# Patient Record
Sex: Female | Born: 1955 | Race: White | Hispanic: No | Smoking: Never smoker
Health system: Southern US, Community
[De-identification: ages and names within clinical notes are randomized; demographics above are authoritative.]

## PROBLEM LIST (undated history)

## (undated) DIAGNOSIS — R011 Cardiac murmur, unspecified: Secondary | ICD-10-CM

## (undated) DIAGNOSIS — L719 Rosacea, unspecified: Secondary | ICD-10-CM

## (undated) DIAGNOSIS — I7121 Aneurysm of the ascending aorta, without rupture: Secondary | ICD-10-CM

## (undated) DIAGNOSIS — I10 Essential (primary) hypertension: Secondary | ICD-10-CM

## (undated) DIAGNOSIS — Z9889 Other specified postprocedural states: Secondary | ICD-10-CM

## (undated) DIAGNOSIS — L4050 Arthropathic psoriasis, unspecified: Secondary | ICD-10-CM

## (undated) DIAGNOSIS — K219 Gastro-esophageal reflux disease without esophagitis: Secondary | ICD-10-CM

## (undated) DIAGNOSIS — G5603 Carpal tunnel syndrome, bilateral upper limbs: Secondary | ICD-10-CM

## (undated) DIAGNOSIS — I712 Thoracic aortic aneurysm, without rupture: Secondary | ICD-10-CM

## (undated) DIAGNOSIS — N6320 Unspecified lump in the left breast, unspecified quadrant: Secondary | ICD-10-CM

## (undated) DIAGNOSIS — I517 Cardiomegaly: Secondary | ICD-10-CM

## (undated) DIAGNOSIS — R112 Nausea with vomiting, unspecified: Secondary | ICD-10-CM

## (undated) DIAGNOSIS — E669 Obesity, unspecified: Secondary | ICD-10-CM

## (undated) HISTORY — DX: Rosacea, unspecified: L71.9

## (undated) HISTORY — DX: Essential (primary) hypertension: I10

## (undated) HISTORY — PX: BREAST EXCISIONAL BIOPSY: SUR124

## (undated) HISTORY — DX: Obesity, unspecified: E66.9

## (undated) HISTORY — DX: Gastro-esophageal reflux disease without esophagitis: K21.9

## (undated) HISTORY — PX: LAPAROSCOPIC ENDOMETRIOSIS FULGURATION: SUR769

## (undated) HISTORY — DX: Cardiomegaly: I51.7

## (undated) HISTORY — PX: PELVIC LAPAROSCOPY: SHX162

---

## 1998-04-05 ENCOUNTER — Ambulatory Visit (HOSPITAL_COMMUNITY): Admission: RE | Admit: 1998-04-05 | Discharge: 1998-04-05 | Payer: Self-pay | Admitting: *Deleted

## 1998-07-16 ENCOUNTER — Ambulatory Visit (HOSPITAL_COMMUNITY): Admission: RE | Admit: 1998-07-16 | Discharge: 1998-07-16 | Payer: Self-pay | Admitting: Internal Medicine

## 1999-12-17 ENCOUNTER — Other Ambulatory Visit: Admission: RE | Admit: 1999-12-17 | Discharge: 1999-12-17 | Payer: Self-pay | Admitting: Obstetrics and Gynecology

## 2000-07-02 ENCOUNTER — Encounter: Payer: Self-pay | Admitting: Internal Medicine

## 2000-07-02 ENCOUNTER — Ambulatory Visit (HOSPITAL_COMMUNITY): Admission: RE | Admit: 2000-07-02 | Discharge: 2000-07-02 | Payer: Self-pay | Admitting: Internal Medicine

## 2001-06-29 ENCOUNTER — Other Ambulatory Visit: Admission: RE | Admit: 2001-06-29 | Discharge: 2001-06-29 | Payer: Self-pay | Admitting: Obstetrics and Gynecology

## 2003-02-19 ENCOUNTER — Encounter: Payer: Self-pay | Admitting: Internal Medicine

## 2003-02-19 ENCOUNTER — Ambulatory Visit (HOSPITAL_COMMUNITY): Admission: RE | Admit: 2003-02-19 | Discharge: 2003-02-19 | Payer: Self-pay | Admitting: Internal Medicine

## 2003-02-21 ENCOUNTER — Other Ambulatory Visit: Admission: RE | Admit: 2003-02-21 | Discharge: 2003-02-21 | Payer: Self-pay | Admitting: Obstetrics and Gynecology

## 2004-02-11 ENCOUNTER — Other Ambulatory Visit: Admission: RE | Admit: 2004-02-11 | Discharge: 2004-02-11 | Payer: Self-pay | Admitting: Obstetrics and Gynecology

## 2005-03-19 ENCOUNTER — Ambulatory Visit (HOSPITAL_COMMUNITY): Admission: RE | Admit: 2005-03-19 | Discharge: 2005-03-19 | Payer: Self-pay | Admitting: Obstetrics and Gynecology

## 2006-08-10 ENCOUNTER — Ambulatory Visit (HOSPITAL_COMMUNITY): Admission: RE | Admit: 2006-08-10 | Discharge: 2006-08-10 | Payer: Self-pay | Admitting: Obstetrics and Gynecology

## 2006-08-23 ENCOUNTER — Other Ambulatory Visit: Admission: RE | Admit: 2006-08-23 | Discharge: 2006-08-23 | Payer: Self-pay | Admitting: Obstetrics and Gynecology

## 2008-07-02 ENCOUNTER — Ambulatory Visit (HOSPITAL_COMMUNITY): Admission: RE | Admit: 2008-07-02 | Discharge: 2008-07-02 | Payer: Self-pay | Admitting: Obstetrics and Gynecology

## 2009-01-22 ENCOUNTER — Encounter (INDEPENDENT_AMBULATORY_CARE_PROVIDER_SITE_OTHER): Payer: Self-pay | Admitting: *Deleted

## 2009-02-11 ENCOUNTER — Ambulatory Visit: Payer: Self-pay | Admitting: Family Medicine

## 2009-02-11 DIAGNOSIS — R519 Headache, unspecified: Secondary | ICD-10-CM | POA: Insufficient documentation

## 2009-02-11 DIAGNOSIS — R51 Headache: Secondary | ICD-10-CM | POA: Insufficient documentation

## 2009-02-11 DIAGNOSIS — R109 Unspecified abdominal pain: Secondary | ICD-10-CM | POA: Insufficient documentation

## 2009-02-11 DIAGNOSIS — J309 Allergic rhinitis, unspecified: Secondary | ICD-10-CM | POA: Insufficient documentation

## 2009-02-14 ENCOUNTER — Telehealth (INDEPENDENT_AMBULATORY_CARE_PROVIDER_SITE_OTHER): Payer: Self-pay | Admitting: *Deleted

## 2009-02-14 LAB — CONVERTED CEMR LAB
ALT: 43 units/L — ABNORMAL HIGH (ref 0–35)
AST: 40 units/L — ABNORMAL HIGH (ref 0–37)
Albumin: 3.9 g/dL (ref 3.5–5.2)
Alkaline Phosphatase: 89 units/L (ref 39–117)
BUN: 17 mg/dL (ref 6–23)
Basophils Absolute: 0 10*3/uL (ref 0.0–0.1)
Basophils Relative: 0.1 % (ref 0.0–3.0)
Bilirubin, Direct: 0.1 mg/dL (ref 0.0–0.3)
CO2: 30 meq/L (ref 19–32)
Calcium: 9.3 mg/dL (ref 8.4–10.5)
Chloride: 108 meq/L (ref 96–112)
Cholesterol: 167 mg/dL (ref 0–200)
Creatinine, Ser: 0.7 mg/dL (ref 0.4–1.2)
Eosinophils Absolute: 0.5 10*3/uL (ref 0.0–0.7)
Eosinophils Relative: 6.5 % — ABNORMAL HIGH (ref 0.0–5.0)
GFR calc non Af Amer: 93.1 mL/min (ref >60)
Glucose, Bld: 88 mg/dL (ref 70–99)
HCT: 37.5 % (ref 36.0–46.0)
HDL: 47.7 mg/dL (ref >39.00)
Hemoglobin: 13 g/dL (ref 12.0–15.0)
LDL Cholesterol: 108 mg/dL — ABNORMAL HIGH (ref 0–99)
Lymphocytes Relative: 29 % (ref 12.0–46.0)
Lymphs Abs: 2.1 10*3/uL (ref 0.7–4.0)
MCHC: 34.7 g/dL (ref 30.0–36.0)
MCV: 90 fL (ref 78.0–100.0)
Monocytes Absolute: 0.5 10*3/uL (ref 0.1–1.0)
Monocytes Relative: 6.3 % (ref 3.0–12.0)
Neutro Abs: 4.1 10*3/uL (ref 1.4–7.7)
Neutrophils Relative %: 58.1 % (ref 43.0–77.0)
Platelets: 262 10*3/uL (ref 150.0–400.0)
Potassium: 4 meq/L (ref 3.5–5.1)
RBC: 4.17 M/uL (ref 3.87–5.11)
RDW: 13.2 % (ref 11.5–14.6)
Sodium: 143 meq/L (ref 135–145)
TSH: 2.98 microintl units/mL (ref 0.35–5.50)
Total Bilirubin: 0.8 mg/dL (ref 0.3–1.2)
Total CHOL/HDL Ratio: 4
Total Protein: 7.7 g/dL (ref 6.0–8.3)
Triglycerides: 59 mg/dL (ref 0.0–149.0)
VLDL: 11.8 mg/dL (ref 0.0–40.0)
WBC: 7.2 10*3/uL (ref 4.5–10.5)

## 2009-03-14 ENCOUNTER — Ambulatory Visit: Payer: Self-pay | Admitting: Family Medicine

## 2009-03-14 DIAGNOSIS — R7402 Elevation of levels of lactic acid dehydrogenase (LDH): Secondary | ICD-10-CM | POA: Insufficient documentation

## 2009-03-14 DIAGNOSIS — R740 Nonspecific elevation of levels of transaminase and lactic acid dehydrogenase [LDH]: Secondary | ICD-10-CM

## 2009-03-14 DIAGNOSIS — R7401 Elevation of levels of liver transaminase levels: Secondary | ICD-10-CM | POA: Insufficient documentation

## 2009-03-25 ENCOUNTER — Ambulatory Visit: Payer: Self-pay | Admitting: Internal Medicine

## 2009-03-25 DIAGNOSIS — M545 Low back pain, unspecified: Secondary | ICD-10-CM | POA: Insufficient documentation

## 2009-04-04 ENCOUNTER — Encounter: Payer: Self-pay | Admitting: Family Medicine

## 2009-05-15 ENCOUNTER — Ambulatory Visit: Payer: Self-pay | Admitting: Family Medicine

## 2009-05-15 LAB — CONVERTED CEMR LAB
ALT: 39 units/L — ABNORMAL HIGH (ref 0–35)
AST: 38 units/L — ABNORMAL HIGH (ref 0–37)
Albumin: 3.7 g/dL (ref 3.5–5.2)
Alkaline Phosphatase: 86 units/L (ref 39–117)
Bilirubin, Direct: 0.1 mg/dL (ref 0.0–0.3)
Total Bilirubin: 0.7 mg/dL (ref 0.3–1.2)
Total Protein: 7.5 g/dL (ref 6.0–8.3)

## 2009-05-16 ENCOUNTER — Encounter (INDEPENDENT_AMBULATORY_CARE_PROVIDER_SITE_OTHER): Payer: Self-pay | Admitting: *Deleted

## 2009-05-22 ENCOUNTER — Ambulatory Visit: Payer: Self-pay | Admitting: Internal Medicine

## 2009-06-06 ENCOUNTER — Encounter: Payer: Self-pay | Admitting: Internal Medicine

## 2009-06-06 ENCOUNTER — Ambulatory Visit: Payer: Self-pay | Admitting: Internal Medicine

## 2009-06-06 LAB — HM COLONOSCOPY

## 2009-06-07 ENCOUNTER — Encounter: Payer: Self-pay | Admitting: Internal Medicine

## 2010-04-01 ENCOUNTER — Telehealth (INDEPENDENT_AMBULATORY_CARE_PROVIDER_SITE_OTHER): Payer: Self-pay | Admitting: *Deleted

## 2010-05-01 ENCOUNTER — Ambulatory Visit: Payer: Self-pay | Admitting: Family Medicine

## 2010-05-01 DIAGNOSIS — I10 Essential (primary) hypertension: Secondary | ICD-10-CM | POA: Insufficient documentation

## 2010-05-06 LAB — CONVERTED CEMR LAB
BUN: 19 mg/dL (ref 6–23)
CO2: 28 meq/L (ref 19–32)
Calcium: 9.3 mg/dL (ref 8.4–10.5)
Chloride: 105 meq/L (ref 96–112)
Creatinine, Ser: 0.9 mg/dL (ref 0.4–1.2)
GFR calc non Af Amer: 72.1 mL/min (ref >60)
Glucose, Bld: 131 mg/dL — ABNORMAL HIGH (ref 70–99)
Potassium: 4.1 meq/L (ref 3.5–5.1)
Sodium: 141 meq/L (ref 135–145)

## 2010-06-11 ENCOUNTER — Ambulatory Visit: Payer: Self-pay | Admitting: Family Medicine

## 2010-06-11 LAB — CONVERTED CEMR LAB
ALT: 39 units/L — ABNORMAL HIGH (ref 0–35)
AST: 33 units/L (ref 0–37)
Albumin: 3.8 g/dL (ref 3.5–5.2)
Alkaline Phosphatase: 75 units/L (ref 39–117)
BUN: 18 mg/dL (ref 6–23)
Basophils Absolute: 0.1 10*3/uL (ref 0.0–0.1)
Basophils Relative: 0.9 % (ref 0.0–3.0)
Bilirubin, Direct: 0.1 mg/dL (ref 0.0–0.3)
CO2: 27 meq/L (ref 19–32)
Calcium: 9.6 mg/dL (ref 8.4–10.5)
Chloride: 110 meq/L (ref 96–112)
Cholesterol: 161 mg/dL (ref 0–200)
Creatinine, Ser: 0.8 mg/dL (ref 0.4–1.2)
Eosinophils Absolute: 0.2 10*3/uL (ref 0.0–0.7)
Eosinophils Relative: 3.7 % (ref 0.0–5.0)
GFR calc non Af Amer: 82.98 mL/min (ref >60)
Glucose, Bld: 100 mg/dL — ABNORMAL HIGH (ref 70–99)
HCT: 37.2 % (ref 36.0–46.0)
HDL: 39.9 mg/dL (ref >39.00)
Hemoglobin: 12.8 g/dL (ref 12.0–15.0)
LDL Cholesterol: 106 mg/dL — ABNORMAL HIGH (ref 0–99)
Lymphocytes Relative: 21.7 % (ref 12.0–46.0)
Lymphs Abs: 1.5 10*3/uL (ref 0.7–4.0)
MCHC: 34.6 g/dL (ref 30.0–36.0)
MCV: 91.7 fL (ref 78.0–100.0)
Monocytes Absolute: 0.4 10*3/uL (ref 0.1–1.0)
Monocytes Relative: 6.7 % (ref 3.0–12.0)
Neutro Abs: 4.5 10*3/uL (ref 1.4–7.7)
Neutrophils Relative %: 67 % (ref 43.0–77.0)
Platelets: 252 10*3/uL (ref 150.0–400.0)
Potassium: 4.6 meq/L (ref 3.5–5.1)
RBC: 4.05 M/uL (ref 3.87–5.11)
RDW: 13.8 % (ref 11.5–14.6)
Sodium: 143 meq/L (ref 135–145)
TSH: 2.12 microintl units/mL (ref 0.35–5.50)
Total Bilirubin: 0.4 mg/dL (ref 0.3–1.2)
Total CHOL/HDL Ratio: 4
Total Protein: 6.9 g/dL (ref 6.0–8.3)
Triglycerides: 76 mg/dL (ref 0.0–149.0)
VLDL: 15.2 mg/dL (ref 0.0–40.0)
WBC: 6.7 10*3/uL (ref 4.5–10.5)

## 2010-06-12 LAB — CONVERTED CEMR LAB: Vit D, 25-Hydroxy: 33 ng/mL (ref 30–89)

## 2010-10-30 NOTE — Progress Notes (Signed)
Summary: fluticasone refill   Phone Note Refill Request Call back at [phone] Message from:  Pharmacy on April 01, 2010 4:21 PM  Refills Requested: Medication #1:  FLUTICASONE PROPIONATE 50 MCG/ACT  SUSP 2 sprays each nostril once daily   Dosage confirmed as above?Dosage Confirmed   Supply Requested: 1 month [facility]  Next Appointment Scheduled: NONE Initial call taken by: Lavell Islam,  April 01, 2010 4:22 PM    Prescriptions: FLUTICASONE PROPIONATE 50 MCG/ACT  SUSP (FLUTICASONE PROPIONATE) 2 sprays each nostril once daily  #1 x 3   Entered by:   Doristine Devoid   Authorized by:   Neena Rhymes MD   Signed by:   Doristine Devoid on 04/01/2010   Method used:   Electronically to        [facility]* (retail)       [address]       [address] Shipping/mailing       [place], [place]  [address]       Ph: [phone]       Fax: [phone]   RxID:   [id number]

## 2010-10-30 NOTE — Assessment & Plan Note (Signed)
Summary: ONE MTH FU ON BP/KDC   Vital Signs:  Patient profile:   55 year old female Weight:      299.4 pounds Pulse rate:   62 / minute BP sitting:   134 / 82  (left arm)  Vitals Entered By: Doristine Devoid (May 15, 2009 9:47 AM) CC: 1 month roa    History of Present Illness: 55 yo woman here today for f/u on elevated BP.  has not been checking BP at home.  has started exercising regularly- 'i feel better but haven't lost any weight'.  no CP, SOB, HAs, visual changes, edema.  elevated LFTs- due for recheck.  Allergies (verified): No Known Drug Allergies  Review of Systems      See HPI  Physical Exam  General:  in no acute distress; alert,appropriate and cooperative throughout examination Neck:  No deformities, masses, or tenderness noted. Lungs:  Normal respiratory effort, chest expands symmetrically. Lungs are clear to auscultation, no crackles or wheezes. Heart:  Normal rate and regular rhythm. S1 and S2 normal without gallop, murmur, click, rub or other extra sounds. Pulses:  +2 carotid, radial, DP Extremities:  No clubbing, cyanosis, edema    Impression & Recommendations:  Problem # 1:  ELEVATED BP W/O HYPERTENSION (ICD-796.2) Assessment Improved BP improved over last 2 visits.  pt again would like to hold off on meds if possible.  pt to check BP at home and report if consistently higher than 140/90.  Pt expresses understanding and is in agreement w/ this plan.  Problem # 2:  NONSPEC ELEVATION OF LEVELS OF TRANSAMINASE/LDH (ICD-790.4) Assessment: Unchanged recheck LFTs. Orders: Venipuncture (14782) TLB-Hepatic/Liver Function Pnl (80076-HEPATIC)  Complete Medication List: 1)  Westcort 0.2 % Oint (Hydrocortisone valerate) .... As needed 2)  Pepcid 20 Mg Tabs (Famotidine) .... Take 1 tab once daily 3)  Claritin 10 Mg Tabs (Loratadine) .... Use as directed 4)  Centrum Silver Tabs (Multiple vitamins-minerals) .... Use as directed 5)  Fluticasone Propionate 50  Mcg/act Susp (Fluticasone propionate) .... 2 sprays each nostril once daily 6)  Dicyclomine Hcl 20 Mg Tabs (Dicyclomine hcl) .Marland Kitchen.. 1 tab by mouth three times a day as needed for cramping  Patient Instructions: 1)  Please schedule a follow-up appointment in 3 months to recheck blood pressure 2)  Keep up the good work on diet and exercise- this will definitely pay off!!! 3)  Please check your BP  ~2x/week and record this- if consistently higher 140/90 call me! 4)  Call with any questions or concerns 5)  Happy Labor Day!

## 2010-10-30 NOTE — Consult Note (Signed)
Summary: [facility]   Imported By: Lanelle Bal 04/09/2009 13:18:21  _____________________________________________________________________  External Attachment:    Type:   Image     Comment:   External Document

## 2010-10-30 NOTE — Assessment & Plan Note (Signed)
Summary: rto bp was up at work/cbs   Vital Signs:  Patient profile:   55 year old female Height:      66 inches Weight:      313 pounds BMI:     50.70 Pulse rate:   94 / minute BP sitting:   160 / 100  (left arm)  Vitals Entered By: Doristine Devoid CMA (May 01, 2010 1:10 PM) CC: elevated bp has been around 150's/100    History of Present Illness: 55 yo woman here today for elevated BP.  BP has been running 130-160/90-100 throughout the year.  no CP.  + SOB w/ exertion- somewhat related to deconditioning.  no visual changes, HAs.  + edema of LEs in the evening.  BP- pain started after 9 hr car ride over the weekend.  taking 800mg  ibuprofen three times a day w/ improvement.  has numbness down L leg, more than baseline but improving.  pain started L side, traveled to R and is now centered.  can feel a 'pull' when she touches chin to chest.  not keeping her awake at night but does have AM stiffness.  took muscle relaxers that husband had w/out relief.  good relief w/ heat.  Current Medications (verified): 1)  Westcort 0.2 % Oint (Hydrocortisone Valerate) .... As Needed 2)  Pepcid 20 Mg Tabs (Famotidine) .... Take 1 Tab Once Daily 3)  Claritin 10 Mg Tabs (Loratadine) .... Use As Directed 4)  Centrum Silver  Tabs (Multiple Vitamins-Minerals) .... Use As Directed 5)  Fluticasone Propionate 50 Mcg/act  Susp (Fluticasone Propionate) .... 2 Sprays Each Nostril Once Daily 6)  Dicyclomine Hcl 20 Mg Tabs (Dicyclomine Hcl) .Marland Kitchen.. 1 Tab By Mouth Three Times A Day As Needed For Cramping 7)  Ibuprofen 800 Mg Tabs (Ibuprofen) .... 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  obese Lungs:  Normal respiratory effort, chest expands symmetrically. Lungs are clear to auscultation, no crackles or wheezes. Heart:  Normal rate and regular rhythm. S1 and S2 normal without gallop, murmur, click, rub or other extra sounds. Msk:  + lumbar pain, no pain w/ flexion to 45 degrees, good extension.  (-) SLR Pulses:  +2 carotid, radial, DP Extremities:  No clubbing, cyanosis, edema    Impression & Recommendations:  Problem # 1:  HYPERTENSION, BENIGN ESSENTIAL (ICD-401.1) Assessment New pt officially hypertensive.  start ACE and check BMP.  stressed importance of limited Na, regular exercise.  will follow closely. Her updated medication list for this problem includes:    Lisinopril 10 Mg Tabs (Lisinopril) .Marland Kitchen... Take 1 tab by mouth daily  Orders: Venipuncture (57322) TLB-BMP (Basic Metabolic Panel-BMET) (80048-METABOL) Specimen Handling (02542)  Problem # 2:  LOW BACK PAIN, ACUTE (ICD-724.2) Assessment: Unchanged pt's pain improving since initially started.  continue scheduled NSAIDs.  no red flags on hx or PE Her updated medication list for this problem includes:    Ibuprofen 800 Mg Tabs (Ibuprofen) .Marland Kitchen... Three times a day as needed for back pain  Complete Medication List: 1)  Westcort 0.2 % Oint (Hydrocortisone valerate) .... As needed 2)  Pepcid 20 Mg Tabs (Famotidine) .... Take  1 tab once daily 3)  Claritin 10 Mg Tabs (Loratadine) .... Use as directed 4)  Centrum Silver Tabs (Multiple vitamins-minerals) .... Use as directed 5)  Fluticasone Propionate 50 Mcg/act Susp (Fluticasone propionate) .... 2 sprays each nostril once daily 6)  Dicyclomine Hcl 20 Mg Tabs (Dicyclomine hcl) .Marland Kitchen.. 1 tab by mouth three times a day as needed for cramping 7)  Ibuprofen 800 Mg Tabs (Ibuprofen) .... Three times a day as needed for back pain 8)  Lisinopril 10 Mg Tabs (Lisinopril) .... Take 1 tab by mouth daily  Patient Instructions: 1)  Follow up in 4  weeks for your complete physical- do not eat before this appt 2)  Start the Lisinopril daily 3)  Call with any questions or concerns 4)  Limit your sodium intake- this will improve your blood pressure 5)  Continue your walking- this will help your back and the blood pressure 6)  Continue the ibuprofen as needed.  Call if your pain worsens or symptoms change 7)  Hang in there!! Prescriptions: LISINOPRIL 10 MG  TABS (LISINOPRIL) Take 1 tab by mouth daily  #30 x 3   Entered and Authorized by:   Neena Rhymes MD   Signed by:   Neena Rhymes MD on 05/01/2010   Method used:   Electronically to        [facility]* (retail)       [address]       [address] Shipping/mailing       [place], [place]  [address]       Ph: [phone]       Fax: [phone]   RxID:   [id number]

## 2010-10-30 NOTE — Procedures (Signed)
Summary: Colonoscopy   Colonoscopy  Procedure date:  06/06/2009  Findings:      Location:  [facility].    Procedures Next Due Date:    Colonoscopy: 05/2014 COLONOSCOPY PROCEDURE REPORT  PATIENT:  Leonia, Heatherly  MR#:  [id number] BIRTHDATE:   1955/12/08, 53 yrs. old   GENDER:   female  ENDOSCOPIST:   Wilhemina Bonito. Eda Keys, MD Referred by: Helane Rima. Beverely Low, M.D.  PROCEDURE DATE:  06/06/2009 PROCEDURE:  Colonoscopy with snare polypectomy ASA CLASS:   Class II INDICATIONS: Routine Risk Screening   MEDICATIONS:    Fentanyl 100 mcg IV, Versed 10 mg IV, Benadryl 25 mg IV  DESCRIPTION OF PROCEDURE:   After the risks benefits and alternatives of the procedure were thoroughly explained, informed consent was obtained.  Digital rectal exam was performed and revealed small external hemorrhoids.   The LB CF-[id number] endoscope was introduced through the anus and advanced to the cecum, which was identified by both the appendix and ileocecal valve, without limitations. TIME TO CECUM = 4:05 MIN. The quality of the prep was excellent, using MoviPrep.  The instrument was then withdrawn (TIME = 19:04 MIN) as the colon was fully examined. <<PROCEDUREIMAGES>>            <<OLD IMAGES>>  FINDINGS:  Four polyps were found in the rectum and sigmoid colon. Polyps were snared without cautery. Retrieval was successful. snare polyp  Moderate diverticulosis was found in the sigmoid colon.   Retroflexed views in the rectum revealed internal hemorrhoids.    The scope was then withdrawn from the patient and the procedure completed.  COMPLICATIONS:   None  ENDOSCOPIC IMPRESSION:  1) Four polyps in the rectum and sigmoid colon  2) Moderate diverticulosis in the sigmoid colon  3) Internal hemorrhoids RECOMMENDATIONS:  1) Repeat colonoscopy in 5 years if polyp adenomatous; otherwise 10 years    _______________________________ Wilhemina Bonito. Eda Keys, MD  CC: Sheliah Hatch, MD;   The Patient     REPORT OF SURGICAL PATHOLOGY   Case #: [id number] Patient Name: GRAZIELLA, CONNERY Office Chart Number:  N/A [id number] MRN: [id number] Pathologist: Beulah Gandy. Luisa Hart, MD DOB/Age  30-Jul-1956 (Age: 22)    Gender: F Date Taken:  06/06/2009 Date Received: 06/06/2009   FINAL DIAGNOSIS   ***MICROSCOPIC EXAMINATION AND DIAGNOSIS***   COLON, TRANSVERSE, SIGMOID AND RECTAL POLYPS:  TUBULAR ADENOMA AND THREE HYPERPLASTIC POLYPS.  NO HIGH GRADE DYSPLASIA OR MALIGNANCY IDENTIFIED.   gdt Date Reported:  06/07/2009     Beulah Gandy. Luisa Hart, MD *** Electronically Signed Out By JDP ***    June 07, 2009 MRN: [id number]    [facility] [address] [place], [place]  [address]    Dear Ms. Doyne Keel,  I am pleased to inform you that the colon polyp(s) removed during your recent colonoscopy was (were) found to be benign (no cancer detected) upon pathologic examination.  I recommend you have a repeat colonoscopy examination in 5 years to look for recurrent polyps, as having colon polyps increases your risk for having recurrent polyps or even colon cancer in the future.  Should you develop new or worsening symptoms of abdominal pain, bowel habit changes or bleeding from the rectum or bowels, please schedule an evaluation with either your primary care physician or with me.  Additional information/recommendations:    __ No further action with gastroenterology is needed at this time. Please      follow-up with your primary care physician for your other healthcare  needs.    Please call us if you are having persistent problems or have questions about your condition that have not been fully answered at this time.  Sincerely,  Hilarie Fredrickson MD  This letter has been electronically signed by your physician.   Signed by Hilarie Fredrickson MD on 06/07/2009 at 3:38 PM  This report was created from the original endoscopy report, which was reviewed and signed by  the above listed endoscopist.

## 2010-10-30 NOTE — Progress Notes (Signed)
Summary: discuss labs  Phone Note Outgoing Call   Call placed by: Doristine Devoid,  Feb 14, 2009 4:41 PM Call placed to: Patient Summary of Call: cholesterol and glucose looks good.  LFTs mildly elevated- will recheck in 2-3 weeks to trend.  no reason for concern.  Follow-up for Phone Call        left msg to have patient return call........Marland KitchenDoristine Devoid  Feb 14, 2009 4:41 PM   left message on machine ..............Marland KitchenDoristine Devoid  Feb 19, 2009 9:45 AM   patient aware of results ..........Marland KitchenDoristine Devoid  Feb 22, 2009 9:33 AM

## 2010-10-30 NOTE — Letter (Signed)
Summary: Patient Notice- Polyp Results  [facility]  [address] [place], [place] [address]   Phone: [phone]  Fax: [phone]        June 07, 2009 MRN: [id number]    [facility] [address] [place], [place]  [address]    Dear Ms. Doyne Keel,  I am pleased to inform you that the colon polyp(s) removed during your recent colonoscopy was (were) found to be benign (no cancer detected) upon pathologic examination.  I recommend you have a repeat colonoscopy examination in 5 years to look for recurrent polyps, as having colon polyps increases your risk for having recurrent polyps or even colon cancer in the future.  Should you develop new or worsening symptoms of abdominal pain, bowel habit changes or bleeding from the rectum or bowels, please schedule an evaluation with either your primary care physician or with me.  Additional information/recommendations:    __ No further action with gastroenterology is needed at this time. Please      follow-up with your primary care physician for your other healthcare      needs.    Please call us if you are having persistent problems or have questions about your condition that have not been fully answered at this time.  Sincerely,  Hilarie Fredrickson MD  This letter has been electronically signed by your physician.

## 2010-10-30 NOTE — Miscellaneous (Signed)
Summary: [facility] Previsit/perp  Clinical Lists Changes  Medications: Added new medication of MOVIPREP 100 GM  SOLR (PEG-KCL-NACL-NASULF-NA ASC-C) As per prep instructions. - Signed Rx of MOVIPREP 100 GM  SOLR (PEG-KCL-NACL-NASULF-NA ASC-C) As per prep instructions.;  #1 x 0;  Signed;  Entered by: Wyona Almas RN;  Authorized by: Hilarie Fredrickson MD;  Method used: Electronically to [facility] [address] Shipping/mail[address], [place], [place]  [address], Ph: [phone], Fax: [phone] Observations: Added new observation of NKA: T (05/22/2009 12:57)    Prescriptions: MOVIPREP 100 GM  SOLR (PEG-KCL-NACL-NASULF-NA ASC-C) As per prep instructions.  #1 x 0   Entered by:   Wyona Almas RN   Authorized by:   Hilarie Fredrickson MD   Signed by:   Wyona Almas RN on 05/22/2009   Method used:   Electronically to        [facility]* (retail)       [address]       [address] Shipping/mailing       [place], [place]  [address]       Ph: [phone]       Fax: [phone]   RxID:   [id number]

## 2010-10-30 NOTE — Letter (Signed)
Summary: Results Follow up Letter  [facility]  [address] [place], [place] [address]   Phone: [phone]  Fax: [phone]    05/16/2009 MRN: [id number]  [facility] [address] [place], [place]  [address]  Dear Virginia Matthews,  The following are the results of your recent test(s):  Test         Result    Pap Smear:        Normal _____  Not Normal _____ Comments: ______________________________________________________ Cholesterol: LDL(Bad cholesterol):         Your goal is less than:         HDL (Good cholesterol):       Your goal is more than: Comments:  ______________________________________________________ Mammogram:        Normal _____  Not Normal _____ Comments:  ___________________________________________________________________ Hemoccult:        Normal _____  Not normal _______ Comments:    _____________________________________________________________________ Other Tests: PLEASE SEE ATTACHED LABS DONE ON 05/15/2009    We routinely do not discuss normal results over the telephone.  If you desire a copy of the results, or you have any questions about this information we can discuss them at your next office visit.   Sincerely,

## 2010-10-30 NOTE — Assessment & Plan Note (Signed)
Summary: NEW TO EST/ALR   Vital Signs:  Patient profile:   55 year old female Height:      66 inches Weight:      302 pounds Pulse rate:   80 / minute Resp:     18 per minute BP sitting:   150 / 100  (left arm)  Vitals Entered By: Jeremy Johann CMA (Feb 11, 2009 10:23 AM) CC: cpx,fasting   History of Present Illness: 55 yo woman here today to establish care.  previously went to [facility]- last visit 2+ yrs ago.  GYN- [facility].  elevated BP- pt has never been dx'd w/ HTN in past.  reports this is highest it has been.  does report recent HAs.  not sure if they are BP related. denies CP, SOB, edema, visual changes.  HAs- HAs occur in back of head or behind eyes.  will occasionally wake pt from sleep.  occuring 1-2 times/month.  now somewhat more frequent since allergy season.  no photo or phonophobia.  no nausea or vomiting.  HAs will improve w/ tylenol or ibuprofen.  obesity- pt morbidly obese.  not exercising regularly, not watching diet.  wants to make a change.  allergies- pt using OTC claritin or zyrtec sparingly.  not currently using nasal spray.  occasional eye sxs.  abd pain- pt reports intermittant abd spasm followed by loose stools.  once relieved by BMs cramps are gone.  pt has struggled w/ regular bowels for 'awhile'.  no nausea or vomiting.  no relation to food.      Preventive Screening-Counseling & Management     Alcohol drinks/day: 0     Smoking Status: never     Caffeine use/day: 2 a day     Does Patient Exercise: no      Drug Use:  no.    Allergies (verified): No Known Drug Allergies  Past History:  Past Medical History:    PCO    2 ruptured disks in back L5/S1  Past Surgical History:    ex-lap for endometriosis    large mole removal on hip  Family History:    mom- died at age 4 from breast CA    dad- lymphoma, prostate cancer, CAD, HTN    2 brothers- 1 w/ prostate CA, 1 w/ asthma.  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  The patient denies anorexia, fever, weight loss, vision loss, decreased hearing, hoarseness, chest pain, syncope, dyspnea on exertion, peripheral edema, prolonged cough, abdominal pain, melena, severe indigestion/heartburn, hematuria, muscle weakness, suspicious skin lesions, depression, abnormal bleeding, enlarged lymph nodes, and breast masses.         excessive eye tearing HAs- last 6-12 months more frequent HAs, waking pt up from sleep, <1x/month. blood in stool from hemorrhoids  Physical Exam  General:  obese, NAD Head:  Normocephalic and atraumatic without obvious abnormalities. No apparent alopecia or balding. Eyes:  mild inflammation, some tearing Ears:  retracted TMs bilaterally Nose:  edematous turbinates Mouth:  + PND, otherwise WNL Neck:  No deformities, masses,  or tenderness noted. Breasts:  deferred to gyn Lungs:  Normal respiratory effort, chest expands symmetrically. Lungs are clear to auscultation, no crackles or wheezes. Heart:  Normal rate and regular rhythm. S1 and S2 normal without gallop, murmur, click, rub or other extra sounds. Abdomen:  obese, soft, NT/ND, +BS Genitalia:  deferred to gyn Msk:  No deformity or scoliosis noted of thoracic or lumbar spine.   Pulses:  +2 carotid, radial, DP Extremities:  no C/C/E Neurologic:  No cranial nerve deficits noted. Station and gait are normal. Plantar reflexes are down-going bilaterally. DTRs are symmetrical throughout. Sensory, motor and coordinative functions appear intact. Skin:  Intact without suspicious lesions or rashes Cervical Nodes:  No lymphadenopathy noted Psych:  Cognition and judgment appear intact. Alert and  cooperative with normal attention span and concentration. No apparent delusions, illusions, hallucinations   Impression & Recommendations:  Problem # 1:  HEALTHY ADULT FEMALE (ICD-V70.0) Assessment New pt's PE WNL w/ exception of morbid obesity.  see below.  labs checked.  anticipatory guidance provided. Orders: Venipuncture (62130) TLB-Lipid Panel (80061-LIPID) TLB-BMP (Basic Metabolic Panel-BMET) (80048-METABOL) TLB-CBC Platelet - w/Differential (85025-CBCD) TLB-Hepatic/Liver Function Pnl (80076-HEPATIC) TLB-TSH (Thyroid Stimulating Hormone) (84443-TSH)  Problem # 2:  OBESITY (ICD-278.00) Assessment: New stressed the importance of making changes.  will refer to nutrition.  discussed regular exercise- starting small and progressing as able.  plan is to now see pt frequently and chart progress.  Problem # 3:  ELEVATED BP W/O HYPERTENSION (ICD-796.2) Assessment: New BP elevated- recheck in 3-4 weeks to establish dx of HTN and possibly start meds.  Problem # 4:  RHINITIS (ICD-477.9) Assessment: New pt w/ moderate seasonal allergies- may be cause of HAs.  start nasal steroid  and regular antihistamine use. Her updated medication list for this problem includes:    Claritin 10 Mg Tabs (Loratadine) ..... Use as directed    Fluticasone Propionate 50 Mcg/act Susp (Fluticasone propionate) .Marland Kitchen... 2 sprays each nostril once daily  Problem # 5:  HEADACHE (ICD-784.0) Assessment: New asked pt to keep HA log as she is unable to describe sxs, timing, or related sxs in any detail.  at this point, likely multifactorial- including seasonal allergies.  will revisit at future visit.  reviewed supportive care and red flags that should prompt return.  Pt expresses understanding and is in agreement w/ this plan.  Problem # 6:  ABDOMINAL PAIN, UNSPECIFIED SITE (ICD-789.00) Assessment: New pt describing sxs possibly consistent w/ IBS- abd spasm followed by loose stool and then relief.  start anti-spasmodic  and see if pt finds relief.  stressed importance of high fiber diet and increased water intake.  Pt expresses understanding and is in agreement w/ this plan.  Complete Medication List: 1)  Westcort 0.2 % Oint (Hydrocortisone valerate) .... As needed 2)  Pepcid 20 Mg Tabs (Famotidine) .... Take 1 tab once daily 3)  Claritin 10 Mg Tabs (Loratadine) .... Use as directed 4)  Centrum Silver Tabs (Multiple vitamins-minerals) .... Use as directed 5)  Fluticasone Propionate 50 Mcg/act Susp (Fluticasone propionate) .... 2 sprays each nostril once daily 6)  Dicyclomine Hcl 20 Mg Tabs (Dicyclomine hcl) .Marland Kitchen.. 1 tab by mouth three times a day as needed for cramping  Patient Instructions: 1)  Please schedule a follow-up appointment in 3-4 weeks to recheck BP 2)  We'll notify you of your lab results 3)  Someone will call you with your nutrition referral 4)  Try and make regular exercise part of your routine 5)  Take the  dicyclomine as needed for the abdominal spasm 6)  Use the nasal spray as directed to decrease nasal congestion 7)  Continue OTC Claritin or Zyrtec for watery eyes 8)  Keep a headache diary to see if you can link your symptoms to anything 9)  Call with any questions or concerns 10)  Hang in there!  We'll get you where you want to be! Prescriptions: DICYCLOMINE HCL 20 MG TABS (DICYCLOMINE HCL) 1 tab by mouth three times a day as needed for cramping  #90 x 0   Entered and Authorized by:   Neena Rhymes MD   Signed by:   Neena Rhymes MD on 02/11/2009   Method used:   Print then Give to Patient   RxID:   [id number] FLUTICASONE PROPIONATE 50 MCG/ACT  SUSP (FLUTICASONE PROPIONATE) 2 sprays each nostril once daily  #1 x 3   Entered and Authorized by:   Neena Rhymes MD   Signed by:   Neena Rhymes MD on 02/11/2009   Method used:   Print then Give to Patient   RxID:   [id number]   Appended Document: NEW TO EST/ALR    Clinical Lists Changes  Orders: Added new  Referral order of Nutrition Referral (Nutrition) - Signed

## 2010-10-30 NOTE — Assessment & Plan Note (Signed)
Summary: RTO 4 WEEKS BP CHECK.CBS-CANC ON PHONE TRE  LM   Vital Signs:  Patient profile:   55 year old female Weight:      298.6 pounds BP sitting:   144 / 92  (left arm)  Vitals Entered By: Doristine Devoid (March 14, 2009 9:29 AM) CC: roa bp check    History of Present Illness: 55 yo woman here today to f/u  1) BP- remains elevated.  denies CP, SOB, dizziness, visual changes, N/V.  2) Obesity- has made big changes as of June 2nd.  has given up ice cream, started walking.  goal is 1-2 lbs/week.  3) GI Distress- much improved.  has not had to use Bentyl.  4) HAs- has not had one since last visit  5) elevated LFTs- mildly elevated at last visit.  pt not interested in having them rechecked today, 'i'm not sure that's necessary'.  Allergies (verified): No Known Drug Allergies  Review of Systems General:  Denies fatigue, fever, and malaise. Eyes:  Denies blurring and double vision. CV:  Denies chest pain or discomfort, fainting, fatigue, palpitations, and shortness of breath with exertion. Resp:  Denies chest discomfort, cough, and shortness of breath. GI:  Denies abdominal pain, nausea, and vomiting. Neuro:  Denies headaches.  Physical Exam  General:  obese, NAD Head:  Normocephalic and atraumatic without obvious abnormalities. No apparent alopecia or balding. Eyes:  mild inflammation, some tearing Lungs:  Normal respiratory effort, chest expands symmetrically. Lungs are clear to auscultation, no crackles or wheezes. Heart:  Normal rate and regular rhythm. S1 and S2 normal without gallop, murmur, click, rub or other extra sounds. Pulses:  +2 carotid, radial, DP Extremities:  no C/C/E   Impression & Recommendations:  Problem # 1:  ELEVATED BP W/O HYPERTENSION (ICD-796.2) Assessment Unchanged pt's BP still mildly elevated but pt very hesitant to start meds.  would prefer at least 1 more month of diet and exercise modification prior to starting meds.  pt not in any immediate  danger but did discuss the risks of untreated HTN- including MI,CVA, renal complications.  pt aware but would like to hold off.  will continue to follow.  Problem # 2:  HEADACHE (ICD-784.0) Assessment: Improved no headaches since making healthier food choices and getting regular exercise.  Problem # 3:  ABDOMINAL PAIN, UNSPECIFIED SITE (ICD-789.00) Assessment: Improved pt's abd pain much improved- has not had to use bentyl.  will follow.  Problem # 4:  OBESITY (ICD-278.00) Assessment: Unchanged pt has lost 3.5 lbs.  goal is 2 lbs/week.  pt on track for this.  has upcoming nutrition appt in July.  will continue to encourage and follow closely.  Problem # 5:  NONSPEC ELEVATION OF LEVELS OF TRANSAMINASE/LDH (ICD-790.4) Assessment: New pt's LFT's mildly elevated at last visit.  pt not interested in having these rechecked today.  Complete Medication List: 1)  Westcort 0.2 % Oint (Hydrocortisone valerate) .... As needed 2)  Pepcid 20 Mg Tabs (Famotidine) .... Take 1 tab once daily 3)  Claritin 10 Mg Tabs (Loratadine) .... Use as directed 4)  Centrum Silver Tabs (Multiple vitamins-minerals) .... Use as directed 5)  Fluticasone Propionate 50 Mcg/act Susp (Fluticasone propionate) .... 2 sprays each nostril once daily 6)  Dicyclomine Hcl 20 Mg Tabs (Dicyclomine hcl) .Marland Kitchen.. 1 tab by mouth three times a day as needed for cramping  Patient Instructions: 1)  Please schedule a follow-up appointment in 1 month to recheck BP 2)  Keep up the good work on diet  and exercise- your weight loss is right on track! 3)  Call with any questions or concerns 4)  DO NOT GIVE UP!!

## 2010-10-30 NOTE — Assessment & Plan Note (Signed)
Summary: cpx & lab/cbs   Vital Signs:  Patient profile:   55 year old female Height:      67 inches Weight:      311 pounds Temp:     97.6 degrees F oral Pulse rate:   82 / minute Resp:     18 per minute BP sitting:   120 / 88  (left arm)  Vitals Entered By: Jeremy Johann CMA (June 11, 2010 8:05 AM) CC: cpx, fasting, no pap   History of Present Illness: 55 yo woman here today for CPE.  GYN- [facility]  HTN- much improved since starting Lisinopril.  denies side effects from meds.  no CP, SOB, HAs, visual changes, edema.  health maintainence- UTD on colonoscopy, overdue for pap and mammo- intends to f/u.  Preventive Screening-Counseling & Management  Alcohol-Tobacco     Alcohol drinks/day: <1     Smoking Status: never  Caffeine-Diet-Exercise     Does Patient Exercise: no      Sexual History:  currently monogamous.        Drug Use:  never.    Current Medications (verified): 1)  Westcort 0.2 % Oint (Hydrocortisone Valerate) .... As Needed 2)  Pepcid 20 Mg Tabs (Famotidine) .... Take 1 Tab Once Daily 3)  Claritin 10 Mg Tabs (Loratadine) .... Use As Directed 4)  Centrum Silver  Tabs (Multiple Vitamins-Minerals) .... Use As Directed 5)  Fluticasone Propionate 50 Mcg/act  Susp (Fluticasone Propionate) .... 2 Sprays Each Nostril Once Daily 6)  Dicyclomine Hcl 20 Mg Tabs (Dicyclomine Hcl) .Marland Kitchen.. 1 Tab By Mouth Three Times A Day As Needed For Cramping 7)  Ibuprofen 800 Mg Tabs (Ibuprofen) .... Three Times A Day As Needed For Back Pain 8)  Lisinopril 10 Mg  Tabs (Lisinopril) .... Take 1 Tab By Mouth Daily  Allergies (verified): No Known Drug Allergies  Past History:  Past Medical History: Last updated: 2009-03-02 PCO 2 ruptured disks in back L5/S1  Past Surgical History: Last updated: 03/02/09 ex-lap for endometriosis large mole removal on hip  Family History: Last updated: 02-Mar-2009 mom- died at age 81 from breast CA dad- lymphoma, prostate cancer, CAD,  HTN 2 brothers- 1 w/ prostate CA, 1 w/ asthma.  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    Physical Exam  General:  in no acute distress; alert,appropriate and cooperative throughout examination.  obese Head:  Normocephalic and atraumatic without obvious abnormalities. No apparent alopecia or balding. Eyes:  No corneal or conjunctival inflammation noted. EOMI. Perrla. Funduscopic exam benign, without hemorrhages, exudates or papilledema. Vision grossly normal. Ears:  External ear exam shows no significant lesions or deformities.  Otoscopic examination reveals clear canals, tympanic membranes are intact bilaterally without bulging, retraction, inflammation or discharge. Hearing is grossly normal bilaterally. Nose:  External nasal examination shows no deformity or inflammation. Nasal mucosa are pink and moist without lesions or exudates. Mouth:  Oral mucosa and oropharynx without lesions or exudates.  Teeth in good repair. Neck:  No deformities, masses, or tenderness  noted. Breasts:  deferred to gyn Lungs:  Normal respiratory effort, chest expands symmetrically. Lungs are clear to auscultation, no crackles or wheezes. Heart:  Normal rate and regular rhythm. S1 and S2 normal without gallop, murmur, click, rub or other extra sounds. Abdomen:  obese, soft, NT/ND, +BS Genitalia:   deferred to gyn Pulses:  +2 carotid, radial, DP Extremities:  No clubbing, cyanosis, edema  Neurologic:  No cranial nerve deficits noted. Station and gait are normal. Plantar reflexes are down-going bilaterally. DTRs are symmetrical throughout. Sensory, motor and coordinative functions appear intact. Skin:  Intact without suspicious lesions or rashes Cervical Nodes:  No lymphadenopathy noted Axillary Nodes:  No palpable lymphadenopathy Psych:  Cognition and judgment appear intact. Alert and cooperative with normal attention span and concentration. No apparent delusions, illusions, hallucinations   Impression & Recommendations:  Problem # 1:  PHYSICAL EXAMINATION (ICD-V70.0) Assessment Unchanged PE WNL w/ exception of obesity.  stressed importance of healthy diet and regular exercise.  encouraged pt to call GYN and get UTD on pap and mammogram.  check labs. Orders: TLB-CBC Platelet - w/Differential (85025-CBCD) TLB-TSH (Thyroid Stimulating Hormone) (84443-TSH) T-Vitamin D (25-Hydroxy) (16109-60454) Specimen Handling (09811)  Problem # 2:  HYPERTENSION, BENIGN ESSENTIAL (ICD-401.1) Assessment: Improved BP now well controlled.  continue meds.  recheck BMP.   Her updated medication list for this problem includes:    Lisinopril 10 Mg Tabs (Lisinopril) .Marland Kitchen... Take 1 tab by mouth daily  Orders: Venipuncture (91478) TLB-BMP (Basic Metabolic Panel-BMET) (80048-METABOL) Specimen Handling (29562)  Complete Medication List: 1)  Westcort 0.2 % Oint (Hydrocortisone valerate) .... As needed 2)  Pepcid 20 Mg Tabs (Famotidine) .... Take 1 tab once daily 3)  Claritin 10 Mg Tabs (Loratadine) .... Use as directed 4)  Centrum Silver Tabs (Multiple vitamins-minerals) .... Use as directed 5)  Fluticasone Propionate 50 Mcg/act Susp (Fluticasone propionate) .... 2 sprays each nostril once daily 6)  Dicyclomine Hcl 20 Mg Tabs (Dicyclomine hcl) .Marland Kitchen.. 1 tab by mouth three times a day as needed for  cramping 7)  Ibuprofen 800 Mg Tabs (Ibuprofen) .... Three times a day as needed for back pain 8)  Lisinopril 10 Mg Tabs (Lisinopril) .... Take 1 tab by mouth daily  Other Orders: TLB-Lipid Panel (80061-LIPID) TLB-Hepatic/Liver Function Pnl (80076-HEPATIC)  Patient Instructions: 1)  Follow up in 6 months to recheck blood pressure 2)  We'll notify you of your lab results 3)  Try and get regular exercise- weight loss is your biggest health priority 4)  Schedule your pap and mammogram 5)  Call with any questions or concerns 6)  Good luck w/ Epic! Prescriptions: LISINOPRIL 10 MG  TABS (LISINOPRIL) Take 1 tab by mouth daily  #30 x 3   Entered and Authorized by:   Neena Rhymes MD   Signed by:   Neena Rhymes MD on 06/11/2010   Method used:   Electronically to        [facility]* (retail)       [address]       [address]/mailing       [place], [place]  [address]       Ph: [phone]       Fax: [phone]   RxID:   [id number] FLUTICASONE PROPIONATE 50 MCG/ACT  SUSP (FLUTICASONE PROPIONATE) 2 sprays each nostril once daily  #1 x 6   Entered and Authorized by:   Neena Rhymes MD   Signed by:   Neena Rhymes MD on 06/11/2010  Method used:   Electronically to        [facility]* (retail)       [address]       [address] Shipping/mailing       [place], [place]  [address]       Ph: [phone]       Fax: [phone]   RxID:   [id number]

## 2010-11-08 ENCOUNTER — Encounter: Payer: Self-pay | Admitting: Family Medicine

## 2010-11-28 ENCOUNTER — Ambulatory Visit: Payer: Self-pay | Admitting: Family Medicine

## 2010-11-28 DIAGNOSIS — Z0289 Encounter for other administrative examinations: Secondary | ICD-10-CM

## 2010-12-26 ENCOUNTER — Other Ambulatory Visit: Payer: Self-pay | Admitting: Family Medicine

## 2010-12-26 NOTE — Telephone Encounter (Signed)
#  30 sent, pt is due for follow up.

## 2011-01-19 ENCOUNTER — Ambulatory Visit (INDEPENDENT_AMBULATORY_CARE_PROVIDER_SITE_OTHER): Payer: 59 | Admitting: Family Medicine

## 2011-01-19 ENCOUNTER — Encounter: Payer: Self-pay | Admitting: Family Medicine

## 2011-01-19 DIAGNOSIS — M545 Low back pain, unspecified: Secondary | ICD-10-CM

## 2011-01-19 MED ORDER — OXYCODONE-ACETAMINOPHEN 5-325 MG PO TABS: 1.0000 | ORAL_TABLET | Freq: Four times a day (QID) | ORAL | Status: DC | PRN

## 2011-01-19 MED ORDER — CYCLOBENZAPRINE HCL 10 MG PO TABS: 10.0000 mg | ORAL_TABLET | Freq: Every day | ORAL | Status: DC

## 2011-01-19 MED ORDER — LISINOPRIL 10 MG PO TABS: 10.0000 mg | ORAL_TABLET | Freq: Every day | ORAL | Status: DC

## 2011-01-19 MED ORDER — PREDNISONE (PAK) 10 MG PO TABS: ORAL_TABLET | ORAL | Status: DC

## 2011-01-19 NOTE — Progress Notes (Signed)
  Subjective:    Patient ID: Virginia Matthews, female    DOB: 08-Jan-1956, 55 y.o.   MRN: [id number]  HPI Back pain- sxs started 7-10 days ago.  Related this to fact that knee was bothering her and she was altering her gait.  Most difficult time was transition from sitting to standing and vice versa.  4 days ago developed numbness of R foot and burning pain down the R leg.  This was worse after drive to [place] for funeral.  Unable to get comfortable to sleep.  No pain w/ sitting.  Has been tx'ing pain w/ NSAIDs, flexeril, and heat w/out relief.  Has hx of back problems for 15 yrs.  Took 2 leftover percocet w/ little relief.  Has never seen ortho for this- did see a neurosurg 'years ago' almost had surgery for something similar.   Review of Systems For ROS see HPI     Objective:   Physical Exam  Constitutional: She appears well-developed and well-nourished. No distress.       Morbidly obese  Musculoskeletal:       Lumbar back: She exhibits decreased range of motion, tenderness and spasm.       Back:       Pain w/ extension>flexion, + SLR on R at 90 degrees, (-) on L  Neurological: She has normal reflexes. No cranial nerve deficit. Coordination normal.          Assessment & Plan:

## 2011-01-19 NOTE — Patient Instructions (Signed)
Call me next week and let me know how the pain is Take the Prednisone as directed- take w/ food to avoid upset stomach Take the Percocet as needed for pain/sleep Continue the Flexeril Call with any questions or concerns Hang in there!!!

## 2011-01-19 NOTE — Assessment & Plan Note (Signed)
Pt's back pain now radicular w/ numbness of R foot.  Recommended ortho or neurosurg but pt declines at this time.  Start Predpak to improve inflammation and improve nerve fxn.  Continue muscle relaxer.  Start Percocet for pain and sleep.  Reviewed supportive care and red flags that should prompt return.  Pt expressed understanding and is in agreement w/ plan.

## 2011-01-26 ENCOUNTER — Telehealth: Payer: Self-pay | Admitting: *Deleted

## 2011-01-26 DIAGNOSIS — M545 Low back pain, unspecified: Secondary | ICD-10-CM

## 2011-01-26 NOTE — Telephone Encounter (Signed)
Pt left VM that she was calling to report that pain has improve some but she continues to have numbness in legs and toes with some weakness in legs.

## 2011-01-26 NOTE — Telephone Encounter (Signed)
Given that she's still having numbness and weakness i would again recommend seeing ortho or neurosurg.  If she wants to give it more time for the steroids to work, we can.  But only she knows how she is feeling.

## 2011-01-26 NOTE — Telephone Encounter (Signed)
Pt aware referral put in.

## 2011-02-03 ENCOUNTER — Other Ambulatory Visit (HOSPITAL_COMMUNITY): Payer: Self-pay | Admitting: Neurosurgery

## 2011-02-03 DIAGNOSIS — M5136 Other intervertebral disc degeneration, lumbar region: Secondary | ICD-10-CM

## 2011-02-04 ENCOUNTER — Other Ambulatory Visit: Payer: Self-pay | Admitting: Neurosurgery

## 2011-02-04 ENCOUNTER — Ambulatory Visit (HOSPITAL_COMMUNITY)
Admission: RE | Admit: 2011-02-04 | Discharge: 2011-02-04 | Disposition: A | Discharge status: Home / Self Care | Payer: 59 | Source: Ambulatory Visit | Attending: Neurosurgery | Admitting: Neurosurgery

## 2011-02-04 DIAGNOSIS — M47816 Spondylosis without myelopathy or radiculopathy, lumbar region: Secondary | ICD-10-CM

## 2011-02-04 DIAGNOSIS — M5136 Other intervertebral disc degeneration, lumbar region: Secondary | ICD-10-CM

## 2011-02-04 DIAGNOSIS — IMO0002 Reserved for concepts with insufficient information to code with codable children: Secondary | ICD-10-CM

## 2011-02-05 ENCOUNTER — Inpatient Hospital Stay (HOSPITAL_COMMUNITY): Admission: RE | Admit: 2011-02-05 | Payer: 59 | Source: Ambulatory Visit

## 2011-02-08 ENCOUNTER — Ambulatory Visit
Admission: RE | Admit: 2011-02-08 | Discharge: 2011-02-08 | Disposition: A | Discharge status: Home / Self Care | Payer: 59 | Source: Ambulatory Visit | Attending: Neurosurgery | Admitting: Neurosurgery

## 2011-02-08 DIAGNOSIS — IMO0002 Reserved for concepts with insufficient information to code with codable children: Secondary | ICD-10-CM

## 2011-02-08 DIAGNOSIS — M47816 Spondylosis without myelopathy or radiculopathy, lumbar region: Secondary | ICD-10-CM

## 2011-03-12 ENCOUNTER — Telehealth: Payer: Self-pay | Admitting: Cardiology

## 2011-03-12 ENCOUNTER — Inpatient Hospital Stay (HOSPITAL_COMMUNITY): Payer: 59

## 2011-03-12 ENCOUNTER — Emergency Department (HOSPITAL_BASED_OUTPATIENT_CLINIC_OR_DEPARTMENT_OTHER)
Admission: AD | Admit: 2011-03-12 | Discharge: 2011-03-12 | Disposition: A | Discharge status: Other Acute Inpatient Hospital | Payer: 59 | Source: Other Acute Inpatient Hospital | Attending: Emergency Medicine | Admitting: Emergency Medicine

## 2011-03-12 ENCOUNTER — Emergency Department (INDEPENDENT_AMBULATORY_CARE_PROVIDER_SITE_OTHER): Payer: 59

## 2011-03-12 ENCOUNTER — Observation Stay (HOSPITAL_COMMUNITY)
Admission: AD | Admit: 2011-03-12 | Discharge: 2011-03-12 | Disposition: A | Discharge status: Home / Self Care | Payer: 59 | Source: Other Acute Inpatient Hospital | Attending: Internal Medicine | Admitting: Internal Medicine

## 2011-03-12 DIAGNOSIS — R079 Chest pain, unspecified: Principal | ICD-10-CM | POA: Insufficient documentation

## 2011-03-12 DIAGNOSIS — R0789 Other chest pain: Secondary | ICD-10-CM

## 2011-03-12 DIAGNOSIS — K219 Gastro-esophageal reflux disease without esophagitis: Secondary | ICD-10-CM | POA: Insufficient documentation

## 2011-03-12 DIAGNOSIS — R0602 Shortness of breath: Secondary | ICD-10-CM | POA: Insufficient documentation

## 2011-03-12 DIAGNOSIS — M545 Low back pain, unspecified: Secondary | ICD-10-CM | POA: Insufficient documentation

## 2011-03-12 DIAGNOSIS — L408 Other psoriasis: Secondary | ICD-10-CM | POA: Insufficient documentation

## 2011-03-12 DIAGNOSIS — M25519 Pain in unspecified shoulder: Secondary | ICD-10-CM

## 2011-03-12 DIAGNOSIS — Z79899 Other long term (current) drug therapy: Secondary | ICD-10-CM | POA: Insufficient documentation

## 2011-03-12 DIAGNOSIS — I10 Essential (primary) hypertension: Secondary | ICD-10-CM | POA: Insufficient documentation

## 2011-03-12 LAB — CBC
HCT: 37.6 % (ref 36.0–46.0)
Hemoglobin: 12.6 g/dL (ref 12.0–15.0)
MCH: 30.2 pg (ref 26.0–34.0)
MCHC: 33.5 g/dL (ref 30.0–36.0)
MCV: 90.2 fL (ref 78.0–100.0)
Platelets: 256 10*3/uL (ref 150–400)
RBC: 4.17 MIL/uL (ref 3.87–5.11)
RDW: 13.6 % (ref 11.5–15.5)
WBC: 10.2 10*3/uL (ref 4.0–10.5)

## 2011-03-12 LAB — TROPONIN I
Troponin I: <0.3 ng/mL (ref <0.30)
Troponin I: <0.3 ng/mL (ref <0.30)

## 2011-03-12 LAB — DIFFERENTIAL
Basophils Absolute: 0.1 10*3/uL (ref 0.0–0.1)
Basophils Relative: 1 % (ref 0–1)
Eosinophils Absolute: 0.6 10*3/uL (ref 0.0–0.7)
Eosinophils Relative: 5 % (ref 0–5)
Lymphocytes Relative: 24 % (ref 12–46)
Lymphs Abs: 2.5 10*3/uL (ref 0.7–4.0)
Monocytes Absolute: 0.7 10*3/uL (ref 0.1–1.0)
Monocytes Relative: 7 % (ref 3–12)
Neutro Abs: 6.4 10*3/uL (ref 1.7–7.7)
Neutrophils Relative %: 62 % (ref 43–77)

## 2011-03-12 LAB — COMPREHENSIVE METABOLIC PANEL
ALT: 38 U/L — ABNORMAL HIGH (ref 0–35)
AST: 29 U/L (ref 0–37)
Albumin: 3.7 g/dL (ref 3.5–5.2)
Alkaline Phosphatase: 109 U/L (ref 39–117)
BUN: 15 mg/dL (ref 6–23)
CO2: 27 mEq/L (ref 19–32)
Calcium: 10.2 mg/dL (ref 8.4–10.5)
Chloride: 101 mEq/L (ref 96–112)
Creatinine, Ser: 0.6 mg/dL (ref 0.4–1.2)
GFR calc Af Amer: >60 mL/min (ref >60)
GFR calc non Af Amer: >60 mL/min (ref >60)
Glucose, Bld: 138 mg/dL — ABNORMAL HIGH (ref 70–99)
Potassium: 3.7 mEq/L (ref 3.5–5.1)
Sodium: 140 mEq/L (ref 135–145)
Total Bilirubin: 0.2 mg/dL — ABNORMAL LOW (ref 0.3–1.2)
Total Protein: 7.2 g/dL (ref 6.0–8.3)

## 2011-03-12 LAB — HEMOGLOBIN A1C
Hgb A1c MFr Bld: 6.2 % — ABNORMAL HIGH (ref <5.7)
Mean Plasma Glucose: 131 mg/dL — ABNORMAL HIGH (ref <117)

## 2011-03-12 LAB — D-DIMER, QUANTITATIVE (NOT AT ARMC): D-Dimer, Quant: 0.95 ug/mL-FEU — ABNORMAL HIGH (ref 0.00–0.48)

## 2011-03-12 LAB — TSH: TSH: 1.247 u[IU]/mL (ref 0.350–4.500)

## 2011-03-12 LAB — CK TOTAL AND CKMB (NOT AT ARMC)
CK, MB: 4.7 ng/mL — ABNORMAL HIGH (ref 0.3–4.0)
CK, MB: 4.5 ng/mL — ABNORMAL HIGH (ref 0.3–4.0)
Relative Index: 2.2 (ref 0.0–2.5)
Relative Index: 2.4 (ref 0.0–2.5)
Total CK: 213 U/L — ABNORMAL HIGH (ref 7–177)
Total CK: 190 U/L — ABNORMAL HIGH (ref 7–177)

## 2011-03-12 LAB — CARDIAC PANEL(CRET KIN+CKTOT+MB+TROPI)
CK, MB: 3.8 ng/mL (ref 0.3–4.0)
Relative Index: 2.4 (ref 0.0–2.5)
Total CK: 161 U/L (ref 7–177)
Troponin I: <0.3 ng/mL (ref <0.30)

## 2011-03-12 MED ORDER — IOHEXOL 300 MG/ML  SOLN
100.0000 mL | Freq: Once | INTRAMUSCULAR | Status: AC | PRN
  Administered 2011-03-12: 100 mL via INTRAVENOUS

## 2011-03-12 NOTE — Telephone Encounter (Signed)
[facility]- Patient needs a f/u appointment with Dr. Clifton James either Wednesday, Thursday, or Friday of next week.

## 2011-03-13 ENCOUNTER — Ambulatory Visit (HOSPITAL_COMMUNITY): Payer: 59 | Attending: Cardiovascular Disease | Admitting: Radiology

## 2011-03-13 DIAGNOSIS — I4949 Other premature depolarization: Secondary | ICD-10-CM

## 2011-03-13 DIAGNOSIS — R079 Chest pain, unspecified: Secondary | ICD-10-CM

## 2011-03-13 DIAGNOSIS — R0609 Other forms of dyspnea: Secondary | ICD-10-CM

## 2011-03-13 DIAGNOSIS — R0989 Other specified symptoms and signs involving the circulatory and respiratory systems: Secondary | ICD-10-CM

## 2011-03-13 DIAGNOSIS — R0602 Shortness of breath: Secondary | ICD-10-CM

## 2011-03-13 MED ORDER — TECHNETIUM TC 99M TETROFOSMIN IV KIT
33.0000 | PACK | Freq: Once | INTRAVENOUS | Status: AC | PRN
  Administered 2011-03-13: 33 via INTRAVENOUS

## 2011-03-13 NOTE — Progress Notes (Signed)
[facility] SITE 3 NUCLEAR MED [address] [place] [address] [phone]  Cardiology Nuclear Med Study  Virginia Matthews is a 55 y.o. female [id number] [birth year]   Nuclear Med Background Indication for Stress Test:  Evaluation for Ischemia and 03/12/11 Post Hospital: CP > (-) enzymes History:  6/12 CT Cardiac Risk Factors: Family History - CAD and Hypertension  Symptoms:  Chest Pain (last date of chest discomfort x2 days ago w/ radiation to Left shoulder & down L arm.), DOE, Fatigue and SOB   Nuclear Pre-Procedure Caffeine/Decaff Intake:  None NPO After: 10:00pm   Lungs:  clear IV 0.9% NS with Angio Cath:  20g  IV Site: R Antecubital  IV Started by:  Stanton Kidney, EMT-P  Chest Size (in):  42  Cup Size: DD  Height: 5\' 6"  (1.676 m)  Weight:  324 lb (146.965 kg)  BMI:  Body mass index is 52.29 kg/(m^2). Tech Comments:  NA    Nuclear Med Study 1 or 2 day study: 2 day  Stress Test Type:  Stress  Reading MD: Marca Ancona, MD  Order Authorizing Provider:  C. Clifton James, MD  Resting Radionuclide: Technetium 106m Tetrofosmin  Resting Radionuclide Dose: 33 mCi   Stress Radionuclide:  Technetium 93m Tetrofosmin  Stress Radionuclide Dose: 33 mCi           Stress Protocol Rest HR: 97 Stress HR: 151  Rest BP: 148/88 Stress BP: 182/83  Exercise Time (min): 4:01 METS: 5.9   Predicted Max HR: 166 bpm % Max HR: 90.96 bpm Rate Pressure Product: 21308   Dose of Adenosine (mg):  n/a Dose of Lexiscan: n/a mg  Dose of Atropine (mg): n/a Dose of Dobutamine: n/a mcg/kg/min (at max HR)  Stress Test Technologist: Stanton Kidney, EMT-P  Nuclear Technologist:  Domenic Polite, CNMT     Rest Procedure:  Myocardial perfusion imaging was performed at rest 45 minutes following the intravenous administration of Technetium 39m Tetrofosmin. Rest ECG: NSR - Normal EKG  Stress Procedure:  The patient exercised for 4:01.  The patient stopped due to fatigue and DOE and denied  any chest pain.  There were no significant ST-T wave changes, rare PVC during recovery.  Technetium 28m Tetrofosmin was injected at peak exercise and myocardial perfusion imaging was performed after a brief delay. Stress ECG: No significant change from baseline ECG  QPS Raw Data Images:  Normal; no motion artifact; normal heart/lung ratio. Stress Images:  Small mild apical perfusion defect.  Rest Images:  Small mild apical perfusion defect.  Subtraction (SDS):  Small fixed apical perfusion defect.  Transient Ischemic Dilatation (Normal <1.22):  .87 Lung/Heart Ratio (Normal <0.45):  .31  Quantitative Gated Spect Images QGS EDV:  119 ml QGS ESV:  47 ml QGS cine images:  Normal Wall Motion QGS EF: 61%  Impression Exercise Capacity:  Poor exercise capacity. BP Response:  Hypertensive blood pressure response. Clinical Symptoms:  Shortness of breath, no chest pain.  ECG Impression:  No significant ST segment change suggestive of ischemia. Comparison with Prior Nuclear Study: No previous nuclear study performed  Overall Impression:  Small fixed apical perfusion defect likely represents apical thinning or attenuation.  Normal EF and wall motion, poor exercise tolerance.  No evidence for ischemia or infarction.   Baylen Buckner [employer]

## 2011-03-16 ENCOUNTER — Ambulatory Visit (HOSPITAL_COMMUNITY): Payer: 59 | Attending: Cardiovascular Disease | Admitting: Radiology

## 2011-03-16 DIAGNOSIS — R079 Chest pain, unspecified: Secondary | ICD-10-CM | POA: Insufficient documentation

## 2011-03-16 MED ORDER — TECHNETIUM TC 99M TETROFOSMIN IV KIT
33.0000 | PACK | Freq: Once | INTRAVENOUS | Status: AC | PRN
  Administered 2011-03-16: 33 via INTRAVENOUS

## 2011-03-16 MED ORDER — TECHNETIUM TC 99M TETROFOSMIN IV KIT: 33.0000 | PACK | Freq: Once | INTRAVENOUS | Status: DC | PRN

## 2011-03-17 NOTE — Progress Notes (Signed)
Copy routed to Dr. MCALHANY.Falecha L Clark ° ° °

## 2011-03-19 ENCOUNTER — Encounter: Payer: Self-pay | Admitting: Cardiovascular Disease

## 2011-03-19 ENCOUNTER — Ambulatory Visit (INDEPENDENT_AMBULATORY_CARE_PROVIDER_SITE_OTHER): Payer: 59 | Admitting: Cardiovascular Disease

## 2011-03-19 DIAGNOSIS — R079 Chest pain, unspecified: Secondary | ICD-10-CM | POA: Insufficient documentation

## 2011-03-19 NOTE — Assessment & Plan Note (Signed)
Aytpical pain. Her risk factors are obesity, HTN and family history of CAD. Stress myoview low risk, no ischemia. No further workup. I have recommended weight loss, exercise and aggressive risk factor modification. She will call if she has recurrence of pain.

## 2011-03-19 NOTE — Consult Note (Signed)
NAMEMarland Kitchen  LACARA, DUNSWORTH NO.:  [id number]  MEDICAL RECORD NO.:  [id number]  LOCATION:  2010                         FACILITY:  [facility]  PHYSICIAN:  Verne Carrow, MDDATE OF BIRTH:  11-Apr-1956  DATE OF CONSULTATION:  03/12/2011 DATE OF DISCHARGE:                                CONSULTATION   REQUESTING PHYSICIAN:  Marcellus Scott, MD, of the [facility].  REASON FOR CONSULTATION:  Chest pain, rule out ACS.  HISTORY OF PRESENT ILLNESS:  [name] is a 55 year old woman who presented to the [facility] Emergency Department after awakening from sleep at approximately 1:00 p.m. with left-sided chest pain and shortness of breath.  She stated she woke up suddenly and felt the pain which scared her.  When it got worse, she became more anxious, she decided to go to the emergency department.  On the way to the ED, the pain started to get better and the shortness of breath resolved.  In the emergency department, one sublingual nitroglycerin was given which caused complete resolution of the pain.  Pain was an 8 in the upper left chest with radiation down the left shoulder and down into her arm.  It was associated with some shortness of breath but not diaphoresis.  She has never had anything like this before.  She does have risk factors for CAD including father who had CAD with 90% stenosis of the LAD at age 25. She does have hypertension and is also morbidly obese.  She does deny smoking, hyperlipidemia, and diabetes.  She is followed by Dr. Beverely Low, is at [facility] in [place].  PAST MEDICAL HISTORY: 1. Hypertension. 2. GERD. 3. Psoriasis. 4. Endometriosis.  OUTPATIENT MEDICATION LIST: 1. Lisinopril 10 mg daily. 2. Multivitamin daily. 3. Zantac 150 mg tablet once daily. 4. Claritin 10 mg tablets once daily. 5. Flonase 2 sprays per nostril daily as needed. 6. Naproxen 500 mg b.i.d. as needed.  ALLERGIES:  No known drug  allergies.  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  She is not a smoker, admits to occasional alcohol.  No illicit drug use and no regular exercise.  PAST SURGICAL HISTORY:  She had an exploratory laparotomy for endometriosis and also a large mole removed on her hip.  ADMISSION LABORATORIES:  White count was 10.2.  Hemoglobin was 12.6, hematocrit was 37.6, platelets were 256.  Sodium was 140, potassium is 3.7, chloride was 101, bicarb was 27, BUN was 15, creatinine was 0.6, blood sugar was 138.  Total bilirubin was 0.2, alkaline phosphatase 109, AST 29, ALT 38, calcium is 10.2, total protein was 7.2, albumin was 3.7.  CK-MB was 213, repeat was 190.  CK-MB was 4.7, repeat was 4.5.  Troponin was less than 0.30 x2.  PHYSICAL EXAMINATION:  VITAL SIGNS:  Temperature was 98.3, pulse was 70, respirations 18, blood pressure was 134/84, saturating 90% on room air. GENERAL:  This is an obese pleasant female, in no acute distress. CARDIOVASCULAR:  Regular rate and rhythm.  There is a 3/6 systolic ejection murmur. CHEST:  Respiration was clear to auscultation bilaterally.  No wheezes, crackles, or rhonchi.  ABDOMEN:  Soft, nontender, nondistended.  Bowel sounds are positive. EXTREMITIES:  Trace edema in the bilateral lower extremities.  ASSESSMENT AND PLAN:  Chest pain, currently ruling out for acute coronary syndrome.  An EKG shows no signs of a ST-segment elevation myocardial infarction and negative troponins x2 show acute coronary syndrome is less likely.  Pneumonia, pulmonary embolism, and aortic dissection are also possibilities but with resolution of the pain, no fever or leukocytosis and lack of hypotension make these less likely as well.  RECOMMENDATIONS:  On speaking with Dr. Clifton James, plan is for her to continue her ACS rule out and discharge later this afternoon or early a.m. if  she does continue to rule out and has no return of her chest pain.  We will arrange for an outpatient nuclear stress test at Dr. Gibson Ramp office and follow up in the office next week.  Thank you very much for this consultation.    ______________________________ Leodis Sias, MD   ______________________________ Verne Carrow, MD    CP/MEDQ  D:  03/12/2011  T:  03/12/2011  Job:  [id number]  Electronically Signed by Leodis Sias MD on 03/17/2011 07:55:35 PM Electronically Signed by Verne Carrow MD on 03/19/2011 09:01:52 AM

## 2011-03-19 NOTE — Progress Notes (Signed)
History of Present Illness:54 yo WF with history of HTN, GERD, Psoriasis here today for hospital follow up. I saw her as a new consult on 03/12/11 at [facility] for evaluation of chest pain. She was admitted on 03/11/11 after being awakened with left sided chest pain. Cardiac markers were negative. EKG was normal. D-dimer was elevated but CT angiogram negative for PE. She was discharged home and had an outpatient myoview. She exercised for 4 minutes with no evidence of ischemia, no chest pain. Images with apical thinning possibly from attenuation artifact. EF of 61%. Low risk study. She has had no recurrence of the same type of chest pain. She was digging holes this weekend and had no chest pain.   Past Medical History  Diagnosis Date  . PCO (polycystic ovaries)   . Ruptured lumbar disc     L5/S1  . Hypertension   . GERD (gastroesophageal reflux disease)     Past Surgical History  Procedure Date  . Laparoscopic endometriosis fulguration   . Mole removal     on hip    Current Outpatient Prescriptions  Medication Sig Dispense Refill  . aspirin 325 MG tablet Take 325 mg by mouth daily.        . famotidine (PEPCID) 20 MG tablet Take 20 mg by mouth. Take one tablet daily       . fluticasone (FLONASE) 50 MCG/ACT nasal spray 2 sprays by Nasal route daily.        . hydrocortisone valerate (WEST-CORT) 0.2 % ointment Apply topically. As needed       . lisinopril (PRINIVIL,ZESTRIL) 10 MG tablet Take 1 tablet (10 mg total) by mouth daily.  30 tablet  6  . loratadine (CLARITIN) 10 MG tablet Take 10 mg by mouth daily.        . Multiple Vitamins-Minerals (CENTRUM SILVER PO) Take by mouth. Use as directed       . naproxen (NAPROSYN) 500 MG tablet Take 500 mg by mouth 2 (two) times daily.        . NON FORMULARY Cream for scleriosis       . oxyCODONE-acetaminophen (ROXICET) 5-325 MG per tablet Take 1 tablet by mouth every 6 (six) hours as needed for pain.  45 tablet  0  . DISCONTD: cyclobenzaprine  (FLEXERIL) 10 MG tablet Take 1 tablet (10 mg total) by mouth at bedtime.  30 tablet  0  . DISCONTD: dicyclomine (BENTYL) 20 MG tablet Take 20 mg by mouth. Take one tablet three times a day as needed for cramping       . DISCONTD: ibuprofen (ADVIL,MOTRIN) 800 MG tablet Take 800 mg by mouth. Three times a day as needed for back pain       . DISCONTD: predniSONE (DELTASONE) 10 MG tablet pack Take 12 day pack as directed.  Take w/ food.  Disp 1 pack.  10 tablet  0    No Known Allergies  History   Social History  . Marital Status: Married    Spouse Name: N/A    Number of Children: N/A  . Years of Education: N/A   Occupational History  . Not on file.   Social History Main Topics  . Smoking status: Never Smoker   . Smokeless tobacco: Not on file  . Alcohol Use: No  . Drug Use: No  . Sexually Active:    Other Topics Concern  . Not on file   Social History Narrative  . No narrative on file  Family History  Problem Relation Age of Onset  . Cancer Mother     breast cancer  . Cancer Father     prostate cancer, lymphoma  . Heart disease Father   . Hypertension Father   . Cancer Brother     prostate cancer  . Asthma Brother     Review of Systems:  As stated in the HPI and otherwise negative.   BP 132/92  Pulse 84  Resp 14  Ht 5\' 6"  (1.676 m)  Wt 323 lb (146.512 kg)  BMI 52.13 kg/m2  Physical Examination: General: Well developed, well nourished, NAD HEENT: OP clear, mucus membranes moist SKIN: warm, dry. No rashes. Neuro: No focal deficits Musculoskeletal: Muscle strength 5/5 all ext Psychiatric: Mood and affect normal Neck: No JVD, no carotid bruits, no thyromegaly, no lymphadenopathy. Lungs:Clear bilaterally, no wheezes, rhonci, crackles Cardiovascular: Regular rate and rhythm. No murmurs, gallops or rubs. Abdomen:Soft. Bowel sounds present. Non-tender.  Extremities: No lower extremity edema. Pulses are 2 + in the bilateral DP/PT.  Stress  Myoview  03/16/11:  Stress Procedure: The patient exercised for 4:01. The patient stopped due to fatigue and DOE and denied any chest pain. There were no significant ST-T wave changes, rare PVC during recovery. Technetium 21m Tetrofosmin was injected at peak exercise and myocardial perfusion imaging was performed after a brief delay.  Stress ECG: No significant change from baseline ECG  QPS  Raw Data Images: Normal; no motion artifact; normal heart/lung ratio.  Stress Images: Small mild apical perfusion defect.  Rest Images: Small mild apical perfusion defect.  Subtraction (SDS): Small fixed apical perfusion defect.  Transient Ischemic Dilatation (Normal <1.22): .87  Lung/Heart Ratio (Normal <0.45): .31  Quantitative Gated Spect Images  QGS EDV: 119 ml  QGS ESV: 47 ml  QGS cine images: Normal Wall Motion  QGS EF: 61%  Impression  Exercise Capacity: Poor exercise capacity.  BP Response: Hypertensive blood pressure response.  Clinical Symptoms: Shortness of breath, no chest pain.  ECG Impression: No significant ST segment change suggestive of ischemia.  Comparison with Prior Nuclear Study: No previous nuclear study performed  Overall Impression: Small fixed apical perfusion defect likely represents apical thinning or attenuation. Normal EF and wall motion, poor exercise tolerance. No evidence for ischemia or infarction.

## 2011-03-19 NOTE — Progress Notes (Signed)
Will d/w pt at f/u visit today. cdm

## 2011-03-22 NOTE — H&P (Signed)
NAMEMarland Kitchen  DEBERAH, ADOLF NO.:  [id number]  MEDICAL RECORD NO.:  [id number]  LOCATION:  2010                         FACILITY:  [facility]  PHYSICIAN:  Marcellus Scott, MD     DATE OF BIRTH:  [birth year]  DATE OF ADMISSION:  03/12/2011 DATE OF DISCHARGE:                             HISTORY & PHYSICAL   PRIMARY CARE PHYSICIAN:  Neena Rhymes, MD with [facility] [place]  NEUROSURGERY:  Hewitt Shorts, MD  CHIEF COMPLAINTS:  Chest pain and difficulty breathing.  HISTORY OF PRESENT ILLNESS:  Ms. Doyne Keel is a pleasant 55 year old morbidly obese female patient with history of hypertension, endometriosis, polycystic ovarian syndrome, gastroesophageal reflux disease, and psoriasis.  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  She was in her usual state of health until approximately 12:30 last night when she was awakened by difficulty breathing.  This was not associated with wheezing or cough or fever or chills.  It was, however, associated with precordial aching type of chest discomfort with radiation to the left shoulder and arm.  She had a sense of doom.  There was no diaphoresis or lightheadedness.  This was not associated with nausea, vomiting, or epigastric burning.  She talked to her spouse and decided to go to the emergency room.  Approximately 20 minutes from the start of the event, the symptoms started subsiding but she still had some achiness in the chest.  This subsequently resolved 2 hours later after a sublingual nitroglycerin.  Since then she has had no further chest pain or dyspnea.  She indicates that she traveled to [place] by car at the end of April.  She has some bilateral leg swelling, but no asymmetrical swelling or pain.  Her work is mostly sedentary in nature. [facility] were requested to admit her for further evaluation.  PAST MEDICAL HISTORY: 1. Hypertension. 2. Murmur as  a young adult. 3. Endometriosis. 4. Polycystic ovarian syndrome. 5. Gastroesophageal reflux disease. 6. Psoriasis. 7. L5-S1 bulging disk with low back pain.  PAST SURGICAL HISTORY:  Laparoscopy.  ALLERGIES:  SEASONAL ALLERGIES but no known drug allergies.  HOME MEDICATIONS: 1. Lisinopril 10 mg p.o. daily. 2. Claritin 10 mg p.o. daily. 3. Zantac 150 mg p.o. at bedtime. 4. Flonase 2 sprays per nostril at bedtime. 5. Multivitamins 1 p.o. daily. 6. Cortisone cream and vitamin D cream.  FAMILY HISTORY: 1. The patient's father had a coronary artery bypass graft at age 79     and subsequently 8 stents.  He is alive and is 55 years old.  He     also has prostate cancer and lymphoma. 2. The patient's 1 brother has prostate cancer. 3. The patient's next brother has asthma. 4. The patient's mother died at age 40 from breast cancer.  SOCIAL HISTORY:  The patient is married.  She works as an Designer, multimedia.  She is independent of activities of daily living. There is no history of alcohol, tobacco, or drug abuse.  ADVANCE DIRECTIVES:  Full code.  REVIEW OF SYSTEMS:  All systems reviewed and apart from history of presenting illness are negative.  She has some controlled skin rashes from the  psoriasis on her left knee, lower back.  PHYSICAL EXAMINATION:  GENERAL:  Mr. Doyne Keel is a moderately built and obese female patient who is in no obvious distress. VITAL SIGNS:  Temperature is 98.3 degrees Fahrenheit, pulse 70 per minute regular, respiration 18 per minute, blood pressure 134/84 mmHg, and saturating at 97% on room air. HEENT:  Head nontraumatic, normocephalic.  Pupils equally reacting to light and accommodation and oral mucosa is moist. NECK:  Supple.  No JVD or carotid bruit. LYMPHATICS:  No lymphadenopathy. RESPIRATORY:  Clear.  No increased work of breathing. CARDIOVASCULAR:  First and second heart sounds heard.  Regular rate and rhythm.  No murmurs, rubs,  gallops, or clicks. ABDOMEN:  Obese, nontender, soft.  No organomegaly or masses appreciated.  Bowel sounds normally heard. CENTRAL NERVOUS SYSTEM:  The patient is awake, alert, oriented x3 with no focal neurological deficits. EXTREMITIES:  With 1+ pitting edema, bilateral legs.  No cyanosis, clubbing.  Peripheral pulses are symmetrically felt and power is grade 5/5. SKIN:  The patient has some controlled 1 or 2 spots of psoriasis underneath the left knee and in the mid lower back. MUSCULOSKELETAL:  Negative.  LABORATORY DATA:  Cardiac enzymes x2 show mild elevation in the CK to 213 and CK-MB of 4.7.  Troponins x2 are negative.  Comprehensive metabolic panel significant for glucose 138 and ALT of 38.  CBC within normal limits.  Chest x-ray shows no acute cardiopulmonary disease. Heart size is in the upper limits of normal.  EKG shows normal sinus rhythm at 85 beats per minute with normal axis.  There are no acute ischemic changes.  ASSESSMENT AND PLAN: 1. Chest pain, which is suspicious for angina in this pleasant young     patient with multiple risk factors for coronary artery disease.  We     will rule out coronary acute coronary syndrome.  Admit the patient     to telemetry.  Cycle cardiac enzymes and given her history of     sedentary lifestyle and recent travel, we will also check D-dimer     to rule out venous thromboembolism.  Continue with nitroglycerin     paste.  [facility] has been consulted and they request to     keep her n.p.o. in case they have to do either a stress test or     coronary angiogram. 2. Hypertension.  We will continue her home medications once she is     able to take p.o. 3. Morbid obesity. 4. Gastroesophageal reflux disease, IV PPI. 5. Full code status.  Time taken in coordinating this history and physical note was 1 hour.     Marcellus Scott, MD     AH/MEDQ  D:  03/12/2011  T:  03/12/2011  Job:  [id number]  cc:   Neena Rhymes,  M.D.  Electronically Signed by Marcellus Scott MD on 03/22/2011 01:32:49 AM

## 2011-03-30 NOTE — Discharge Summary (Signed)
NAMEMarland Kitchen  MAAME, DACK NO.:  [id number]  MEDICAL RECORD NO.:  [id number]  LOCATION:  2010                         FACILITY:  [facility]  PHYSICIAN:  Marcellus Scott, MD     DATE OF BIRTH:  19-Feb-1956  DATE OF ADMISSION:  03/12/2011 DATE OF DISCHARGE:  03/12/2011                        DISCHARGE SUMMARY - REFERRING   PRIMARY CARE PHYSICIAN:  Virginia Rhymes, MD with [facility] in [place].  NEUROSURGEON:  Hewitt Shorts, MD  DISCHARGE DIAGNOSES: 1. Chest pain.  Ruled out for acute coronary syndrome.  For outpatient     stress test. 2. Hypertension. 3. Morbid obesity. 4. Gastroesophageal reflux disease. 5. Low back pain. 6. History of psoriasis.  DISCHARGE MEDICATIONS: 1. Enteric-coated aspirin 325 mg p.o. daily. 2. Claritin 10 mg p.o. q.p.m. 3. Clobetasol ointment 0.05% one application topically daily. 4. Finacea 15% gel one application topically daily. 5. Flonase 1 spray nasally daily. 6. Hydrocortisone 0.2% one application topically daily. 7. Lotensin 10 mg p.o. daily. 8. Multivitamins 1 tablet p.o. q.p.m. 9. Naprosyn 500 mg p.o. b.i.d. 10.Vectical 3 mcg ointment one application topically daily. 11.Zantac 150 mg p.o. at bedtime.  IMAGING: 1. CT angiogram of the chest with contrast, impression,     a.     Suboptimal contrast bolus, significantly limiting      sensitivity for segmental and subsegmental pulmonary emboli.  No      definite filling defects are present to suggest pulmonary emboli.     b.     Diffuse fatty infiltration of the liver.     c.     Ill-defined ground-glass attenuation within the medial      aspect of the right lower lobe.  This may represent focal      pneumonia.  This is not typical appearance of atelectasis.      Neoplasm is considered less likely. 2. Chest x-ray on March 12, 2011, impression, no acute cardiopulmonary     disease.  Heart size upper limits of normal.  PERTINENT LABS:  Cardiac  panel cycled and not indicative of acute coronary syndrome.  D-dimer is 0.95.  Comprehensive metabolic panel only significant for glucose 138 and ALT of 38.  CBC within normal limits.  CONSULTATIONS:  Cardiology, Verne Carrow, MD.  DIET:  Heart-healthy diet.  ACTIVITIES:  Ad lib.  COMPLAINTS:  No further chest pain or dyspnea.  PHYSICAL EXAMINATION:  GENERAL:  The patient is in no distress. VITAL SIGNS:  Temperature 97.6 degrees Fahrenheit, pulse 67 per minute, respirations 16 per minute, blood pressure 129/83 mmHg, oxygen saturation of 99% on room air. RESPIRATORY:  Clear. CARDIOVASCULAR:  First and second heart sounds heard regular. ABDOMEN:  Soft and nondistended.  Bowel sounds present. CENTRAL NERVOUS SYSTEM:  The patient is awake, alert, oriented x3 with no focal neurological deficits. EXTREMITIES:  5/5 power.  HOSPITAL COURSE:  Virginia Matthews is a pleasant 55 year old female patient with history of hypertension, gastroesophageal reflux disease, obesity who woke up at approximately midnight last night with difficulty breathing, chest discomfort with radiation to the neck as well as the left arm.  She presented to the emergency department in [facility]. The chest discomfort lasted approximately 2 hours and then resolved with sublingual nitroglycerin.  She was subsequently transferred to [facility] for further evaluation.  She was admitted to telemetry, which did not show any arrhythmia alarms.  Her cardiac enzymes were cycled and negative.  Given her risk factors including strong family history of coronary artery disease, morbid obesity, and the chest pain which was suspicious for angina, Cardiology consultation was requested. Dr. Clifton James kindly provided a consultation and indicated that the chest pain was atypical and recommended that if the third set of cardiac enzymes were negative and D-dimer was negative, then the patient could be discharged home  and his office will arrange for outpatient stress test and followup.  The cardiac enzymes were negative.  EKG did not show any acute changes.  However, the patient had a positive D-dimer.  She had recently traveled to [place] by car at the end of April and has bilateral leg swellings, possibly from edema.  CT angiogram of the chest was done.  It was a suboptimal study, but large pulmonary embolism was ruled out.  Her bilateral lower extremity venous Doppler were performed, which was negative.  DISPOSITION:  The patient is discharged home in stable condition.  FOLLOWUP RECOMMENDATIONS: 1. With the [facility] for a stress test, which has been     arranged by the cardiologist for tomorrow. 2. Outpatient followup with Dr. Verne Carrow.  The     cardiologist office will call for an appointment. 3. Outpatient followup with Dr. Neena Matthews.  The patient is to     call for an appointment.  Time taken in coordinating this discharge is 30 minutes.     Marcellus Scott, MD     AH/MEDQ  D:  03/12/2011  T:  03/12/2011  Job:  [id number]  cc:   Verne Carrow, MD Virginia Matthews, M.D.  Electronically Signed by Marcellus Scott MD on 03/30/2011 11:18:37 PM

## 2011-04-16 ENCOUNTER — Other Ambulatory Visit: Payer: Self-pay | Admitting: Family Medicine

## 2011-04-16 DIAGNOSIS — Z1231 Encounter for screening mammogram for malignant neoplasm of breast: Secondary | ICD-10-CM

## 2011-04-24 ENCOUNTER — Ambulatory Visit (HOSPITAL_COMMUNITY): Payer: 59 | Attending: Family Medicine

## 2011-06-09 DIAGNOSIS — N809 Endometriosis, unspecified: Secondary | ICD-10-CM | POA: Insufficient documentation

## 2011-06-09 DIAGNOSIS — J302 Other seasonal allergic rhinitis: Secondary | ICD-10-CM | POA: Insufficient documentation

## 2011-06-09 DIAGNOSIS — E282 Polycystic ovarian syndrome: Secondary | ICD-10-CM | POA: Insufficient documentation

## 2011-06-09 DIAGNOSIS — I10 Essential (primary) hypertension: Secondary | ICD-10-CM | POA: Insufficient documentation

## 2011-06-09 DIAGNOSIS — E669 Obesity, unspecified: Secondary | ICD-10-CM | POA: Insufficient documentation

## 2011-06-09 DIAGNOSIS — M5126 Other intervertebral disc displacement, lumbar region: Secondary | ICD-10-CM | POA: Insufficient documentation

## 2011-06-17 ENCOUNTER — Encounter: Payer: Self-pay | Admitting: Internal Medicine

## 2011-06-17 ENCOUNTER — Ambulatory Visit (INDEPENDENT_AMBULATORY_CARE_PROVIDER_SITE_OTHER): Payer: 59 | Admitting: Internal Medicine

## 2011-06-17 DIAGNOSIS — L03119 Cellulitis of unspecified part of limb: Secondary | ICD-10-CM

## 2011-06-17 DIAGNOSIS — L0390 Cellulitis, unspecified: Secondary | ICD-10-CM

## 2011-06-17 DIAGNOSIS — B349 Viral infection, unspecified: Secondary | ICD-10-CM

## 2011-06-17 DIAGNOSIS — B9789 Other viral agents as the cause of diseases classified elsewhere: Secondary | ICD-10-CM

## 2011-06-17 DIAGNOSIS — L02419 Cutaneous abscess of limb, unspecified: Secondary | ICD-10-CM

## 2011-06-17 MED ORDER — DOXYCYCLINE HYCLATE 100 MG PO TABS: 100.0000 mg | ORAL_TABLET | Freq: Two times a day (BID) | ORAL | Status: DC

## 2011-06-17 NOTE — Patient Instructions (Signed)
  Call if no better in few days, call if redness develops  Cellulitis Cellulitis is an infection of the skin and the tissue beneath it. The area is typically red and tender. It is caused by germs (bacteria) (usually staph or strep) that enter the body through cuts or sores. Cellulitis most commonly occurs in the arms or lower legs.  HOME CARE INSTRUCTIONS  If you are given a prescription for medications which kill germs (antibiotics), take as directed until finished.   If the infection is on the arm or leg, keep the limb elevated as able.   Use a warm cloth several times per day to relieve pain and encourage healing.   See your caregiver for recheck if problems arise.   Only take over-the-counter or prescription medicines for pain, discomfort, or fever as directed by your caregiver.  SEEK MEDICAL CARE IF:  An oral temperature above 99  develops, not controlled by medication.   The area of redness (inflammation) is spreading, there are red streaks coming from the infected site, or if a part of the infection begins to turn dark in color.   The joint or bone underneath the infected skin becomes painful after the skin has healed.   The infection returns in the same or another area after it seems to have gone away.   A boil or bump swells up. This may be an abscess.   New, unexplained problems such as pain or fever develop.  SEEK IMMEDIATE MEDICAL CARE IF:  You or your child feels drowsy or lethargic.   There is vomiting, diarrhea, or lasting discomfort or feeling ill (malaise) with muscle aches and pains.  MAKE SURE YOU:   Understand these instructions.   Will watch your condition.   Will get help right away if you are not doing well or get worse.  Document Released: 06/24/2005 Document Re-Released: 07/12/2009 [facility] Patient Information 2011 [place], [place].

## 2011-06-17 NOTE — Progress Notes (Signed)
  Subjective:    Patient ID: Virginia Matthews, female    DOB: Jun 09, 1956, 55 y.o.   MRN: [id number]  HPI She came back from [place] 4 days ago, immediately after she arrived she developed fever as high as 102, some nausea and vomiting. She felt achy. Overall symptoms are better. 2 days ago, developed a tender and red spot at the left calf. In the last 48 hours the area is not getting worse.  Past Medical History  Diagnosis Date  . Ruptured lumbar disc     L5/S1  . GERD (gastroesophageal reflux disease)   . Seasonal allergies   . PCO (polycystic ovaries)   . Hypertension   . Obesity   . Endometriosis    Past Surgical History  Procedure Date  . Laparoscopic endometriosis fulguration   . Mole removal     on hip  . Pelvic laparoscopy     DIAG LAP    Review of Systems Denies any sore throat, diarrhea or headache. Does not remember any specific insect or spider bites but she was in the woods hiking while in [place]    Objective:   Physical Exam  Constitutional: She appears well-developed. No distress.  HENT:  Head: Normocephalic and atraumatic.  Mouth/Throat: No oropharyngeal exudate.  Cardiovascular: Normal rate, regular rhythm and normal heart sounds.   No murmur heard. Pulmonary/Chest: Effort normal and breath sounds normal. No respiratory distress. She has no wheezes. She has no rales.  Musculoskeletal:       calves symmetric (R calf  1/2 cm larger?) and nontender (except in the area of cellulitis)  Skin: She is not diaphoretic.             Assessment & Plan:  Cellulitis: Redness at the  left calf likely cellulitis, I doubt is related with the apparent virus she had. Will treat in the standard fashion with doxycycline, leg elevation, will call if no better. See instructions  Viral syndrome? See history of present illness, apparently she developed a viral syndrome that is now getting better.  Recent airplane trip  Calves not tender, essentially symmetric,.  Patient will call if anything changes

## 2011-06-22 ENCOUNTER — Encounter: Payer: 59 | Admitting: Obstetrics and Gynecology

## 2011-07-14 ENCOUNTER — Ambulatory Visit
Admission: RE | Admit: 2011-07-14 | Discharge: 2011-07-14 | Disposition: A | Discharge status: Home / Self Care | Payer: 59 | Source: Ambulatory Visit | Attending: Neurosurgery | Admitting: Neurosurgery

## 2011-07-14 ENCOUNTER — Other Ambulatory Visit: Payer: Self-pay | Admitting: Neurosurgery

## 2011-07-14 DIAGNOSIS — R52 Pain, unspecified: Secondary | ICD-10-CM

## 2011-07-15 ENCOUNTER — Other Ambulatory Visit (HOSPITAL_COMMUNITY): Payer: 59

## 2011-07-15 ENCOUNTER — Encounter (HOSPITAL_COMMUNITY)
Admission: RE | Admit: 2011-07-15 | Discharge: 2011-07-15 | Disposition: A | Discharge status: Home / Self Care | Payer: 59 | Source: Ambulatory Visit | Attending: Neurosurgery | Admitting: Neurosurgery

## 2011-07-15 LAB — CBC
HCT: 40.2 % (ref 36.0–46.0)
Hemoglobin: 13.3 g/dL (ref 12.0–15.0)
MCH: 30.3 pg (ref 26.0–34.0)
MCHC: 33.1 g/dL (ref 30.0–36.0)
MCV: 91.6 fL (ref 78.0–100.0)
Platelets: 269 10*3/uL (ref 150–400)
RBC: 4.39 MIL/uL (ref 3.87–5.11)
RDW: 13.4 % (ref 11.5–15.5)
WBC: 9.6 10*3/uL (ref 4.0–10.5)

## 2011-07-15 LAB — BASIC METABOLIC PANEL
BUN: 18 mg/dL (ref 6–23)
CO2: 29 mEq/L (ref 19–32)
Calcium: 10.1 mg/dL (ref 8.4–10.5)
Chloride: 104 mEq/L (ref 96–112)
Creatinine, Ser: 0.75 mg/dL (ref 0.50–1.10)
GFR calc Af Amer: >90 mL/min (ref >90)
GFR calc non Af Amer: >90 mL/min (ref >90)
Glucose, Bld: 136 mg/dL — ABNORMAL HIGH (ref 70–99)
Potassium: 4.5 mEq/L (ref 3.5–5.1)
Sodium: 141 mEq/L (ref 135–145)

## 2011-07-15 LAB — SURGICAL PCR SCREEN
MRSA, PCR: NEGATIVE
Staphylococcus aureus: POSITIVE — AB

## 2011-07-16 ENCOUNTER — Ambulatory Visit (HOSPITAL_COMMUNITY)
Admission: RE | Admit: 2011-07-16 | Discharge: 2011-07-17 | Disposition: A | Discharge status: Home / Self Care | Payer: 59 | Source: Ambulatory Visit | Attending: Neurosurgery | Admitting: Neurosurgery

## 2011-07-16 ENCOUNTER — Ambulatory Visit (HOSPITAL_COMMUNITY): Payer: 59

## 2011-07-16 DIAGNOSIS — I10 Essential (primary) hypertension: Secondary | ICD-10-CM | POA: Insufficient documentation

## 2011-07-16 DIAGNOSIS — Z01812 Encounter for preprocedural laboratory examination: Secondary | ICD-10-CM | POA: Insufficient documentation

## 2011-07-16 DIAGNOSIS — M47817 Spondylosis without myelopathy or radiculopathy, lumbosacral region: Secondary | ICD-10-CM | POA: Insufficient documentation

## 2011-07-16 DIAGNOSIS — M216X9 Other acquired deformities of unspecified foot: Secondary | ICD-10-CM | POA: Insufficient documentation

## 2011-07-16 DIAGNOSIS — M5126 Other intervertebral disc displacement, lumbar region: Secondary | ICD-10-CM | POA: Insufficient documentation

## 2011-07-16 DIAGNOSIS — M5137 Other intervertebral disc degeneration, lumbosacral region: Secondary | ICD-10-CM | POA: Insufficient documentation

## 2011-07-16 DIAGNOSIS — M51379 Other intervertebral disc degeneration, lumbosacral region without mention of lumbar back pain or lower extremity pain: Secondary | ICD-10-CM | POA: Insufficient documentation

## 2011-07-16 DIAGNOSIS — Z01810 Encounter for preprocedural cardiovascular examination: Secondary | ICD-10-CM | POA: Insufficient documentation

## 2011-07-16 HISTORY — PX: MICRODISCECTOMY LUMBAR: SUR864

## 2011-07-17 NOTE — Op Note (Signed)
Virginia Matthews, Virginia Matthews       ACCOUNT NO.:  [id number]  MEDICAL RECORD NO.:  [id number]  LOCATION:  3523                         FACILITY:  [facility]  PHYSICIAN:  Hewitt Shorts, M.D.DATE OF BIRTH:  [birth year]  DATE OF PROCEDURE:  07/16/2011 DATE OF DISCHARGE:                              OPERATIVE REPORT   PREOPERATIVE DIAGNOSES: 1. Right L4-5 lumbar disk herniation. 2. Lumbar spondylosis. 3. Lumbar degenerative disk disease. 4. Lumbar radiculopathy with foot drop.  POSTOPERATIVE DIAGNOSES: 1. Right L4-5 lumbar disk herniation. 2. Right L4-5 synovial cyst. 3. Lumbar spondylosis. 4. Lumbar degenerative disease. 5. Lumbar radiculopathy with foot drop.  PROCEDURES: 1. Right L4-5 lumbar laminotomy. 2. Resection of synovial cyst. 3. Microdiskectomy with microdissection, microsurgical technique and     the operating microscope.  SURGEON:  Hewitt Shorts, MD.  ASSISTANT:  Stefani Dama, MD.  ANESTHESIA:  General endotracheal.  INDICATION:  The patient is a 55 year old woman who initially presented with right lumbar radicular pain syndrome.  MRI scan this past spring showed a right L4-5 lumbar disk herniation.  It was managed nonsurgically.  However, 2 weeks ago, she developed a right foot drop with pronounced weakness of the right dorsiflexors and extensor pollicis longus.  She is noticing foot slapping while walking a tendency to stumble.  MRI was repeated and the patient brought to surgery for decompression. Intraoperatively, we found that the right L5 nerve was compressed both dorsally and ventrally by combination of synovial cyst dorsally and disk herniation ventrally.  Both the synovial cyst and disk herniation removed to decompress the thecal sac and nerve root.  PROCEDURE IN DETAIL:  The patient was brought to the operating room and placed on general endotracheal anesthesia.  The patient was turned to prone position.  Lumbar region was prepped with  Betadine soap and solution, draped in a sterile fashion.  The midline incision was infiltrated with local anesthetic with epinephrine.  An x-ray was taken and the L4-5 level identified.  The midline incision was made over the L4-5 level and carried down through the subcutaneous tissue.  Bipolar electrocautery was used to maintain hemostasis.  Dissection was carried down to the lumbar fascia, which was incised on the right side of the midline and the paraspinal muscles of the spinous process and lamina in a subperiosteal fashion.  Another x-ray was taken and the L4-5 interlaminar space was identified.  Self-retaining retractors were placed.  The operating microscope was draped and brought into the field to provide additional magnification, illumination, and visualization. The remainder of the decompression was performed using microdissection and microsurgical technique.  Laminotomy was performed using the high- speed drill and Kerrison punches.  We carefully removed the ligamentum flavum.  We exposed the thecal sac and the exiting right L5 nerve root. As we are doing the laminotomy and removal of ligamentum flavum, we encountered protrusion of thickened synovium, dorsal lateral from the thecal sac and nerve root compressing the thecal sac and nerve root. This was carefully removed.  We then were able to retract the thecal sac and nerve root medially and exposed the disk herniation.  The overlying ligament tissues were incised and the fragment removed in a piecemeal fashion with good decompression of the thecal sac  and nerve root ventrally.  We did not see directly entering into the disk space, and therefore felt that there was no advantages to opening the anulus and entering the disk space.  We felt that having removed the hernia defect, we achieved good ventral decompression and by having removed the synovial cyst, we achieved with dorsal decompression.  We therefore established  hemostasis with the use of bipolar electrocautery as well as Gelfoam soaked in thrombin.  Once hemostasis was established, we infiltrated 2 mL of fentanyl and 80 mg of Depo-Medrol into the epidural space and proceeded with closure.  Deep fascia was closed with interrupted undyed 1 Vicryl sutures.  Scarpa fascia closed with undyed 1- 0 Vicryl sutures.  Subcutaneous and subcuticular closed with inverted 2- 0 undyed Vicryl sutures.  Skin was approximated with surgical staples. Wound was dressed with Adaptic and sterile gauze and 4-inch Hypafix. The procedure was tolerated well.  The estimated blood loss was 50 mL. Sponge count correct.  Following surgery, the patient was turned back to supine position to be reversed from the anesthetic, extubated, and transferred to the recovery room for further care.     Hewitt Shorts, M.D.     RWN/MEDQ  D:  07/16/2011  T:  07/17/2011  Job:  [id number]  Electronically Signed by Shirlean Kelly M.D. on 07/17/2011 12:46:22 PM

## 2011-08-10 ENCOUNTER — Other Ambulatory Visit: Payer: Self-pay | Admitting: Family Medicine

## 2011-08-10 MED ORDER — LISINOPRIL 10 MG PO TABS: 10.0000 mg | ORAL_TABLET | Freq: Every day | ORAL | Status: DC

## 2011-08-10 NOTE — Telephone Encounter (Signed)
rx sent to pharmacy by e-script For zestril

## 2011-09-30 ENCOUNTER — Ambulatory Visit (HOSPITAL_COMMUNITY)
Admission: RE | Admit: 2011-09-30 | Discharge: 2011-09-30 | Disposition: A | Discharge status: Home / Self Care | Payer: 59 | Source: Ambulatory Visit | Attending: Family Medicine | Admitting: Family Medicine

## 2011-09-30 DIAGNOSIS — Z1231 Encounter for screening mammogram for malignant neoplasm of breast: Secondary | ICD-10-CM | POA: Insufficient documentation

## 2012-02-17 ENCOUNTER — Other Ambulatory Visit: Payer: Self-pay | Admitting: Dermatology

## 2012-03-14 ENCOUNTER — Other Ambulatory Visit: Payer: Self-pay | Admitting: Family Medicine

## 2012-03-14 NOTE — Telephone Encounter (Signed)
refill lisinopril 10mg  tablet #30, take 1-tablet by mouth daily, last fill 5.13.13, last of 4.23.12

## 2012-03-15 MED ORDER — LISINOPRIL 10 MG PO TABS: 10.0000 mg | ORAL_TABLET | Freq: Every day | ORAL | Status: DC

## 2012-03-15 NOTE — Telephone Encounter (Signed)
rx sent to pharmacy by e-script for #30 with no refills Letter has been mailed to pt address noted in the chart to advise they are overdue for cpe/ov/labs and the pt needs to contact office to set up appt   

## 2012-03-16 NOTE — Telephone Encounter (Signed)
Noted MD Tabori  aware and will send any medications needed til her CPE

## 2012-03-16 NOTE — Telephone Encounter (Signed)
Patient called this morning and scheduled for 8.16.13 cpe/wt-labs

## 2012-04-13 ENCOUNTER — Telehealth: Payer: Self-pay | Admitting: Family Medicine

## 2012-04-13 MED ORDER — LISINOPRIL 10 MG PO TABS: 10.0000 mg | ORAL_TABLET | Freq: Every day | ORAL | Status: DC

## 2012-04-13 NOTE — Telephone Encounter (Signed)
Refill: Lisinopril 10mg  tablet. Take 1 tablet by mouth daily. Qty 30. Last fill 03-15-12

## 2012-04-13 NOTE — Telephone Encounter (Signed)
Refill done. Pt has a cpe scheduled for 8.16.13.

## 2012-04-14 ENCOUNTER — Telehealth: Payer: Self-pay | Admitting: *Deleted

## 2012-04-14 MED ORDER — LISINOPRIL 10 MG PO TABS: 10.0000 mg | ORAL_TABLET | Freq: Every day | ORAL | Status: DC

## 2012-04-14 NOTE — Telephone Encounter (Signed)
Received request for Lisinopril 10mg  from [facility] outpatient pharmacy, MD Beverely Low advised to send #30 no refills per pt needs appt, currently note pt has recently called in to set up apt with MD Beverely Low 05-13-12, sent via escribe for #30 no refills

## 2012-05-11 ENCOUNTER — Telehealth: Payer: Self-pay | Admitting: Family Medicine

## 2012-05-11 NOTE — Telephone Encounter (Signed)
Refill: Lisinopril 10mg  tablet. Take 1 tablet by mouth daily. Qty 30. Last fill 04-13-12

## 2012-05-13 ENCOUNTER — Encounter: Payer: Self-pay | Admitting: Family Medicine

## 2012-05-13 ENCOUNTER — Ambulatory Visit (INDEPENDENT_AMBULATORY_CARE_PROVIDER_SITE_OTHER): Payer: 59 | Admitting: Family Medicine

## 2012-05-13 VITALS — BP 116/82 | HR 69 | Temp 97.8°F | Ht 66.0 in | Wt 329.2 lb

## 2012-05-13 DIAGNOSIS — F419 Anxiety disorder, unspecified: Secondary | ICD-10-CM | POA: Insufficient documentation

## 2012-05-13 DIAGNOSIS — K219 Gastro-esophageal reflux disease without esophagitis: Secondary | ICD-10-CM | POA: Insufficient documentation

## 2012-05-13 DIAGNOSIS — L0291 Cutaneous abscess, unspecified: Secondary | ICD-10-CM

## 2012-05-13 DIAGNOSIS — Z0000 Encounter for general adult medical examination without abnormal findings: Secondary | ICD-10-CM

## 2012-05-13 DIAGNOSIS — F329 Major depressive disorder, single episode, unspecified: Secondary | ICD-10-CM | POA: Insufficient documentation

## 2012-05-13 DIAGNOSIS — L0390 Cellulitis, unspecified: Secondary | ICD-10-CM | POA: Insufficient documentation

## 2012-05-13 DIAGNOSIS — F341 Dysthymic disorder: Secondary | ICD-10-CM

## 2012-05-13 DIAGNOSIS — F32A Depression, unspecified: Secondary | ICD-10-CM | POA: Insufficient documentation

## 2012-05-13 LAB — CBC WITH DIFFERENTIAL/PLATELET
Basophils Absolute: 0.1 10*3/uL (ref 0.0–0.1)
Basophils Relative: 0.6 % (ref 0.0–3.0)
Eosinophils Absolute: 0.3 10*3/uL (ref 0.0–0.7)
Eosinophils Relative: 3.4 % (ref 0.0–5.0)
HCT: 39.7 % (ref 36.0–46.0)
Hemoglobin: 13.1 g/dL (ref 12.0–15.0)
Lymphocytes Relative: 22.3 % (ref 12.0–46.0)
Lymphs Abs: 1.9 10*3/uL (ref 0.7–4.0)
MCHC: 33.1 g/dL (ref 30.0–36.0)
MCV: 91.8 fl (ref 78.0–100.0)
Monocytes Absolute: 0.5 10*3/uL (ref 0.1–1.0)
Monocytes Relative: 5.8 % (ref 3.0–12.0)
Neutro Abs: 5.9 10*3/uL (ref 1.4–7.7)
Neutrophils Relative %: 67.9 % (ref 43.0–77.0)
Platelets: 247 10*3/uL (ref 150.0–400.0)
RBC: 4.32 Mil/uL (ref 3.87–5.11)
RDW: 14.1 % (ref 11.5–14.6)
WBC: 8.6 10*3/uL (ref 4.5–10.5)

## 2012-05-13 LAB — HEPATIC FUNCTION PANEL
ALT: 41 U/L — ABNORMAL HIGH (ref 0–35)
AST: 35 U/L (ref 0–37)
Albumin: 4.1 g/dL (ref 3.5–5.2)
Alkaline Phosphatase: 77 U/L (ref 39–117)
Bilirubin, Direct: 0 mg/dL (ref 0.0–0.3)
Total Bilirubin: 0.3 mg/dL (ref 0.3–1.2)
Total Protein: 7.6 g/dL (ref 6.0–8.3)

## 2012-05-13 LAB — BASIC METABOLIC PANEL
BUN: 18 mg/dL (ref 6–23)
CO2: 27 mEq/L (ref 19–32)
Calcium: 9.6 mg/dL (ref 8.4–10.5)
Chloride: 103 mEq/L (ref 96–112)
Creatinine, Ser: 0.6 mg/dL (ref 0.4–1.2)
GFR: 109.88 mL/min (ref >60.00)
Glucose, Bld: 89 mg/dL (ref 70–99)
Potassium: 4.2 mEq/L (ref 3.5–5.1)
Sodium: 138 mEq/L (ref 135–145)

## 2012-05-13 LAB — LIPID PANEL
Cholesterol: 161 mg/dL (ref 0–200)
HDL: 55.3 mg/dL (ref >39.00)
LDL Cholesterol: 91 mg/dL (ref 0–99)
Total CHOL/HDL Ratio: 3
Triglycerides: 75 mg/dL (ref 0.0–149.0)
VLDL: 15 mg/dL (ref 0.0–40.0)

## 2012-05-13 LAB — TSH: TSH: 2.28 u[IU]/mL (ref 0.35–5.50)

## 2012-05-13 MED ORDER — DOXYCYCLINE HYCLATE 100 MG PO TABS: 100.0000 mg | ORAL_TABLET | Freq: Two times a day (BID) | ORAL | Status: AC

## 2012-05-13 MED ORDER — ESOMEPRAZOLE MAGNESIUM 40 MG PO CPDR: 40.0000 mg | DELAYED_RELEASE_CAPSULE | Freq: Every day | ORAL | Status: DC

## 2012-05-13 MED ORDER — LISINOPRIL 10 MG PO TABS: 10.0000 mg | ORAL_TABLET | Freq: Every day | ORAL | Status: DC

## 2012-05-13 MED ORDER — ESCITALOPRAM OXALATE 10 MG PO TABS: 10.0000 mg | ORAL_TABLET | Freq: Every day | ORAL | Status: DC

## 2012-05-13 NOTE — Progress Notes (Signed)
  Subjective:    Patient ID: Virginia Matthews, female    DOB: 1955/12/04, 56 y.o.   MRN: [id number]  HPI CPE- UTD on colonoscopy, mammo.    L leg redness- first noticed 2 weeks ago, posterior L lower leg.  No known injury or bug bite.  Area is not itchy or painful.  Has not grown in size.  Has recently been on Keflex for oral surgery.   Review of Systems Patient reports no vision/ hearing changes, adenopathy,fever, weight change,  persistant/recurrent hoarseness , swallowing issues, chest pain, palpitations, edema, persistant/recurrent cough, hemoptysis, gastrointestinal bleeding (melena, rectal bleeding), abdominal pain, bowel changes, GU symptoms (dysuria, hematuria, incontinence), Gyn symptoms (abnormal  bleeding, pain),  syncope, focal weakness, memory loss, numbness & tingling, skin/hair/nail changes, abnormal bruising or bleeding.   + GERD despite OTC pepcid, particularly at night + SOB w/ exertion + anxiety    Objective:   Physical Exam General Appearance:    Alert, cooperative, no distress, appears stated age, morbidly obese  Head:    Normocephalic, without obvious abnormality, atraumatic  Eyes:    PERRL, conjunctiva/corneas clear, EOM's intact, fundi    benign, both eyes  Ears:    Normal TM's and external ear canals, both ears  Nose:   Nares normal, septum midline, mucosa normal, no drainage    or sinus tenderness  Throat:   Lips, mucosa, and tongue normal; teeth and gums normal  Neck:   Supple, symmetrical, trachea midline, no adenopathy;    Thyroid: no enlargement/tenderness/nodules  Back:     Symmetric, no curvature, ROM normal, no CVA tenderness  Lungs:     Clear to auscultation bilaterally, respirations unlabored  Chest Wall:    No tenderness or deformity   Heart:    Regular rate and rhythm, S1 and S2 normal, no murmur, rub   or gallop  Breast Exam:    Deferred to GYN  Abdomen:     Soft, non-tender, bowel sounds active all four quadrants,    no masses, no  organomegaly  Genitalia:    Deferred to GYN  Rectal:    Extremities:   Extremities normal, atraumatic, no cyanosis or edema  Pulses:   2+ and symmetric all extremities  Skin:   + psoriasis, 5 cm circular area of erythema on posterior L lower leg, + warmth and mild induration  Lymph nodes:   Cervical, supraclavicular, and axillary nodes normal  Neurologic:   CNII-XII intact, normal strength, sensation and reflexes    throughout          Assessment & Plan:

## 2012-05-13 NOTE — Patient Instructions (Addendum)
Follow up in 4-6 weeks to recheck mood Start the Lexapro daily We'll notify you of your lab results and make any changes if needed Start the Doxy for the cellulitis Try and focus on healthy diet and regular exercise Call with any questions or concerns Hang in there!  (Happy [place] Birthday!)

## 2012-05-13 NOTE — Assessment & Plan Note (Signed)
New.  Pt very stressed about current work situation and husband's poor health.  Start SSRI.  Reviewed supportive care and red flags that should prompt return.  Pt expressed understanding and is in agreement w/ plan.

## 2012-05-13 NOTE — Assessment & Plan Note (Signed)
New.  Given that pt has recently been on Keflex, will start  Doxy for cellulitis on L lower leg.  Reviewed supportive care and red flags that should prompt return.  Pt expressed understanding and is in agreement w/ plan.

## 2012-05-13 NOTE — Telephone Encounter (Signed)
Rx sent      KP 

## 2012-05-13 NOTE — Assessment & Plan Note (Signed)
Pt's PE WNL w/ exception of morbid obesity and skin issues.  UTD on health maintenance.  Check labs.  Anticipatory guidance provided.

## 2012-05-13 NOTE — Assessment & Plan Note (Signed)
New.  Start PPI as pt has failed OTC H2 blocker.

## 2012-05-18 LAB — VITAMIN D 1,25 DIHYDROXY
Vitamin D 1, 25 (OH)2 Total: 38 pg/mL (ref 18–72)
Vitamin D2 1, 25 (OH)2: <8 pg/mL
Vitamin D3 1, 25 (OH)2: 38 pg/mL

## 2012-06-24 ENCOUNTER — Ambulatory Visit (INDEPENDENT_AMBULATORY_CARE_PROVIDER_SITE_OTHER): Payer: 59 | Admitting: Family Medicine

## 2012-06-24 ENCOUNTER — Encounter: Payer: Self-pay | Admitting: Family Medicine

## 2012-06-24 VITALS — BP 124/81 | HR 78 | Temp 98.3°F | Ht 66.25 in | Wt 332.4 lb

## 2012-06-24 DIAGNOSIS — F341 Dysthymic disorder: Secondary | ICD-10-CM

## 2012-06-24 DIAGNOSIS — L0390 Cellulitis, unspecified: Secondary | ICD-10-CM

## 2012-06-24 DIAGNOSIS — F32A Depression, unspecified: Secondary | ICD-10-CM

## 2012-06-24 DIAGNOSIS — F329 Major depressive disorder, single episode, unspecified: Secondary | ICD-10-CM

## 2012-06-24 DIAGNOSIS — F419 Anxiety disorder, unspecified: Secondary | ICD-10-CM

## 2012-06-24 DIAGNOSIS — L0291 Cutaneous abscess, unspecified: Secondary | ICD-10-CM

## 2012-06-24 NOTE — Progress Notes (Signed)
  Subjective:    Patient ID: Virginia Matthews, female    DOB: 17-Jun-1956, 56 y.o.   MRN: [id number]  HPI Depression/anxiety- started Lexapro at last visit.  'sleeping better', no longer having panic attacks, less 'self deprecating' thoughts.  Cellulitis- L lower leg.  Completed course of doxy, not painful.  Has not applied steroid cream.  Not itchy.  Improved since completing doxy but not resolved.   Review of Systems For ROS see HPI     Objective:   Physical Exam  Vitals reviewed. Constitutional: She appears well-developed and well-nourished. No distress.  Skin: Skin is warm and dry. There is erythema (3 cm circular area, w/out warmth, drainage or rash).  Psychiatric: She has a normal mood and affect. Her behavior is normal. Judgment and thought content normal.          Assessment & Plan:

## 2012-06-24 NOTE — Patient Instructions (Addendum)
Continue the Lexapro daily- i'm so glad it's working Apply the steroid cream to the red spot on the leg If no improvement after 2 weeks- call derm Call with any questions or concerns Happy Fall!!!

## 2012-06-24 NOTE — Assessment & Plan Note (Signed)
Improved.  No area of active infection but redness remains.  Start steroid cream topically BID.  If no improvement in 2 weeks, will call derm.  Pt expressed understanding and is in agreement w/ plan.

## 2012-06-24 NOTE — Assessment & Plan Note (Signed)
Improved since starting Lexapro.  Will continue meds and periodically re-evaluate

## 2012-11-12 ENCOUNTER — Other Ambulatory Visit: Payer: Self-pay

## 2012-11-25 ENCOUNTER — Telehealth: Payer: Self-pay | Admitting: Family Medicine

## 2012-11-25 NOTE — Telephone Encounter (Signed)
MEDICATION CHANGE requested by patient per faz from pharmacy from Nexium to pantoprazole

## 2012-11-25 NOTE — Telephone Encounter (Signed)
Please advise on med change request.//AB/CMA

## 2012-11-27 NOTE — Telephone Encounter (Signed)
Ok to switch to pantoprazole #30, 6 refills

## 2012-11-29 MED ORDER — PANTOPRAZOLE SODIUM 40 MG PO TBEC: 40.0000 mg | DELAYED_RELEASE_TABLET | Freq: Every day | ORAL | Status: DC

## 2012-11-29 NOTE — Telephone Encounter (Signed)
Med change and new rx sent to the pharmacy([facility]) by e-script.//AB/CMA

## 2012-12-09 ENCOUNTER — Other Ambulatory Visit: Payer: Self-pay | Admitting: Family Medicine

## 2012-12-12 ENCOUNTER — Other Ambulatory Visit: Payer: Self-pay | Admitting: Family Medicine

## 2012-12-22 ENCOUNTER — Encounter: Payer: Self-pay | Admitting: Family Medicine

## 2012-12-22 ENCOUNTER — Ambulatory Visit (INDEPENDENT_AMBULATORY_CARE_PROVIDER_SITE_OTHER): Payer: 59 | Admitting: Family Medicine

## 2012-12-22 VITALS — BP 110/80 | HR 70 | Temp 98.1°F | Ht 65.75 in | Wt 326.2 lb

## 2012-12-22 DIAGNOSIS — F329 Major depressive disorder, single episode, unspecified: Secondary | ICD-10-CM

## 2012-12-22 DIAGNOSIS — F419 Anxiety disorder, unspecified: Secondary | ICD-10-CM

## 2012-12-22 DIAGNOSIS — I10 Essential (primary) hypertension: Secondary | ICD-10-CM

## 2012-12-22 DIAGNOSIS — F341 Dysthymic disorder: Secondary | ICD-10-CM

## 2012-12-22 DIAGNOSIS — E669 Obesity, unspecified: Secondary | ICD-10-CM

## 2012-12-22 DIAGNOSIS — R7401 Elevation of levels of liver transaminase levels: Secondary | ICD-10-CM

## 2012-12-22 DIAGNOSIS — R7402 Elevation of levels of lactic acid dehydrogenase (LDH): Secondary | ICD-10-CM

## 2012-12-22 DIAGNOSIS — F32A Depression, unspecified: Secondary | ICD-10-CM

## 2012-12-22 LAB — BASIC METABOLIC PANEL
BUN: 12 mg/dL (ref 6–23)
CO2: 29 mEq/L (ref 19–32)
Calcium: 9.1 mg/dL (ref 8.4–10.5)
Chloride: 100 mEq/L (ref 96–112)
Creatinine, Ser: 0.8 mg/dL (ref 0.4–1.2)
GFR: 76.46 mL/min (ref >60.00)
Glucose, Bld: 103 mg/dL — ABNORMAL HIGH (ref 70–99)
Potassium: 4 mEq/L (ref 3.5–5.1)
Sodium: 136 mEq/L (ref 135–145)

## 2012-12-22 LAB — HEPATIC FUNCTION PANEL
ALT: 50 U/L — ABNORMAL HIGH (ref 0–35)
AST: 42 U/L — ABNORMAL HIGH (ref 0–37)
Albumin: 3.8 g/dL (ref 3.5–5.2)
Alkaline Phosphatase: 89 U/L (ref 39–117)
Bilirubin, Direct: 0.1 mg/dL (ref 0.0–0.3)
Total Bilirubin: 0.5 mg/dL (ref 0.3–1.2)
Total Protein: 7.4 g/dL (ref 6.0–8.3)

## 2012-12-22 NOTE — Assessment & Plan Note (Signed)
Repeat labs to trend.

## 2012-12-22 NOTE — Progress Notes (Signed)
  Subjective:    Patient ID: Virginia Matthews, female    DOB: 1956/02/06, 57 y.o.   MRN: [id number]  HPI HTN- chronic problem, on Lisinopril.  Excellent control.  No CP, SOB, HAs, visual changes, edema, N/V/D.  Depression- chronic problem, on Lexapro.  Reports sxs are well controlled.  Denies trouble sleeping.  Obesity- ongoing problem for pt.  Has applied to the [employer] exercise program via [employer].  Has paperwork for me to complete.   Review of Systems For ROS see HPI     Objective:   Physical Exam  Vitals reviewed. Constitutional: She is oriented to person, place, and time. She appears well-developed and well-nourished. No distress.  HENT:  Head: Normocephalic and atraumatic.  Eyes: Conjunctivae and EOM are normal. Pupils are equal, round, and reactive to light.  Neck: Normal range of motion. Neck supple. No thyromegaly present.  Cardiovascular: Normal rate, regular rhythm, normal heart sounds and intact distal pulses.   No murmur heard. Pulmonary/Chest: Effort normal and breath sounds normal. No respiratory distress.  Abdominal: Soft. She exhibits no distension. There is no tenderness.  Musculoskeletal: She exhibits no edema.  Lymphadenopathy:    She has no cervical adenopathy.  Neurological: She is alert and oriented to person, place, and time.  Skin: Skin is warm and dry.  Psychiatric: She has a normal mood and affect. Her behavior is normal.          Assessment & Plan:

## 2012-12-22 NOTE — Assessment & Plan Note (Signed)
Chronic problem.  Excellent control.  Asymptomatic.  No changes.  Check BMP.

## 2012-12-22 NOTE — Assessment & Plan Note (Signed)
Chronic problem.  Pt has signed up for new supervised exercise program.  Forms completed.  Applauded her efforts.  Will follow.

## 2012-12-22 NOTE — Patient Instructions (Addendum)
Schedule your complete physical in 6 months We'll notify you of your lab results I'm proud of the exercise!!  You can do it!! Call with any questions or concerns Happy Spring!

## 2012-12-22 NOTE — Assessment & Plan Note (Signed)
Unchanged.  Well controlled.  Continue current meds.

## 2012-12-30 ENCOUNTER — Telehealth: Payer: Self-pay | Admitting: *Deleted

## 2012-12-30 DIAGNOSIS — R7989 Other specified abnormal findings of blood chemistry: Secondary | ICD-10-CM

## 2012-12-30 NOTE — Telephone Encounter (Signed)
Message copied by Verdie Shire on Fri Dec 30, 2012  3:56 PM ------      Message from: Sheliah Hatch      Created: Fri Dec 23, 2012  8:46 AM       Pt aware of labs via MyChart.  Please enter RUQ [place] (dx elevated LFTs- on problem list) ------

## 2012-12-30 NOTE — Telephone Encounter (Signed)
Ordered RUQ [place] and sent.//AB/CMA

## 2013-01-02 NOTE — Addendum Note (Signed)
Addended by: Sheliah Hatch on: 01/02/2013 02:13 PM   Modules accepted: Orders

## 2013-01-06 ENCOUNTER — Other Ambulatory Visit: Payer: 59

## 2013-01-09 ENCOUNTER — Other Ambulatory Visit: Payer: Self-pay | Admitting: Family Medicine

## 2013-01-10 ENCOUNTER — Ambulatory Visit
Admission: RE | Admit: 2013-01-10 | Discharge: 2013-01-10 | Disposition: A | Discharge status: Home / Self Care | Payer: 59 | Source: Ambulatory Visit | Attending: Family Medicine | Admitting: Family Medicine

## 2013-01-10 DIAGNOSIS — R7989 Other specified abnormal findings of blood chemistry: Secondary | ICD-10-CM

## 2013-01-18 ENCOUNTER — Encounter: Payer: Self-pay | Admitting: General Practice

## 2013-03-01 ENCOUNTER — Encounter: Payer: Self-pay | Admitting: Family Medicine

## 2013-06-08 ENCOUNTER — Other Ambulatory Visit: Payer: Self-pay | Admitting: Family Medicine

## 2013-06-09 ENCOUNTER — Other Ambulatory Visit: Payer: Self-pay | Admitting: General Practice

## 2013-06-09 MED ORDER — LISINOPRIL 10 MG PO TABS: ORAL_TABLET | ORAL | Status: DC

## 2013-06-23 ENCOUNTER — Ambulatory Visit (INDEPENDENT_AMBULATORY_CARE_PROVIDER_SITE_OTHER): Payer: 59 | Admitting: Family Medicine

## 2013-06-23 ENCOUNTER — Encounter: Payer: Self-pay | Admitting: Family Medicine

## 2013-06-23 VITALS — BP 118/80 | HR 65 | Temp 98.5°F | Ht 65.75 in | Wt 305.2 lb

## 2013-06-23 DIAGNOSIS — M25571 Pain in right ankle and joints of right foot: Secondary | ICD-10-CM | POA: Insufficient documentation

## 2013-06-23 DIAGNOSIS — M25579 Pain in unspecified ankle and joints of unspecified foot: Secondary | ICD-10-CM

## 2013-06-23 DIAGNOSIS — E669 Obesity, unspecified: Secondary | ICD-10-CM

## 2013-06-23 DIAGNOSIS — I10 Essential (primary) hypertension: Secondary | ICD-10-CM

## 2013-06-23 LAB — BASIC METABOLIC PANEL
BUN: 14 mg/dL (ref 6–23)
CO2: 26 mEq/L (ref 19–32)
Calcium: 9.4 mg/dL (ref 8.4–10.5)
Chloride: 105 mEq/L (ref 96–112)
Creatinine, Ser: 0.8 mg/dL (ref 0.4–1.2)
GFR: 74.23 mL/min (ref >60.00)
Glucose, Bld: 105 mg/dL — ABNORMAL HIGH (ref 70–99)
Potassium: 4.3 mEq/L (ref 3.5–5.1)
Sodium: 138 mEq/L (ref 135–145)

## 2013-06-23 NOTE — Assessment & Plan Note (Signed)
Chronic problem.  Well controlled.  Asymptomatic.  Check labs- no anticipated med changes. 

## 2013-06-23 NOTE — Progress Notes (Signed)
  Subjective:    Patient ID: Virginia Matthews, female    DOB: 10/07/1955, 57 y.o.   MRN: [id number]  HPI HTN- chronic problem, on Lisinopril.  Denies CP, SOB, HAs, visual changes, edema.  Exercising 3x/week w/ trainer.  Has lost 30 lbs total, 21 since last visit.  Obesity- chronic problem, is working very hard on weight loss.  Has lost 30 lbs total w/ exercising 3x/week.  R ankle pain- sxs started 1 month ago.  Painful w/ certain movements, unable to do balance board at the gym.   Review of Systems For ROS see HPI     Objective:   Physical Exam  Vitals reviewed. Constitutional: She is oriented to person, place, and time. She appears well-developed and well-nourished. No distress.  HENT:  Head: Normocephalic and atraumatic.  Eyes: Conjunctivae and EOM are normal. Pupils are equal, round, and reactive to light.  Neck: Normal range of motion. Neck supple. No thyromegaly present.  Cardiovascular: Normal rate, regular rhythm, normal heart sounds and intact distal pulses.   No murmur heard. Pulmonary/Chest: Effort normal and breath sounds normal. No respiratory distress.  Abdominal: Soft. She exhibits no distension. There is no tenderness.  Musculoskeletal: Normal range of motion. She exhibits no edema and no tenderness (no TTP over R lateral malleolus, lateral ligaments).  Lymphadenopathy:    She has no cervical adenopathy.  Neurological: She is alert and oriented to person, place, and time.  Skin: Skin is warm and dry.  Psychiatric: She has a normal mood and affect. Her behavior is normal.          Assessment & Plan:

## 2013-06-23 NOTE — Assessment & Plan Note (Signed)
New.  Suspect some residual weakness from her prior footdrop w/ lateral instability.  Recommended ankle brace w/ exercise, reviewed PT exercises w/ pt.  Reviewed supportive care and red flags that should prompt return.  Pt expressed understanding and is in agreement w/ plan.

## 2013-06-23 NOTE — Patient Instructions (Addendum)
Schedule your complete physical in 6 months We'll notify you of your lab results Wear an ankle brace while exercising for support Ibuprofen as needed for pain Using a TheraBand, trace the alphabet w/ your foot/ankle Call if no improvement in the next few weeks and we'll send you to ortho Call with any questions or concerns CONGRATS on the weight loss!!

## 2013-06-23 NOTE — Assessment & Plan Note (Signed)
Chronic problem.  Has lost 30 lbs.  Applauded her efforts.  Will follow.

## 2013-07-11 ENCOUNTER — Other Ambulatory Visit: Payer: Self-pay | Admitting: Family Medicine

## 2013-07-11 NOTE — Telephone Encounter (Signed)
Med filled.  

## 2013-07-14 ENCOUNTER — Other Ambulatory Visit: Payer: Self-pay | Admitting: Family Medicine

## 2013-07-14 NOTE — Telephone Encounter (Signed)
Med filled.  

## 2013-08-03 ENCOUNTER — Other Ambulatory Visit: Payer: Self-pay

## 2013-08-04 ENCOUNTER — Encounter: Payer: Self-pay | Admitting: Family Medicine

## 2013-10-12 ENCOUNTER — Ambulatory Visit (INDEPENDENT_AMBULATORY_CARE_PROVIDER_SITE_OTHER): Payer: 59 | Admitting: Sports Medicine

## 2013-10-12 ENCOUNTER — Encounter: Payer: Self-pay | Admitting: Sports Medicine

## 2013-10-12 VITALS — BP 137/83 | Ht 65.5 in | Wt 305.0 lb

## 2013-10-12 DIAGNOSIS — M25561 Pain in right knee: Secondary | ICD-10-CM

## 2013-10-12 DIAGNOSIS — M25569 Pain in unspecified knee: Secondary | ICD-10-CM

## 2013-10-12 MED ORDER — DICLOFENAC SODIUM 75 MG PO TBEC: 75.0000 mg | DELAYED_RELEASE_TABLET | Freq: Two times a day (BID) | ORAL | Status: DC

## 2013-10-12 NOTE — Progress Notes (Signed)
   Subjective:    Patient ID: Virginia Matthews, female    DOB: 11-27-1955, 58 y.o.   MRN: [id number]  HPI chief complaint: Right knee pain  Very pleasant 58 year old female comes in today complaining of right knee pain. She does not recall a specific injury but she had an acute onset of pain on December 28. She had an exacerbation of this pain on New Year's Eve while walking up a hill. She describes a medial sided pain with some mild associated swelling that improved rather quickly over a couple of days while taking 500 mg of Aleve and utilizing both elevation and icing. In fact, today her knee feels pretty good. She is still taking 250 mg of Aleve daily. She has become active over the past several months and is here today mainly for advice regarding which exercises are safe for her to do for her knee. She denies any mechanical symptoms. No significant problems with his knee in the past. No prior knee surgery.  Past medical history is reviewed. History significant for psoriasis Medications are reviewed No known drug allergies    Review of Systems     Objective:   Physical Exam Obese, no acute distress. Awake alert and oriented x3. Vital signs are reviewed.  Right knee: Range of motion 0-120. No obvious effusion. Very slight tenderness to palpation along the medial joint line but a negative McMurray's and a negative Thessaly's. No tenderness to palpation along the lateral joint line. Knee is grossly stable to ligamentous exam. Trace patellofemoral crepitus. Neurovascularly intact distally.  Left knee: Full range of motion. No effusion. No joint line tenderness. Negative Thessaly's. Negative McMurray's. Stable grossly to ligamentous exam. Neurovascularly intact distally.  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 I do want to get some standing x-rays of her knee to evaluate degree of osteoarthritis present. She also has a history of psoriasis so psoriatic arthritis is in the differential. We spent quite a bit of time going over which exercises I want her to avoid. Specifically I want her to avoid any deep squatting or twisting upon the right knee. I've also given her a home exercise program consisting mainly of isometric quad strengthening exercises. I will call her after I review her x-rays. If her symptoms return and are not responsive to Aleve then I would consider an intra-articular cortisone injection. Followup formally when necessary.

## 2013-10-13 ENCOUNTER — Ambulatory Visit
Admission: RE | Admit: 2013-10-13 | Discharge: 2013-10-13 | Disposition: A | Discharge status: Home / Self Care | Payer: 59 | Source: Ambulatory Visit | Attending: Sports Medicine | Admitting: Sports Medicine

## 2013-10-13 DIAGNOSIS — M25561 Pain in right knee: Secondary | ICD-10-CM

## 2013-10-23 ENCOUNTER — Telehealth: Payer: Self-pay | Admitting: Sports Medicine

## 2013-10-23 NOTE — Telephone Encounter (Signed)
I spoke with the patient on the phone today after reviewing x-rays of her right knee. She has advanced medial compartmental DJD. Nothing acute. Symptoms are currently tolerable. If symptoms worsen I would start with a cortisone injection. She can start some half squats and half lunges but is instructed to avoid full squats and full lunges. Followup when necessary.

## 2013-12-11 ENCOUNTER — Other Ambulatory Visit: Payer: Self-pay | Admitting: Family Medicine

## 2013-12-11 NOTE — Telephone Encounter (Signed)
Med filled.  

## 2013-12-12 ENCOUNTER — Other Ambulatory Visit: Payer: Self-pay | Admitting: Family Medicine

## 2013-12-12 DIAGNOSIS — Z1231 Encounter for screening mammogram for malignant neoplasm of breast: Secondary | ICD-10-CM

## 2013-12-19 ENCOUNTER — Ambulatory Visit (HOSPITAL_COMMUNITY)
Admission: RE | Admit: 2013-12-19 | Discharge: 2013-12-19 | Disposition: A | Discharge status: Home / Self Care | Payer: 59 | Source: Ambulatory Visit | Attending: Family Medicine | Admitting: Family Medicine

## 2013-12-19 ENCOUNTER — Telehealth: Payer: Self-pay

## 2013-12-19 DIAGNOSIS — Z1231 Encounter for screening mammogram for malignant neoplasm of breast: Secondary | ICD-10-CM | POA: Insufficient documentation

## 2013-12-19 LAB — HM MAMMOGRAPHY: HM Mammogram: NORMAL

## 2013-12-19 NOTE — Telephone Encounter (Signed)
Medication and allergies:  Reviewed and updated  90 day supply/mail order: n/a Local pharmacy:  [facility], [place] - [address]   Immunizations due:  Td-declined at this time   A/P: No changes to personal, family history or past surgical hx PAP- last Pap smear 10/23/05; patient not interested in receiving a pap at this time.  CCS- 06/06/09-benign polyps, internal hemorrhoid and diverticulosis; repeat in 5 years.  Due: 05/2014 MMG- 09/30/11-negative; next MMG scheduled for this evening (12/19/13) Flu- 07/05/13 Tdap- "unsure"; declined    To Discuss with Provider: Nothing at this time.

## 2013-12-21 ENCOUNTER — Other Ambulatory Visit: Payer: Self-pay | Admitting: General Practice

## 2013-12-21 ENCOUNTER — Ambulatory Visit (HOSPITAL_COMMUNITY): Payer: 59

## 2013-12-21 ENCOUNTER — Ambulatory Visit (INDEPENDENT_AMBULATORY_CARE_PROVIDER_SITE_OTHER): Payer: 59 | Admitting: Family Medicine

## 2013-12-21 ENCOUNTER — Encounter: Payer: Self-pay | Admitting: Family Medicine

## 2013-12-21 VITALS — BP 124/74 | HR 72 | Temp 98.2°F | Resp 16 | Ht 66.0 in | Wt 313.0 lb

## 2013-12-21 DIAGNOSIS — Z0000 Encounter for general adult medical examination without abnormal findings: Secondary | ICD-10-CM

## 2013-12-21 DIAGNOSIS — R200 Anesthesia of skin: Secondary | ICD-10-CM

## 2013-12-21 DIAGNOSIS — R209 Unspecified disturbances of skin sensation: Secondary | ICD-10-CM

## 2013-12-21 LAB — HEPATIC FUNCTION PANEL
ALT: 30 U/L (ref 0–35)
AST: 30 U/L (ref 0–37)
Albumin: 4.1 g/dL (ref 3.5–5.2)
Alkaline Phosphatase: 77 U/L (ref 39–117)
Bilirubin, Direct: 0 mg/dL (ref 0.0–0.3)
Total Bilirubin: 0.5 mg/dL (ref 0.3–1.2)
Total Protein: 7.5 g/dL (ref 6.0–8.3)

## 2013-12-21 LAB — CBC WITH DIFFERENTIAL/PLATELET
Basophils Absolute: 0 10*3/uL (ref 0.0–0.1)
Basophils Relative: 0.6 % (ref 0.0–3.0)
Eosinophils Absolute: 0.3 10*3/uL (ref 0.0–0.7)
Eosinophils Relative: 4 % (ref 0.0–5.0)
HCT: 40.8 % (ref 36.0–46.0)
Hemoglobin: 13.5 g/dL (ref 12.0–15.0)
Lymphocytes Relative: 17.9 % (ref 12.0–46.0)
Lymphs Abs: 1.4 10*3/uL (ref 0.7–4.0)
MCHC: 33.1 g/dL (ref 30.0–36.0)
MCV: 91.3 fl (ref 78.0–100.0)
Monocytes Absolute: 0.4 10*3/uL (ref 0.1–1.0)
Monocytes Relative: 5.7 % (ref 3.0–12.0)
Neutro Abs: 5.6 10*3/uL (ref 1.4–7.7)
Neutrophils Relative %: 71.8 % (ref 43.0–77.0)
Platelets: 273 10*3/uL (ref 150.0–400.0)
RBC: 4.47 Mil/uL (ref 3.87–5.11)
RDW: 14.2 % (ref 11.5–14.6)
WBC: 7.8 10*3/uL (ref 4.5–10.5)

## 2013-12-21 LAB — TSH: TSH: 1.74 u[IU]/mL (ref 0.35–5.50)

## 2013-12-21 LAB — LIPID PANEL
Cholesterol: 168 mg/dL (ref 0–200)
HDL: 49.7 mg/dL (ref >39.00)
LDL Cholesterol: 101 mg/dL — ABNORMAL HIGH (ref 0–99)
Total CHOL/HDL Ratio: 3
Triglycerides: 85 mg/dL (ref 0.0–149.0)
VLDL: 17 mg/dL (ref 0.0–40.0)

## 2013-12-21 LAB — BASIC METABOLIC PANEL
BUN: 13 mg/dL (ref 6–23)
CO2: 27 mEq/L (ref 19–32)
Calcium: 9.5 mg/dL (ref 8.4–10.5)
Chloride: 104 mEq/L (ref 96–112)
Creatinine, Ser: 0.8 mg/dL (ref 0.4–1.2)
GFR: 77.27 mL/min (ref >60.00)
Glucose, Bld: 112 mg/dL — ABNORMAL HIGH (ref 70–99)
Potassium: 4.2 mEq/L (ref 3.5–5.1)
Sodium: 138 mEq/L (ref 135–145)

## 2013-12-21 LAB — HEMOGLOBIN A1C: Hgb A1c MFr Bld: 5.6 % (ref 4.6–6.5)

## 2013-12-21 MED ORDER — FLUTICASONE PROPIONATE 50 MCG/ACT NA SUSP: 2.0000 | Freq: Every day | NASAL | Status: DC

## 2013-12-21 MED ORDER — CYCLOBENZAPRINE HCL 10 MG PO TABS: 10.0000 mg | ORAL_TABLET | Freq: Every day | ORAL | Status: DC

## 2013-12-21 NOTE — Assessment & Plan Note (Signed)
Pt's PE WNL w/ exception of obesity.  Encouraged her to schedule pap.  UTD on mammo and colonoscopy.  Check labs.  Anticipatory guidance provided.

## 2013-12-21 NOTE — Patient Instructions (Signed)
Follow up in 6 months to recheck BP We'll notify you of your lab results and make any changes if needed Call and schedule PAP w/ GYN If you have a 2nd episode of foggy, dizzy, numbness anywhere- call me immediately so we can refer (or go to ER) Keep up the good work on regular exercise- try and make healthy food choices Call with any questions or concerns Happy Spring!!!

## 2013-12-21 NOTE — Progress Notes (Signed)
Pre visit review using our clinic review tool, if applicable. No additional management support is needed unless otherwise documented below in the visit note. 

## 2013-12-21 NOTE — Assessment & Plan Note (Signed)
New.  Pt had 1 self limited episode lasting 10-15 seconds 2 weeks ago.  Offered neuro referral- pt declined.  Discussed CT- pt declined.  Encouraged pt to monitor sxs and call immediately if they recur.  Pt expressed understanding and is in agreement w/ plan.

## 2013-12-21 NOTE — Progress Notes (Signed)
   Subjective:    Patient ID: Virginia Matthews, female    DOB: 12/15/1955, 58 y.o.   MRN: [id number]  HPI CPE- UTD on colonoscopy, mammo.  Due for pap (GYN- [facility])  10-15 second episode of 'foggy feeling', 'dizziness', R sided facial numbness.  No slurred speech, facial drooping.  Has not had similar episode since.  Occurred 2 weeks ago.   Review of Systems Patient reports no vision/ hearing changes, adenopathy,fever, weight change,  persistant/recurrent hoarseness , swallowing issues, chest pain, palpitations, edema, persistant/recurrent cough, hemoptysis, dyspnea (rest/exertional/paroxysmal nocturnal), gastrointestinal bleeding (melena, rectal bleeding), abdominal pain, significant heartburn, bowel changes, GU symptoms (dysuria, hematuria, incontinence), Gyn symptoms (abnormal  bleeding, pain),  syncope, focal weakness, memory loss, skin/hair/nail changes, abnormal bruising or bleeding, anxiety, or depression.     Objective:   Physical Exam General Appearance:    Alert, cooperative, no distress, appears stated age, morbidly obese  Head:    Normocephalic, without obvious abnormality, atraumatic  Eyes:    PERRL, conjunctiva/corneas clear, EOM's intact, fundi    benign, both eyes  Ears:    Normal TM's and external ear canals, both ears  Nose:   Nares normal, septum midline, mucosa normal, no drainage    or sinus tenderness  Throat:   Lips, mucosa, and tongue normal; teeth and gums normal  Neck:   Supple, symmetrical, trachea midline, no adenopathy;    Thyroid: no enlargement/tenderness/nodules  Back:     Symmetric, no curvature, ROM normal, no CVA tenderness  Lungs:     Clear to auscultation bilaterally, respirations unlabored  Chest Wall:    No tenderness or deformity   Heart:    Regular rate and rhythm, S1 and S2 normal, no murmur, rub   or gallop  Breast Exam:    Deferred to GYN  Abdomen:     Soft, non-tender, bowel sounds active all four quadrants,    no masses, no  organomegaly  Genitalia:    Deferred to GYN  Rectal:    Extremities:   Extremities normal, atraumatic, no cyanosis or edema  Pulses:   2+ and symmetric all extremities  Skin:   Skin color, texture, turgor normal, no rashes or lesions  Lymph nodes:   Cervical, supraclavicular, and axillary nodes normal  Neurologic:   CNII-XII intact, normal strength, sensation and reflexes    throughout          Assessment & Plan:

## 2013-12-26 LAB — VITAMIN D 1,25 DIHYDROXY
Vitamin D 1, 25 (OH)2 Total: 67 pg/mL (ref 18–72)
Vitamin D2 1, 25 (OH)2: <8 pg/mL
Vitamin D3 1, 25 (OH)2: 67 pg/mL

## 2014-02-14 ENCOUNTER — Other Ambulatory Visit: Payer: Self-pay | Admitting: Family Medicine

## 2014-02-14 NOTE — Telephone Encounter (Signed)
Med filled.  

## 2014-03-09 ENCOUNTER — Ambulatory Visit (HOSPITAL_BASED_OUTPATIENT_CLINIC_OR_DEPARTMENT_OTHER)
Admission: RE | Admit: 2014-03-09 | Discharge: 2014-03-09 | Disposition: A | Discharge status: Home / Self Care | Payer: 59 | Source: Ambulatory Visit | Attending: Physician Assistant | Admitting: Physician Assistant

## 2014-03-09 ENCOUNTER — Encounter: Payer: Self-pay | Admitting: Physician Assistant

## 2014-03-09 ENCOUNTER — Ambulatory Visit (INDEPENDENT_AMBULATORY_CARE_PROVIDER_SITE_OTHER): Payer: 59 | Admitting: Physician Assistant

## 2014-03-09 ENCOUNTER — Telehealth: Payer: Self-pay | Admitting: *Deleted

## 2014-03-09 ENCOUNTER — Telehealth: Payer: Self-pay | Admitting: Physician Assistant

## 2014-03-09 VITALS — BP 124/76 | HR 59 | Temp 98.3°F | Resp 18 | Ht 66.0 in | Wt 315.2 lb

## 2014-03-09 DIAGNOSIS — S99919A Unspecified injury of unspecified ankle, initial encounter: Secondary | ICD-10-CM

## 2014-03-09 DIAGNOSIS — S8990XA Unspecified injury of unspecified lower leg, initial encounter: Secondary | ICD-10-CM

## 2014-03-09 DIAGNOSIS — S93409A Sprain of unspecified ligament of unspecified ankle, initial encounter: Secondary | ICD-10-CM

## 2014-03-09 DIAGNOSIS — M7730 Calcaneal spur, unspecified foot: Secondary | ICD-10-CM | POA: Insufficient documentation

## 2014-03-09 DIAGNOSIS — S92309A Fracture of unspecified metatarsal bone(s), unspecified foot, initial encounter for closed fracture: Secondary | ICD-10-CM | POA: Insufficient documentation

## 2014-03-09 DIAGNOSIS — M25579 Pain in unspecified ankle and joints of unspecified foot: Secondary | ICD-10-CM | POA: Insufficient documentation

## 2014-03-09 DIAGNOSIS — S99929A Unspecified injury of unspecified foot, initial encounter: Secondary | ICD-10-CM

## 2014-03-09 DIAGNOSIS — S99911A Unspecified injury of right ankle, initial encounter: Secondary | ICD-10-CM

## 2014-03-09 DIAGNOSIS — S92351A Displaced fracture of fifth metatarsal bone, right foot, initial encounter for closed fracture: Secondary | ICD-10-CM

## 2014-03-09 MED ORDER — AIRCAST SPORT ANKLE BRACE/RGHT MISC: Status: DC

## 2014-03-09 NOTE — Assessment & Plan Note (Signed)
Likely sprained.  Patient is able to ambulate > 4 steps in office. Will obtain x-ray to r/o fracture. RICE therapy.  Rx for Air brace given.  Ibuprofen for pain.  If fracture, will refer to Ortho. Otherwise, patient to start ankle exercises as directed in clinic.

## 2014-03-09 NOTE — Telephone Encounter (Signed)
X-ray results are in.  No evidence of fracture of the ankle bones, just swelling consistent with sprain.   Her foot x-ray however shows fracture of the fifth metatarsal in her foot -- near where some of her current pain was.  I am setting her up with orthopedics.  She will be contacted with appointment.   Avoid putting too much weight on effected extremity until orthopedic evaluation.  Continue care as discussed at today's visit.

## 2014-03-09 NOTE — Telephone Encounter (Signed)
Patient informed, understood & agreed/SLS  

## 2014-03-09 NOTE — Progress Notes (Signed)
Patient presents to clinic today c/o pain and swelling of lateral right ankle after tripping and falling down 2 steps yesterday evening.  Patient endorses twisting her ankle during fall.  Denies injury to head/neck.  Denies injury elsewhere.  Patient was able to get up and walk > 4 steps.  Denies numbness or tingling of foot. Denies bruising. Endorses significant swelling.  Has been placing ice on ankle. Patient denies coolness, pallor or cyanosis of affected foot/ankle.  Past Medical History  Diagnosis Date  . Ruptured lumbar disc     L5/S1  . GERD (gastroesophageal reflux disease)   . Seasonal allergies   . PCO (polycystic ovaries)   . Hypertension   . Obesity   . Endometriosis   . Rosacea   . Psoriasis     Current Outpatient Prescriptions on File Prior to Visit  Medication Sig Dispense Refill  . aspirin 81 MG tablet Take 81 mg by mouth daily.      . Calcitriol (VECTICAL) 3 MCG/GM cream Apply 1 application topically at bedtime.      . Clobetasol Propionate (TEMOVATE EX) Apply topically.        Marland Kitchen escitalopram (LEXAPRO) 10 MG tablet TAKE 1 TABLET BY MOUTH DAILY.  30 tablet  PRN  . fluticasone (FLONASE) 50 MCG/ACT nasal spray Place 2 sprays into both nostrils daily.  16 g  3  . hydrocortisone valerate (WEST-CORT) 0.2 % ointment Apply topically. As needed       . lisinopril (PRINIVIL,ZESTRIL) 10 MG tablet TAKE 1 TABLET BY MOUTH DAILY.  30 tablet  6  . loratadine (CLARITIN) 10 MG tablet Take 10 mg by mouth daily.        . Multiple Vitamins-Minerals (CENTRUM SILVER PO) Take by mouth. Use as directed       . naproxen (NAPROSYN) 500 MG tablet Take 250 mg by mouth as needed.       . NON FORMULARY Cream for Psoriasis      . pantoprazole (PROTONIX) 40 MG tablet TAKE 1 TABLET BY MOUTH DAILY.  30 tablet  11  . Pyridoxine HCl (VITAMIN B-6) 250 MG tablet Take 250 mg by mouth daily.      Marland Kitchen VITAMIN D, CHOLECALCIFEROL, PO Take 5,000 Units by mouth daily.      . Omega-3 Fatty Acids (FISH OIL PO) Take  1 capsule by mouth daily.       No current facility-administered medications on file prior to visit.    No Known Allergies  Family History  Problem Relation Age of Onset  . Breast cancer Mother   . Cancer Father     prostate cancer, lymphoma  . Heart disease Father   . Hypertension Father   . Cancer Brother     prostate cancer  . Asthma Brother     History   Social History  . Marital Status: Married    Spouse Name: N/A    Number of Children: N/A  . Years of Education: N/A   Social History Main Topics  . Smoking status: Never Smoker   . Smokeless tobacco: Not on file  . Alcohol Use: No  . Drug Use: No  . Sexual Activity:    Other Topics Concern  . Not on file   Social History Narrative  . No narrative on file   Review of Systems - See HPI.  All other ROS are negative.  BP 124/76  Pulse 59  Temp(Src) 98.3 F (36.8 C) (Oral)  Resp 18  Ht 5\' 6"  (  1.676 m)  Wt 315 lb 4 oz (142.996 kg)  BMI 50.91 kg/m2  SpO2 98%  Physical Exam  Vitals reviewed. Constitutional: She is oriented to person, place, and time and well-developed, well-nourished, and in no distress.  Cardiovascular: Normal rate, regular rhythm and normal heart sounds.   Musculoskeletal:       Right knee: Normal.       Right ankle: She exhibits decreased range of motion and swelling. She exhibits no ecchymosis and no deformity. Tenderness. Lateral malleolus and AITFL tenderness found. No medial malleolus, no head of 5th metatarsal and no proximal fibula tenderness found. Achilles tendon normal.       Right lower leg: Normal.       Right foot: She exhibits tenderness. She exhibits normal range of motion, no bony tenderness and normal capillary refill.  Neurological: She is alert and oriented to person, place, and time.  Skin: Skin is warm and dry. No rash noted.  Psychiatric: Affect normal.   Recent Results (from the past 2160 hour(s))  HM MAMMOGRAPHY     Status: None   Collection Time    12/19/13  12:00 AM      Result Value Ref Range   HM Mammogram normal    LIPID PANEL     Status: Abnormal   Collection Time    12/21/13  8:40 AM      Result Value Ref Range   Cholesterol 168  0 - 200 mg/dL   Comment: ATP III Classification       Desirable:  < 200 mg/dL               Borderline High:  200 - 239 mg/dL          High:  > = 240 mg/dL   Triglycerides 85.0  0.0 - 149.0 mg/dL   Comment: Normal:  <150 mg/dLBorderline High:  150 - 199 mg/dL   HDL 49.70  >39.00 mg/dL   VLDL 17.0  0.0 - 40.0 mg/dL   LDL Cholesterol 101 (*) 0 - 99 mg/dL   Total CHOL/HDL Ratio 3     Comment:                Men          Women1/2 Average Risk     3.4          3.3Average Risk          5.0          4.42X Average Risk          9.6          7.13X Average Risk          15.0          11.0                      BASIC METABOLIC PANEL     Status: Abnormal   Collection Time    12/21/13  8:40 AM      Result Value Ref Range   Sodium 138  135 - 145 mEq/L   Potassium 4.2  3.5 - 5.1 mEq/L   Chloride 104  96 - 112 mEq/L   CO2 27  19 - 32 mEq/L   Glucose, Bld 112 (*) 70 - 99 mg/dL   BUN 13  6 - 23 mg/dL   Creatinine, Ser 0.8  0.4 - 1.2 mg/dL   Calcium 9.5  8.4 - 10.5 mg/dL   GFR 77.27  >  60.00 mL/min  TSH     Status: None   Collection Time    12/21/13  8:40 AM      Result Value Ref Range   TSH 1.74  0.35 - 5.50 uIU/mL  HEPATIC FUNCTION PANEL     Status: None   Collection Time    12/21/13  8:40 AM      Result Value Ref Range   Total Bilirubin 0.5  0.3 - 1.2 mg/dL   Bilirubin, Direct 0.0  0.0 - 0.3 mg/dL   Alkaline Phosphatase 77  39 - 117 U/L   AST 30  0 - 37 U/L   ALT 30  0 - 35 U/L   Total Protein 7.5  6.0 - 8.3 g/dL   Albumin 4.1  3.5 - 5.2 g/dL  CBC WITH DIFFERENTIAL     Status: None   Collection Time    12/21/13  8:40 AM      Result Value Ref Range   WBC 7.8  4.5 - 10.5 K/uL   RBC 4.47  3.87 - 5.11 Mil/uL   Hemoglobin 13.5  12.0 - 15.0 g/dL   HCT 40.8  36.0 - 46.0 %   MCV 91.3  78.0 - 100.0 fl   MCHC  33.1  30.0 - 36.0 g/dL   RDW 14.2  11.5 - 14.6 %   Platelets 273.0  150.0 - 400.0 K/uL   Neutrophils Relative % 71.8  43.0 - 77.0 %   Lymphocytes Relative 17.9  12.0 - 46.0 %   Monocytes Relative 5.7  3.0 - 12.0 %   Eosinophils Relative 4.0  0.0 - 5.0 %   Basophils Relative 0.6  0.0 - 3.0 %   Neutro Abs 5.6  1.4 - 7.7 K/uL   Lymphs Abs 1.4  0.7 - 4.0 K/uL   Monocytes Absolute 0.4  0.1 - 1.0 K/uL   Eosinophils Absolute 0.3  0.0 - 0.7 K/uL   Basophils Absolute 0.0  0.0 - 0.1 K/uL  VITAMIN D 1,25 DIHYDROXY     Status: None   Collection Time    12/21/13  8:40 AM      Result Value Ref Range   Vitamin D 1, 25 (OH)2 Total 67  18 - 72 pg/mL   Vitamin D3 1, 25 (OH)2 67     Vitamin D2 1, 25 (OH)2 <8     Comment: Vitamin D3, 1,25(OH)2 indicates both endogenous     production and supplementation.  Vitamin D2, 1,25(OH)2     is an indicator of exogeous sources, such as diet or     supplementation.  Interpretation and therapy are based     on measurement of Vitamin D,1,25(OH)2, Total.     This test was developed and its performance     characteristics have been determined by [facility], [place], [place].     Performance characteristics refer to the     analytical performance of the test.  HEMOGLOBIN A1C     Status: None   Collection Time    12/21/13  8:40 AM      Result Value Ref Range   Hemoglobin A1C 5.6  4.6 - 6.5 %   Comment: Glycemic Control Guidelines for People with Diabetes:Non Diabetic:  <6%Goal of Therapy: <7%Additional Action Suggested:  >8%     Assessment/Plan: Right ankle injury Likely sprained.  Patient is able to ambulate > 4 steps in office. Will obtain x-ray to r/o fracture. RICE therapy.  Rx for [employer]  brace given.  Ibuprofen for pain.  If fracture, will refer to Ortho. Otherwise, patient to start ankle exercises as directed in clinic.

## 2014-03-09 NOTE — Progress Notes (Signed)
Pre visit review using our clinic review tool, if applicable. No additional management support is needed unless otherwise documented below in the visit note/SLS  

## 2014-03-09 NOTE — Telephone Encounter (Signed)
Called and spoke with patient's husband. Per husband, patient was outside watering flowers and she tripped over the waterhose. She is now having pain and swelling of her right ankle. Patient's husbands states that patient did not hit her head or feel dizzy before she fell. JG//CMA  Patient is scheduled to see Elyn Aquas, PA-C at [facility] today.

## 2014-03-09 NOTE — Patient Instructions (Signed)
Please go downstairs for imaging.  I will call you with your results.  Please stop by a pharmacy or medical supply store for air brace.  Please wear while walking.  Keep extremity elevated while resting.  Apply ice in 15-minute intervals throughout the day. Take Ibuprofen or Aleve for pain. Call or return to clinic if symptoms are not improving.  If there is a fracture present, I will set you up with Orthopedics.

## 2014-04-19 ENCOUNTER — Ambulatory Visit: Payer: 59 | Attending: Sports Medicine | Admitting: Physical Therapy

## 2014-04-19 DIAGNOSIS — IMO0001 Reserved for inherently not codable concepts without codable children: Secondary | ICD-10-CM | POA: Diagnosis present

## 2014-04-19 DIAGNOSIS — M25579 Pain in unspecified ankle and joints of unspecified foot: Secondary | ICD-10-CM | POA: Insufficient documentation

## 2014-04-24 ENCOUNTER — Ambulatory Visit: Payer: 59 | Admitting: Physical Therapy

## 2014-04-24 DIAGNOSIS — IMO0001 Reserved for inherently not codable concepts without codable children: Secondary | ICD-10-CM | POA: Diagnosis not present

## 2014-04-26 ENCOUNTER — Encounter: Payer: Self-pay | Admitting: Internal Medicine

## 2014-05-01 ENCOUNTER — Ambulatory Visit: Payer: 59 | Attending: Family Medicine | Admitting: Rehabilitation

## 2014-05-01 DIAGNOSIS — M25579 Pain in unspecified ankle and joints of unspecified foot: Secondary | ICD-10-CM | POA: Insufficient documentation

## 2014-05-01 DIAGNOSIS — IMO0001 Reserved for inherently not codable concepts without codable children: Secondary | ICD-10-CM | POA: Diagnosis not present

## 2014-05-04 ENCOUNTER — Ambulatory Visit: Payer: 59 | Admitting: Physical Therapy

## 2014-05-04 DIAGNOSIS — IMO0001 Reserved for inherently not codable concepts without codable children: Secondary | ICD-10-CM | POA: Diagnosis not present

## 2014-05-08 ENCOUNTER — Ambulatory Visit: Payer: 59

## 2014-05-08 DIAGNOSIS — IMO0001 Reserved for inherently not codable concepts without codable children: Secondary | ICD-10-CM | POA: Diagnosis not present

## 2014-05-11 ENCOUNTER — Ambulatory Visit: Payer: 59

## 2014-05-11 DIAGNOSIS — IMO0001 Reserved for inherently not codable concepts without codable children: Secondary | ICD-10-CM | POA: Diagnosis not present

## 2014-05-15 ENCOUNTER — Ambulatory Visit: Payer: 59 | Admitting: Rehabilitation

## 2014-05-15 DIAGNOSIS — IMO0001 Reserved for inherently not codable concepts without codable children: Secondary | ICD-10-CM | POA: Diagnosis not present

## 2014-05-17 ENCOUNTER — Ambulatory Visit: Payer: 59

## 2014-06-25 ENCOUNTER — Ambulatory Visit (INDEPENDENT_AMBULATORY_CARE_PROVIDER_SITE_OTHER): Payer: 59 | Admitting: Family Medicine

## 2014-06-25 ENCOUNTER — Other Ambulatory Visit: Payer: Self-pay | Admitting: General Practice

## 2014-06-25 ENCOUNTER — Encounter: Payer: Self-pay | Admitting: Family Medicine

## 2014-06-25 VITALS — BP 122/74 | HR 72 | Temp 97.8°F | Resp 16 | Wt 322.0 lb

## 2014-06-25 DIAGNOSIS — E785 Hyperlipidemia, unspecified: Secondary | ICD-10-CM | POA: Insufficient documentation

## 2014-06-25 DIAGNOSIS — I10 Essential (primary) hypertension: Secondary | ICD-10-CM

## 2014-06-25 LAB — CBC WITH DIFFERENTIAL/PLATELET
Basophils Absolute: 0.1 10*3/uL (ref 0.0–0.1)
Basophils Relative: 0.7 % (ref 0.0–3.0)
Eosinophils Absolute: 0.3 10*3/uL (ref 0.0–0.7)
Eosinophils Relative: 3.9 % (ref 0.0–5.0)
HCT: 37.8 % (ref 36.0–46.0)
Hemoglobin: 12.6 g/dL (ref 12.0–15.0)
Lymphocytes Relative: 17.8 % (ref 12.0–46.0)
Lymphs Abs: 1.3 10*3/uL (ref 0.7–4.0)
MCHC: 33.2 g/dL (ref 30.0–36.0)
MCV: 91.5 fl (ref 78.0–100.0)
Monocytes Absolute: 0.5 10*3/uL (ref 0.1–1.0)
Monocytes Relative: 6.4 % (ref 3.0–12.0)
Neutro Abs: 5.3 10*3/uL (ref 1.4–7.7)
Neutrophils Relative %: 71.2 % (ref 43.0–77.0)
Platelets: 228 10*3/uL (ref 150.0–400.0)
RBC: 4.14 Mil/uL (ref 3.87–5.11)
RDW: 13.9 % (ref 11.5–15.5)
WBC: 7.5 10*3/uL (ref 4.0–10.5)

## 2014-06-25 LAB — BASIC METABOLIC PANEL
BUN: 19 mg/dL (ref 6–23)
CO2: 25 mEq/L (ref 19–32)
Calcium: 9.1 mg/dL (ref 8.4–10.5)
Chloride: 103 mEq/L (ref 96–112)
Creatinine, Ser: 0.9 mg/dL (ref 0.4–1.2)
GFR: 71.03 mL/min (ref >60.00)
Glucose, Bld: 103 mg/dL — ABNORMAL HIGH (ref 70–99)
Potassium: 4.1 mEq/L (ref 3.5–5.1)
Sodium: 137 mEq/L (ref 135–145)

## 2014-06-25 LAB — HEPATIC FUNCTION PANEL
ALT: 23 U/L (ref 0–35)
AST: 28 U/L (ref 0–37)
Albumin: 3.8 g/dL (ref 3.5–5.2)
Alkaline Phosphatase: 75 U/L (ref 39–117)
Bilirubin, Direct: 0 mg/dL (ref 0.0–0.3)
Total Bilirubin: 0.4 mg/dL (ref 0.2–1.2)
Total Protein: 7.3 g/dL (ref 6.0–8.3)

## 2014-06-25 LAB — LIPID PANEL
Cholesterol: 162 mg/dL (ref 0–200)
HDL: 47 mg/dL (ref >39.00)
LDL Cholesterol: 98 mg/dL (ref 0–99)
NonHDL: 115
Total CHOL/HDL Ratio: 3
Triglycerides: 83 mg/dL (ref 0.0–149.0)
VLDL: 16.6 mg/dL (ref 0.0–40.0)

## 2014-06-25 LAB — TSH: TSH: 1.78 u[IU]/mL (ref 0.35–4.50)

## 2014-06-25 MED ORDER — FLUTICASONE PROPIONATE 50 MCG/ACT NA SUSP: 2.0000 | Freq: Every day | NASAL | Status: DC

## 2014-06-25 NOTE — Assessment & Plan Note (Signed)
Pt is now in structured exercise program via [employer].  Discussed need to make healthy food choices.  Will continue to follow.

## 2014-06-25 NOTE — Patient Instructions (Signed)
Schedule your complete physical in March We'll notify you of your lab results and make any changes if needed Keep up the good work on the exercise program- Love this! Call with any questions or concerns Happy Fall!!!

## 2014-06-25 NOTE — Progress Notes (Signed)
Pre visit review using our clinic review tool, if applicable. No additional management support is needed unless otherwise documented below in the visit note. 

## 2014-06-25 NOTE — Assessment & Plan Note (Signed)
Chronic problem.  Well controlled.  Asymptomatic.  Check labs.  No anticipated med changes. 

## 2014-06-25 NOTE — Progress Notes (Signed)
   Subjective:    Patient ID: Virginia Matthews, female    DOB: 14-Feb-1956, 58 y.o.   MRN: [id number]  HPI HTN- chronic problem, on Lisinopril.  Denies CP, SOB, HAs, visual changes, edema.  Exercising 3x/week.  Pt doing structured exercise plan via [employer].    Hyperlipidemia- chronic problem, on Omega 3 fatty acids.  Now exercising regularly.  No abd pain, N/V.   Review of Systems For ROS see HPI     Objective:   Physical Exam  Vitals reviewed. Constitutional: She is oriented to person, place, and time. She appears well-developed and well-nourished. No distress.  HENT:  Head: Normocephalic and atraumatic.  Eyes: Conjunctivae and EOM are normal. Pupils are equal, round, and reactive to light.  Neck: Normal range of motion. Neck supple. No thyromegaly present.  Cardiovascular: Normal rate, regular rhythm, normal heart sounds and intact distal pulses.   No murmur heard. Pulmonary/Chest: Effort normal and breath sounds normal. No respiratory distress.  Abdominal: Soft. She exhibits no distension. There is no tenderness.  Musculoskeletal: She exhibits no edema.  Lymphadenopathy:    She has no cervical adenopathy.  Neurological: She is alert and oriented to person, place, and time.  Skin: Skin is warm and dry.  Psychiatric: She has a normal mood and affect. Her behavior is normal.          Assessment & Plan:

## 2014-07-04 ENCOUNTER — Other Ambulatory Visit: Payer: Self-pay | Admitting: Family Medicine

## 2014-07-04 NOTE — Telephone Encounter (Signed)
Med filled.  

## 2014-07-30 ENCOUNTER — Encounter: Payer: Self-pay | Admitting: Family Medicine

## 2014-08-14 ENCOUNTER — Other Ambulatory Visit: Payer: Self-pay | Admitting: General Practice

## 2014-08-14 MED ORDER — ESCITALOPRAM OXALATE 10 MG PO TABS: 10.0000 mg | ORAL_TABLET | Freq: Every day | ORAL | Status: DC

## 2014-11-20 ENCOUNTER — Encounter: Payer: Self-pay | Admitting: Family Medicine

## 2014-12-13 ENCOUNTER — Telehealth: Payer: Self-pay | Admitting: Family Medicine

## 2014-12-13 NOTE — Telephone Encounter (Signed)
Pre visit letter sent  °

## 2014-12-14 ENCOUNTER — Other Ambulatory Visit: Payer: Self-pay | Admitting: General Practice

## 2014-12-14 MED ORDER — ESCITALOPRAM OXALATE 10 MG PO TABS: 10.0000 mg | ORAL_TABLET | Freq: Every day | ORAL | Status: DC

## 2014-12-24 ENCOUNTER — Encounter: Payer: Self-pay | Admitting: Internal Medicine

## 2015-01-01 ENCOUNTER — Other Ambulatory Visit: Payer: Self-pay | Admitting: Family Medicine

## 2015-01-01 ENCOUNTER — Ambulatory Visit (INDEPENDENT_AMBULATORY_CARE_PROVIDER_SITE_OTHER): Payer: 59 | Admitting: Family Medicine

## 2015-01-01 ENCOUNTER — Encounter: Payer: Self-pay | Admitting: Family Medicine

## 2015-01-01 VITALS — Ht 66.0 in | Wt 296.8 lb

## 2015-01-01 DIAGNOSIS — E669 Obesity, unspecified: Secondary | ICD-10-CM

## 2015-01-01 DIAGNOSIS — Z1231 Encounter for screening mammogram for malignant neoplasm of breast: Secondary | ICD-10-CM

## 2015-01-01 NOTE — Progress Notes (Signed)
Medical Nutrition Therapy:  Appt start time: 1100 end time:  1200.  Assessment:  Primary concerns today: Weight management.  Virginia Matthews has a goal of being under 200 lb by the time she is 23 in July 2017.    Learning Readiness:  Change in progress; Virginia Matthews started exercising a couple yrs ago, and started getting serious about wt loss in January; has lost 37 lb since then.  She tries to keep most junk foods out of the house, but has a 19-YO son living at home currently, so there is usually something readily available.    Barriers to learning/adherence to lifestyle change: stress eating; dealing with some challenges at home just now, mostly related to her husband's health problems.    Usual eating pattern includes 3 meals and 1 snack per day. Frequent foods and beverages include coffee w/ cream, water, bkfst of 1 egg & 1 slc bread or granola bar, yogurt (150 kcal), apple or banana, celery, carrots; lunch of berries & yogurt or 1/2 turk  sandw & fruit or granola bar and fruit; dinner of chx/other pro, 2 vegs, and starch.  Avoided foods include none; tries to keep sweets out of the house.   Usual physical activity includes gym 60 min:~20 min cardio (walk/bike); wts & stretching 3 X wk; 30+ min evening walk 6 X wk.    24-hr recall: (Up at 7:30 AM) B (8:30 AM)-   1 slc bread, 1 scrmbld egg in spray oil, coffee, >1 tbsp creamer Snk ( AM)-    L (1 PM)-  5 oz Grk yogurt, 2 c berries, 1/4 c granola, water Snk (3 PM)-  2 c celery and carrots, water Snk (6:30)-  1 c Cheetos, 6 Dbl Stuf Oreos D (7:15 PM)-  1 small tortilla, 4 oz chx brst, onion, pep, 1/4 avocado, tom, let, 1/2 c rice, water Snk (8 PM)-  4 choc-cov'd almonds Typical day? Yes.  except for the stress eating at 6:30, which she had not been doing lately.    Progress Towards Goal(s):  In progress.   Nutritional Diagnosis:  May-3.3 Overweight/obesity As related to energy balance.  As evidenced by BMI >40.    Intervention:  Nutrition  counseling.  Handouts given during visit include:  AVS  Recipes  Feelings and Needs lists  Demonstrated degree of understanding via:  Teach Back   Monitoring/Evaluation:  Dietary intake, exercise, and body weight in 6 week(s).

## 2015-01-01 NOTE — Patient Instructions (Signed)
-   Experiment with breads and bread products:  Tortillas, wraps (including hi-protein), Eng muffins, [place] bakery bread, homemade corn or wholewheat muffins.   - Breakfast:  Consider 2 eggs and 1-2 slices of toast.   - At lunch, be sure you eat enough to be satisfied.   - Workout days: Have some carb and protein within 30 min of your workout, preferably.    - Examples:  Natural pb on bread OR yogurt with fruit and/or 1-2 tbsp nuts/seeds OR protein bar or shake.    - When you are struggling with a food decision:   - Using your lists of Feelings and Needs, ask yourself these three questions, and write your responses: Decoding Questions: 1. What am I feeling right now? Burdened, disengaged, empty, frustrated, guilty, irritated, negative, pessimistic,  2. What do I want to feel?  Balanced, comfort, content, happy, hopeful, supported, sympathetic 3. What do I truly need right now? Communication, self-respect, better understanding of his situation, sleep, rest, physical activity  After each question, ask yourself, "Is there anything more?" You are looking for feelings, not thoughts.   Bring your responses to your follow-up appt.    - Set "appt" times to remind you to walk for just few minutes:  - Microbreaks of 1 minute per 20 min sitting OR 5 minutes per 60 min sitting.

## 2015-01-02 ENCOUNTER — Encounter: Payer: Self-pay | Admitting: *Deleted

## 2015-01-02 ENCOUNTER — Telehealth: Payer: Self-pay | Admitting: *Deleted

## 2015-01-02 NOTE — Telephone Encounter (Signed)
Pre-Visit Call completed with patient and chart updated.   Pre-Visit Info documented in Specialty Comments under Snap[employer].    

## 2015-01-03 ENCOUNTER — Ambulatory Visit (INDEPENDENT_AMBULATORY_CARE_PROVIDER_SITE_OTHER): Payer: 59 | Admitting: Family Medicine

## 2015-01-03 ENCOUNTER — Encounter: Payer: Self-pay | Admitting: Family Medicine

## 2015-01-03 VITALS — BP 120/76 | HR 66 | Temp 98.0°F | Resp 16 | Ht 66.0 in | Wt 296.4 lb

## 2015-01-03 DIAGNOSIS — Z0000 Encounter for general adult medical examination without abnormal findings: Secondary | ICD-10-CM

## 2015-01-03 LAB — HEPATIC FUNCTION PANEL
ALT: 23 U/L (ref 0–35)
AST: 24 U/L (ref 0–37)
Albumin: 3.9 g/dL (ref 3.5–5.2)
Alkaline Phosphatase: 81 U/L (ref 39–117)
Bilirubin, Direct: 0.1 mg/dL (ref 0.0–0.3)
Total Bilirubin: 0.4 mg/dL (ref 0.2–1.2)
Total Protein: 7.3 g/dL (ref 6.0–8.3)

## 2015-01-03 LAB — BASIC METABOLIC PANEL
BUN: 18 mg/dL (ref 6–23)
CO2: 29 mEq/L (ref 19–32)
Calcium: 9.8 mg/dL (ref 8.4–10.5)
Chloride: 103 mEq/L (ref 96–112)
Creatinine, Ser: 0.84 mg/dL (ref 0.40–1.20)
GFR: 73.83 mL/min (ref >60.00)
Glucose, Bld: 103 mg/dL — ABNORMAL HIGH (ref 70–99)
Potassium: 4.4 mEq/L (ref 3.5–5.1)
Sodium: 138 mEq/L (ref 135–145)

## 2015-01-03 LAB — CBC WITH DIFFERENTIAL/PLATELET
Basophils Absolute: 0 10*3/uL (ref 0.0–0.1)
Basophils Relative: 0.5 % (ref 0.0–3.0)
Eosinophils Absolute: 0.4 10*3/uL (ref 0.0–0.7)
Eosinophils Relative: 4.4 % (ref 0.0–5.0)
HCT: 39.8 % (ref 36.0–46.0)
Hemoglobin: 13.5 g/dL (ref 12.0–15.0)
Lymphocytes Relative: 16.6 % (ref 12.0–46.0)
Lymphs Abs: 1.3 10*3/uL (ref 0.7–4.0)
MCHC: 33.9 g/dL (ref 30.0–36.0)
MCV: 89.3 fl (ref 78.0–100.0)
Monocytes Absolute: 0.6 10*3/uL (ref 0.1–1.0)
Monocytes Relative: 6.9 % (ref 3.0–12.0)
Neutro Abs: 5.8 10*3/uL (ref 1.4–7.7)
Neutrophils Relative %: 71.6 % (ref 43.0–77.0)
Platelets: 247 10*3/uL (ref 150.0–400.0)
RBC: 4.46 Mil/uL (ref 3.87–5.11)
RDW: 14 % (ref 11.5–15.5)
WBC: 8.1 10*3/uL (ref 4.0–10.5)

## 2015-01-03 LAB — LIPID PANEL
Cholesterol: 144 mg/dL (ref 0–200)
HDL: 45.8 mg/dL (ref >39.00)
LDL Cholesterol: 86 mg/dL (ref 0–99)
NonHDL: 98.2
Total CHOL/HDL Ratio: 3
Triglycerides: 63 mg/dL (ref 0.0–149.0)
VLDL: 12.6 mg/dL (ref 0.0–40.0)

## 2015-01-03 LAB — TSH: TSH: 2.28 u[IU]/mL (ref 0.35–4.50)

## 2015-01-03 LAB — VITAMIN D 25 HYDROXY (VIT D DEFICIENCY, FRACTURES): VITD: 63.17 ng/mL (ref 30.00–100.00)

## 2015-01-03 MED ORDER — LISINOPRIL 10 MG PO TABS: 10.0000 mg | ORAL_TABLET | Freq: Every day | ORAL | Status: DC

## 2015-01-03 MED ORDER — ESCITALOPRAM OXALATE 10 MG PO TABS: 10.0000 mg | ORAL_TABLET | Freq: Every day | ORAL | Status: DC

## 2015-01-03 MED ORDER — PANTOPRAZOLE SODIUM 40 MG PO TBEC: 40.0000 mg | DELAYED_RELEASE_TABLET | Freq: Every day | ORAL | Status: DC

## 2015-01-03 NOTE — Progress Notes (Signed)
Pre visit review using our clinic review tool, if applicable. No additional management support is needed unless otherwise documented below in the visit note. 

## 2015-01-03 NOTE — Patient Instructions (Signed)
Follow up in 6 months to recheck BP We'll notify you of your lab results and make any changes if needed Check on your Tdap status w/ employee health Call Dr Henrene Pastor for the repeat colonoscopy Call and schedule your pap smear at your convenience Call with any questions or concerns Keep up the good work on healthy diet and regular exercise- you look great!!! Happy Spring!!!

## 2015-01-03 NOTE — Progress Notes (Signed)
   Subjective:    Patient ID: Virginia Matthews, female    DOB: 11-25-1955, 59 y.o.   MRN: [id number]  HPI CPE- due for 5 yr colonoscopy recall (Dr Ardis Hughs), mammo (scheduled), and pap ([facility]).  Pt is down ~30 lbs from last visit (pt reports 38 lbs).  Exercising 3x/week at the gym and walking 5-6 days/week.   Review of Systems Patient reports no vision/ hearing changes, adenopathy,fever, weight change,  persistant/recurrent hoarseness , swallowing issues, chest pain, palpitations, edema, persistant/recurrent cough, hemoptysis, dyspnea (rest/exertional/paroxysmal nocturnal), gastrointestinal bleeding (melena, rectal bleeding), abdominal pain, significant heartburn, bowel changes, GU symptoms (dysuria, hematuria, incontinence), Gyn symptoms (abnormal  bleeding, pain),  syncope, focal weakness, memory loss, numbness & tingling, skin/hair/nail changes, abnormal bruising or bleeding, anxiety, or depression.     Objective:   Physical Exam General Appearance:    Alert, cooperative, no distress, appears stated age  Head:    Normocephalic, without obvious abnormality, atraumatic  Eyes:    PERRL, conjunctiva/corneas clear, EOM's intact, fundi    benign, both eyes  Ears:    Normal TM's and external ear canals, both ears  Nose:   Nares normal, septum midline, mucosa normal, no drainage    or sinus tenderness  Throat:   Lips, mucosa, and tongue normal; teeth and gums normal  Neck:   Supple, symmetrical, trachea midline, no adenopathy;    Thyroid: no enlargement/tenderness/nodules  Back:     Symmetric, no curvature, ROM normal, no CVA tenderness  Lungs:     Clear to auscultation bilaterally, respirations unlabored  Chest Wall:    No tenderness or deformity   Heart:    Regular rate and rhythm, S1 and S2 normal, no murmur, rub   or gallop  Breast Exam:    Deferred to GYN  Abdomen:     Soft, non-tender, bowel sounds active all four quadrants,    no masses, no organomegaly  Genitalia:    Deferred  to GYN  Rectal:    Extremities:   Extremities normal, atraumatic, no cyanosis or edema  Pulses:   2+ and symmetric all extremities  Skin:   Skin color, texture, turgor normal, no rashes or lesions  Lymph nodes:   Cervical, supraclavicular, and axillary nodes normal  Neurologic:   CNII-XII intact, normal strength, sensation and reflexes    throughout          Assessment & Plan:

## 2015-01-03 NOTE — Assessment & Plan Note (Signed)
Pt's PE WNL w/ exception of obesity.  Reviewed health maintenance w/ pt and encouraged her to schedule needed appts.  Applauded pt's recent weight loss efforts.  Will continue to follow.  Anticipatory guidance provided.

## 2015-01-24 ENCOUNTER — Ambulatory Visit (HOSPITAL_COMMUNITY)
Admission: RE | Admit: 2015-01-24 | Discharge: 2015-01-24 | Disposition: A | Discharge status: Home / Self Care | Payer: 59 | Source: Ambulatory Visit | Attending: Family Medicine | Admitting: Family Medicine

## 2015-01-24 DIAGNOSIS — Z1231 Encounter for screening mammogram for malignant neoplasm of breast: Secondary | ICD-10-CM | POA: Diagnosis not present

## 2015-02-11 ENCOUNTER — Ambulatory Visit (INDEPENDENT_AMBULATORY_CARE_PROVIDER_SITE_OTHER): Payer: 59 | Admitting: Family Medicine

## 2015-02-11 ENCOUNTER — Encounter: Payer: Self-pay | Admitting: Family Medicine

## 2015-02-11 VITALS — Ht 66.0 in | Wt 292.2 lb

## 2015-02-11 DIAGNOSIS — E669 Obesity, unspecified: Secondary | ICD-10-CM | POA: Diagnosis not present

## 2015-02-11 NOTE — Progress Notes (Signed)
Medical Nutrition Therapy:  Appt start time: 1100 end time:  1200.  Assessment:  Primary concerns today: Weight management.  Virginia Matthews has been weighing herself daily, which causes some anxiety.  She has been struggling some with making good choices recently, and has been walking a bit less (only 2 X wk), mostly related to extra work b/c of the [place] at [employer].  Has been consistent on getting to the gym 3 X wk for 60 min, however.  She admits that she has not always been making sure she is fully satisfied with lunch; feels a little anxious about doing so, but also wonders if her recent increased inclination to snacking may be partly related to not feeling satisfied with lunch.    24-hr recall:  (Up at 8 AM) B (10 AM)-  1 bagel, 1 tbsp butter, 2 eggs in oil, coffee, 2 tbsp cream, 1 c fresh strawberries Snk ( AM)-  water L (2 PM)-  1 c strawberries, 5 cashew clusters (170), 1 square dark choc,  Snk ( PM)-  water D (6:30 PM)-  8 oz steak, small potato, 1 c grn beans, 3/4 c mushrooms & onions, 1 glass wine Snk (8:30)-  1 orange Typical day? No. Virginia Matthews was a Sunday, the only bkfst of the week to include bagels; always eats lunch on weekdays; and usually has wine only on weekends.    Progress Towards Goal(s):  In progress.   Nutritional Diagnosis:  [place]-3.3 Overweight/obesity As related to energy balance.  As evidenced by BMI >40.    Intervention:  Nutrition counseling.  Handouts given during visit include:  AVS  Demonstrated degree of understanding via:  Teach Back   Monitoring/Evaluation:  Dietary intake, exercise, and body weight in 5 week(s).

## 2015-02-11 NOTE — Patient Instructions (Signed)
-   Call a couple of friends to walk with.   - Remember to give attention to getting the most pleasure from your food choices possible.   - Check out this video:  Jess Barters The Truth about Exercise:  Http://vimeo.WKM/62863817 - Alcohol intake over the summer:  Be selective, and think ahead.  Maximize enjoyment.   - Try taking your med's AFTER working out along with food.  Aim for some carb and some protein within 30 min post-workout.    - Your usual eggs and Eng muffin will be great for this.  It could also be a half or whole sandwich or yogurt with fruit and 1-2 tbsp nuts/seeds.    - You may want to try a protein shake (Orgain?). For most pro powders, you can use only a half-dose if mixing in 8-12 oz of skim milk.   - Using your lists of Feelings and Needs, ask yourself these three questions, and write your responses:  1. What am I feeling right now?  2. What do I want to feel?  3. What do I truly need right now? - After each question, ask yourself, "Is there anything more?"  You are looking for feelings, not thoughts.  Bring your responses to your follow-up appt.   - Book:  EAT.Q by Octavio Manns.  Jot down some notes on this as you read.   - Other recommendations:  Gretchen Rubin's Better than Before and The Art of Happiness by the [place] and The Happiness Hypothesis by Georgena Spurling.   - Follow-up:  Mon, June 20 at 4:30

## 2015-03-18 ENCOUNTER — Ambulatory Visit: Payer: 59 | Admitting: Family Medicine

## 2015-03-19 ENCOUNTER — Ambulatory Visit (INDEPENDENT_AMBULATORY_CARE_PROVIDER_SITE_OTHER): Payer: 59 | Admitting: Family Medicine

## 2015-03-19 ENCOUNTER — Encounter: Payer: Self-pay | Admitting: Family Medicine

## 2015-03-19 VITALS — Ht 66.0 in | Wt 287.2 lb

## 2015-03-19 DIAGNOSIS — E669 Obesity, unspecified: Secondary | ICD-10-CM | POA: Diagnosis not present

## 2015-03-19 NOTE — Patient Instructions (Signed)
-   Using your lists of Feelings and Needs, ask yourself these three questions, and write your responses:  1. What am I feeling right now?  2. What do I want to feel?  3. What do I truly need right now? After each question, ask yourself, "Is there anything more?" You are looking for feelings, not thoughts.   Bring your responses to your follow-up appt.    - Food record daily.  Get a book/notebook to record your intake.  This may be a good place also to write response to your Decoding Qs.   - Get started on your books - and with a highlighter.

## 2015-03-19 NOTE — Progress Notes (Signed)
Medical Nutrition Therapy:  Appt start time: 1100 end time:  1200.  Assessment:  Primary concerns today: Weight management.  Virginia Matthews has been making good choices most days, with the exception of last Fri, when she overate some sweets, which she identified as stress eating.  She has not written down answers to the Decoding Qs, but has used them somewhat.   Virginia Matthews is going to the gym 2-3 X wk, doing yoga 2 X wk, and walking 2-3 X wk with friends (Sat AM, Mon PM, [place] PM).    Virginia Matthews has purchased both books, EAT.Q. and Better Than Before, but has not yet started them.  She has a vacation coming up next week, however, which should allow for some good reading time.    24-hr recall:  (Up at 5:45 AM) Workout from 6:30-6:40 at [facility] B (8 AM)-  2 c coffee, 2 tbsp cream, 1 Eng muffin, 2 scrmbld eggs,  Snk ( AM)-  water L (12:30)-  1 [place] sandw, mustard, 1 apple Snk (3 PM)-  1 banana, water D (6:30 PM)-  6 oz tuna, 1 c green beans, 1 c tom- cuc salad, 1 tbsp vinaigrette, 1/2 c potato salad Snk ( PM)-  --- Typical day? Yes.    This was a pretty good day, i.e., no inappropriate snacks.    Progress Towards Goal(s):  In progress.   Nutritional Diagnosis:  [place]-3.3 Overweight/obesity As related to energy balance.  As evidenced by BMI >40.    Intervention:  Nutrition counseling.  Handouts given during visit include:  AVS  Demonstrated degree of understanding via:  Teach Back   Monitoring/Evaluation:  Dietary intake, exercise, and body weight in 7 week(s).

## 2015-05-07 ENCOUNTER — Ambulatory Visit (INDEPENDENT_AMBULATORY_CARE_PROVIDER_SITE_OTHER): Payer: 59 | Admitting: Family Medicine

## 2015-05-07 ENCOUNTER — Encounter: Payer: Self-pay | Admitting: Family Medicine

## 2015-05-07 VITALS — Ht 66.0 in | Wt 281.1 lb

## 2015-05-07 DIAGNOSIS — E669 Obesity, unspecified: Secondary | ICD-10-CM

## 2015-05-07 NOTE — Patient Instructions (Addendum)
-   Measure more frequently when choosing your foods.   - If you travel this month, think ahead about your exercise and food options.   - Review the first section of EAT.Q., and write down what especially resonated with you.   - Look for at least two activities/exercises suggested to try (at least one questionnaire and one activity).  - Continue reading the book as time allows.    - Journaling (including your 3 Decoding Qs):  Don't pressure yourself to do these until the Olympics are over.

## 2015-05-07 NOTE — Progress Notes (Signed)
Medical Nutrition Therapy:  Appt start time: 1630 end time:  1700.  Assessment:  Primary concerns today: Weight management.   Virginia Matthews has been on vacation twice since last appt as well as travel for work.  Food choices and exercise have been good when home.  She has just finished the first part of EAT.Q.  She feels she has gained some good insight into the connection between food and emotions.   She has continued exercise consistently: 1 hour at the gym 2 X wk (cardio and wts), 1 hr of yoga 2 X wk, and 1 hour walking at least 1 X wk with a friend.    24-hr recall:  (Up at 6 AM; to the gym at 6:30 AM) B (8:15 AM)-  2 eggs, 1 1/2 slc toast, coffee, 2 T cream Snk ( AM)-  water L (1 PM)-  4 oz Grk yogurt (140), 1 1/2 c berries, 1/4 granola, 1 c cherry tomatoes, water Snk ( PM)-  water D (7:15 PM)-  1 [place] burger, feta and spinach, cuc, let, tomatoes, 2 T tziki sauce (40 kcal), 1 c bean-corn salad Snk (9 PM)-  1 peach, small bl'ber muffin Typical day? No.  Usually has a starch food at dinner, but bkfst and lunch were typical.    Progress Towards Goal(s):  In progress.   Nutritional Diagnosis:  Steady progress toward goal of [place]-3.3 Overweight/obesity As related to energy balance.  As evidenced by weight loss of nearly a pound a week since appt 7 wks ago.    Intervention:  Nutrition counseling.  Handouts given during visit include:  AVS  Demonstrated degree of understanding via:  Teach Back   Monitoring/Evaluation:  Dietary intake, exercise, and body weight in 7 week(s).

## 2015-06-24 ENCOUNTER — Ambulatory Visit: Payer: 59 | Admitting: Family Medicine

## 2015-07-05 ENCOUNTER — Encounter: Payer: Self-pay | Admitting: Family Medicine

## 2015-07-05 ENCOUNTER — Ambulatory Visit (INDEPENDENT_AMBULATORY_CARE_PROVIDER_SITE_OTHER): Payer: 59 | Admitting: Family Medicine

## 2015-07-05 VITALS — BP 116/70 | HR 87 | Temp 98.4°F | Ht 66.0 in | Wt 280.2 lb

## 2015-07-05 DIAGNOSIS — M2170 Unequal limb length (acquired), unspecified site: Secondary | ICD-10-CM

## 2015-07-05 DIAGNOSIS — M25532 Pain in left wrist: Secondary | ICD-10-CM | POA: Diagnosis not present

## 2015-07-05 DIAGNOSIS — M25551 Pain in right hip: Secondary | ICD-10-CM

## 2015-07-05 DIAGNOSIS — I10 Essential (primary) hypertension: Secondary | ICD-10-CM | POA: Diagnosis not present

## 2015-07-05 DIAGNOSIS — IMO0002 Reserved for concepts with insufficient information to code with codable children: Secondary | ICD-10-CM | POA: Insufficient documentation

## 2015-07-05 DIAGNOSIS — M25552 Pain in left hip: Secondary | ICD-10-CM

## 2015-07-05 LAB — BASIC METABOLIC PANEL
BUN: 22 mg/dL (ref 6–23)
CO2: 29 mEq/L (ref 19–32)
Calcium: 9.5 mg/dL (ref 8.4–10.5)
Chloride: 104 mEq/L (ref 96–112)
Creatinine, Ser: 0.75 mg/dL (ref 0.40–1.20)
GFR: 84 mL/min (ref >60.00)
Glucose, Bld: 103 mg/dL — ABNORMAL HIGH (ref 70–99)
Potassium: 4.2 mEq/L (ref 3.5–5.1)
Sodium: 139 mEq/L (ref 135–145)

## 2015-07-05 LAB — CBC WITH DIFFERENTIAL/PLATELET
Basophils Absolute: 0 10*3/uL (ref 0.0–0.1)
Basophils Relative: 0.4 % (ref 0.0–3.0)
Eosinophils Absolute: 0.5 10*3/uL (ref 0.0–0.7)
Eosinophils Relative: 4.7 % (ref 0.0–5.0)
HCT: 38.9 % (ref 36.0–46.0)
Hemoglobin: 12.9 g/dL (ref 12.0–15.0)
Lymphocytes Relative: 16.4 % (ref 12.0–46.0)
Lymphs Abs: 1.6 10*3/uL (ref 0.7–4.0)
MCHC: 33.2 g/dL (ref 30.0–36.0)
MCV: 90.4 fl (ref 78.0–100.0)
Monocytes Absolute: 0.5 10*3/uL (ref 0.1–1.0)
Monocytes Relative: 4.8 % (ref 3.0–12.0)
Neutro Abs: 7.3 10*3/uL (ref 1.4–7.7)
Neutrophils Relative %: 73.7 % (ref 43.0–77.0)
Platelets: 262 10*3/uL (ref 150.0–400.0)
RBC: 4.3 Mil/uL (ref 3.87–5.11)
RDW: 13.8 % (ref 11.5–15.5)
WBC: 9.9 10*3/uL (ref 4.0–10.5)

## 2015-07-05 MED ORDER — PREDNISONE 10 MG PO TABS: ORAL_TABLET | ORAL | Status: DC

## 2015-07-05 NOTE — Progress Notes (Signed)
Pre visit review using our clinic review tool, if applicable. No additional management support is needed unless otherwise documented below in the visit note. 

## 2015-07-05 NOTE — Patient Instructions (Signed)
Schedule your complete physical in 6 months We'll notify you of your lab results and make any changes if needed Continue to work on healthy diet and regular exercise- you can do it!! Start the Prednisone as directed.  Take w/ food.  Hold other NSAIDs while on prednisone Wear the wrist brace during the day to prevent pain and provide support If the hip pain doesn't improve, please let me know so we can send you to ortho Call with any questions or concerns Happy Virginia Matthews in there!!!

## 2015-07-05 NOTE — Progress Notes (Signed)
   Subjective:    Patient ID: Virginia Matthews, female    DOB: 09-20-1956, 59 y.o.   MRN: [id number]  HPI HTN- chronic problem, on Lisinopril.  No CP, SOB, HAs, visual changes, edema.  Exercising on treadmill, gym 3x/week, weights, yoga 2x/week.  Leg asymmetry- R leg is bigger, has been 'for awhile'.  No pain or tenderness over calf or thigh.  + varicosities.  Leg will ache at end of day.  L wrist pain- taking Naprosyn 500mg  BID.  Wearing wrist splints nightly w/o relief.  Pain started in May.  No improvement.  Difficulty w/ grip.  Pain along ulnar aspect.  Bilateral hip pain- sxs have been intermittent for some time but have worsened to the point of waking her from sleep.  Pain is lateral- no radiation of pain.  Worse after prolonged sitting.  Extreme stiffness in AM.  On NSAIDs regularly.   Review of Systems For ROS see HPI     Objective:   Physical Exam  Constitutional: She is oriented to person, place, and time. She appears well-developed and well-nourished. No distress.  obese  HENT:  Head: Normocephalic and atraumatic.  Eyes: Conjunctivae and EOM are normal. Pupils are equal, round, and reactive to light.  Neck: Normal range of motion. Neck supple. No thyromegaly present.  Cardiovascular: Normal rate, regular rhythm, normal heart sounds and intact distal pulses.   No murmur heard. Pulmonary/Chest: Effort normal and breath sounds normal. No respiratory distress.  Abdominal: Soft. She exhibits no distension. There is no tenderness.  Musculoskeletal: She exhibits no edema or tenderness (no TTP over either greater trochanteric bursa today but pt indicates that is location of pain).  R leg larger than L leg- no TTP, no redness, no edema  Lymphadenopathy:    She has no cervical adenopathy.  Neurological: She is alert and oriented to person, place, and time.  Skin: Skin is warm and dry.  Psychiatric: She has a normal mood and affect. Her behavior is normal.  Vitals  reviewed.         Assessment & Plan:

## 2015-07-07 NOTE — Assessment & Plan Note (Signed)
New.  Pt's description of pain and the location is consistent w/ trochanteric bursitis despite absence of pain on exam today.  No groin pain.  Pt is taking daily NSAIDs w/o relief.  Based on this, will start pred taper and monitor for improvement.  If none, pt will need ortho referral.  Pt expressed understanding and is in agreement w/ plan.

## 2015-07-07 NOTE — Assessment & Plan Note (Signed)
Chronic problem.  Well controlled.  Asymptomatic.  Check labs.  No anticipated med changes.  Applauded her recent commitment to exercise.  Will continue to follow.

## 2015-07-07 NOTE — Assessment & Plan Note (Signed)
New to provider, ongoing for pt.  Agree w/ pt that this is likely vascular.  No evidence of acute clot.  Refer to vascular for complete evaluation.  Will follow.

## 2015-07-07 NOTE — Assessment & Plan Note (Signed)
New.  Suspect this is an overuse injury from her many hours on the computer.  Encouraged her to wear her wrist brace during the day as well as at night when having pain.  Her short course of prednisone for the hip pain should also improve wrist pain- but if not, will need ortho referral.  Pt expressed understanding and is in agreement w/ plan.

## 2015-07-15 ENCOUNTER — Other Ambulatory Visit: Payer: Self-pay | Admitting: Family Medicine

## 2015-07-15 ENCOUNTER — Encounter: Payer: Self-pay | Admitting: Internal Medicine

## 2015-07-15 NOTE — Telephone Encounter (Signed)
Medication filled to pharmacy as requested.   

## 2015-07-22 ENCOUNTER — Encounter: Payer: Self-pay | Admitting: Family Medicine

## 2015-07-22 ENCOUNTER — Ambulatory Visit (INDEPENDENT_AMBULATORY_CARE_PROVIDER_SITE_OTHER): Payer: 59 | Admitting: Family Medicine

## 2015-07-22 ENCOUNTER — Other Ambulatory Visit: Payer: Self-pay | Admitting: Family Medicine

## 2015-07-22 VITALS — Ht 66.0 in | Wt 277.4 lb

## 2015-07-22 DIAGNOSIS — E669 Obesity, unspecified: Secondary | ICD-10-CM

## 2015-07-22 NOTE — Telephone Encounter (Signed)
Medication filled to pharmacy as requested.   

## 2015-07-22 NOTE — Progress Notes (Signed)
Medical Nutrition Therapy:  Appt start time: 1130 end time:  1230.  Assessment:  Primary concerns today: Weight management.   Bekki has been having hip (and foot) pain, for which she will be seeing Dr. Micheline Chapman at [facility] tomottow.  Her father has been very sick, her son broke his foot, and her husband continues to have health and mobility problems, which has added time demands and stress to her life, and has taken some of the focus off of her behavior changes.  She has considered seeing a therapist to help her deal with current stress.    [place] has continued to exercise consistently, despite time constraints and pain:  Gym workout 3 X wk (20 min TM, ~25 min weights and core, 5 min stretching) and yoga 2 X wk.  Has not as yet tried water exercise, which may be easier on her hip.  She is somewhat frustrated at slowed rate of wt loss, and believes if she could ramp up her efforts some, this would improve.    No 24-hr recall today.  Progress Towards Goal(s):  In progress.   Nutritional Diagnosis:  Continued progress toward goal of [place]-3.3 Overweight/obesity As related to energy balance.  As evidenced by weight loss of nearly 4 lb since appt 11 wks ago.    Intervention:  Nutrition counseling.  Handouts given during visit include:  AVS  Demonstrated degree of understanding via:  Teach Back   Monitoring/Evaluation:  Dietary intake, exercise, and body weight in 7 week(s).

## 2015-07-22 NOTE — Patient Instructions (Addendum)
-   Ok to continue to [employer], but only selectively, i.e., low-kcal-dense foods only on the table.   - Track your hip symptoms along with your workouts.   - Thanksgiving:  - Make sure there are lots of good food options for you to choose.    - Attention to portion sizes.   - Make mindful choices.   - Give away certain leftovers.   - Be sure to continue to be physically active (= exercise + other activity thru the day).  - Do your best to get adequate sleep.   - Suggested therapist:  Everardo Beals.  Goals: 1. Measure portions more consistently! 2. Journal at least 4 X wk.  (Consider bringing journal to work; possible to do at lunch time?)     Decoding Qs, using the lists of feelings and needs.   3. EAT.Q:  Choose a section/Qnaire, and email your thoughts/reaction to this.

## 2015-07-23 ENCOUNTER — Ambulatory Visit (INDEPENDENT_AMBULATORY_CARE_PROVIDER_SITE_OTHER): Payer: 59 | Admitting: Sports Medicine

## 2015-07-23 ENCOUNTER — Encounter: Payer: Self-pay | Admitting: Sports Medicine

## 2015-07-23 DIAGNOSIS — M25571 Pain in right ankle and joints of right foot: Secondary | ICD-10-CM | POA: Diagnosis not present

## 2015-07-23 DIAGNOSIS — M79671 Pain in right foot: Secondary | ICD-10-CM

## 2015-07-23 NOTE — Progress Notes (Signed)
   Subjective:    Patient ID: Virginia Matthews, female    DOB: 09-16-1956, 59 y.o.   MRN: [id number]  HPI chief complaint: Right foot pain, left hip pain  Patient comes in today complaining of 3 months of lateral right foot pain. Pain has actually been present now for longer than this but it has become more noticeable over the past 3 months as she has increased her activity in an effort to lose weight. She denies any recent trauma but did suffer a fracture to this foot last year. It sounds like a possible fifth metatarsal fracture. She was placed into a walking boot for a period of time. She's not sure if her pain was present prior to the injury or not. Pain starts at the base of the fifth metatarsal and will radiate across the dorsum of her foot. She does not notice any significant swelling. She denies ankle pain. Symptoms do improve at rest. No numbness or tingling.  She is also complaining of 2 weeks of lateral left hip pain. No groin pain. Her primary care physician placed her on a steroid taper which has helped with her pain. However, it is starting to slowly return. No radiating pain down the leg. No associated numbness or tingling.    Review of Systems    as above Objective:   Physical Exam  Overweight. No acute distress. Awake alert and oriented 3. Vital signs reviewed  Right foot: No soft tissue swelling. No real tenderness to palpation. Full ankle range of motion. No tenderness at the base of the fifth metatarsal nor at the peroneal tendons. No pain with metatarsal squeeze. Neurovascularly intact distally.  Left hip: Smooth painless hip range of motion. Negative logroll. Diffuse tenderness to palpation along the lateral hip. Neurovascular intact distally.  Patient walks with a supinated gait. No limp.  X-rays of the right foot from a year ago are reviewed. There is an area of ossification just prior to the base of the fifth metatarsal. Although this is read as a possible  fracture I question whether or not this is an accessory bone.      Assessment & Plan:  Right foot pain secondary to supination  Left hip pain secondary to bursitis  I would like to get a follow-up x-ray of her right foot. I'm not sure that she suffered a fracture last year. Instead this may be an accessory bone. I will compare her follow-up x-rays to the films from last year. Regardless of those findings, I think the majority of her symptoms are due to her supination and her increase in activity. I've reassured her that she can continue to exercise but I have added a lateral wedge to a green sports insole and I want her to wear these in her exercise shoes. I've also given her some IT band stretches and hip abductor strengthening exercises for the left hip and I like for her to return to the office in 3-4 weeks for reevaluation. She may benefit at some point from custom orthotics.

## 2015-08-07 ENCOUNTER — Encounter: Payer: Self-pay | Admitting: Internal Medicine

## 2015-08-12 ENCOUNTER — Ambulatory Visit
Admission: RE | Admit: 2015-08-12 | Discharge: 2015-08-12 | Disposition: A | Discharge status: Home / Self Care | Payer: 59 | Source: Ambulatory Visit | Attending: Sports Medicine | Admitting: Sports Medicine

## 2015-08-12 DIAGNOSIS — M79671 Pain in right foot: Secondary | ICD-10-CM

## 2015-08-13 ENCOUNTER — Encounter: Payer: Self-pay | Admitting: Sports Medicine

## 2015-08-13 ENCOUNTER — Ambulatory Visit (INDEPENDENT_AMBULATORY_CARE_PROVIDER_SITE_OTHER): Payer: 59 | Admitting: Sports Medicine

## 2015-08-13 VITALS — BP 143/68 | Ht 66.0 in | Wt 277.0 lb

## 2015-08-13 DIAGNOSIS — M79671 Pain in right foot: Secondary | ICD-10-CM

## 2015-08-13 NOTE — Progress Notes (Signed)
   Subjective:    Patient ID: WYNDI KLEVER, female    DOB: [birth year], 59 y.o.   MRN: [id number]  HPI  59 y/o female presenting for follow-up of 5th MT base avulsion fracture, last seen on 07/23/2015 at prior appointment, she also had hip bursitis which is resolving.  Regarding foot, still having pain at the base of the RIGHT 5th MT interimttently.  No particular or specific activity causes pain, just intermittent pain throughout the day.  Green orthotic made at last visit is providing relief.    Current activity is [employer]GA 2x per week, gym for weights/core 3 days/week with 1 mile walking on the treadmill (19:00 min/mile pace).    No swelling.  No ecchymosis.  No warmth/erythema.  No weakness.  No instability.  PMHx, PSHx, Meds, Allergies and Social Hx reviewed and updated as above.    Review of Systems 10-point ROS was negative, other than HPI above.     Objective:   Physical Exam  General: Well-appearing, obese female in no acute distress Neurologic: Neurovascularly intact, sensation and motor intact in bilateral lower extremities Psych: Appropriate mood and affect, normal thought content Cardiopulmonary: 2+ pulses in distal lower extremities. Nonlabored respirations  Bilateral foot exam: Patient with collapse of bilateral transverse arches with standing. Pes planus well-seated that becomes more pronounced with standing/weightbearing. She has symmetric and appropriate active range of motion in all cardinal movements of the ankle as well as flexion/extension of the toes. She has 5/5 strength without any pain in ankle dorsiflexion, plantarflexion, inversion and eversion. Same for great toe dorsiflexion and plantar flexion. She is nontender to palpation over the base of the fifth. Also nontender to palpation over the distal tibia, fibula and navicular. No pain with calcaneal squeeze. No pain with metatarsal compression.  X-ray: Regarding AP, lateral and oblique foot films  from 08/12/2015, the patient demonstrates remote fracture through the base of the fifth metatarsal. 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 Mild calcaneal spurring appreciated in lateral view.     Assessment & Plan:   #1. Lateral foot pain secondary to excessive supination and remote avulsion fracture through base of fifth metatarsal Patient with only intermittent pain now. Significant response to temporary orthotics. No weakness, swelling or instability. Nontender to palpation on exam and no pain with continued resisted pronation. -Patient use Tylenol and/or NSAIDs for pain relief -Continue to use temporary orthotics with all exercise/weightbearing activity -May gradually increase the intensity and duration of her walking. She was educated on the 10% rule and inform not increase her walking pace or her total times walking by more than 10% every week until returning to goal pace/distance - Patient will return if symptoms worsen or if she would like to transition into custom orthotics.

## 2015-08-15 ENCOUNTER — Other Ambulatory Visit: Payer: Self-pay | Admitting: Family Medicine

## 2015-08-15 NOTE — Telephone Encounter (Signed)
Medication filled to pharmacy as requested.   

## 2015-09-02 ENCOUNTER — Ambulatory Visit (AMBULATORY_SURGERY_CENTER): Payer: Self-pay | Admitting: *Deleted

## 2015-09-02 ENCOUNTER — Ambulatory Visit: Payer: 59 | Admitting: Family Medicine

## 2015-09-02 VITALS — Ht 66.0 in | Wt 285.0 lb

## 2015-09-02 DIAGNOSIS — Z86010 Personal history of colonic polyps: Secondary | ICD-10-CM

## 2015-09-02 NOTE — Progress Notes (Signed)
Patient denies any allergies to eggs or soy. Patient denies any problems with anesthesia/sedation. Patient denies any oxygen use at home and does not take any diet/weight loss medications. Patient declined EMMI information today.  

## 2015-09-04 ENCOUNTER — Encounter: Payer: Self-pay | Admitting: Vascular Surgery

## 2015-09-04 ENCOUNTER — Other Ambulatory Visit: Payer: Self-pay | Admitting: *Deleted

## 2015-09-04 DIAGNOSIS — M7989 Other specified soft tissue disorders: Secondary | ICD-10-CM

## 2015-09-10 ENCOUNTER — Ambulatory Visit (HOSPITAL_COMMUNITY)
Admission: RE | Admit: 2015-09-10 | Discharge: 2015-09-10 | Disposition: A | Discharge status: Home / Self Care | Payer: 59 | Source: Ambulatory Visit | Attending: Vascular Surgery | Admitting: Vascular Surgery

## 2015-09-10 ENCOUNTER — Encounter: Payer: Self-pay | Admitting: Vascular Surgery

## 2015-09-10 ENCOUNTER — Ambulatory Visit (INDEPENDENT_AMBULATORY_CARE_PROVIDER_SITE_OTHER): Payer: 59 | Admitting: Vascular Surgery

## 2015-09-10 VITALS — BP 153/74 | HR 64 | Temp 98.0°F | Resp 16 | Ht 66.0 in | Wt 277.0 lb

## 2015-09-10 DIAGNOSIS — M7989 Other specified soft tissue disorders: Secondary | ICD-10-CM | POA: Insufficient documentation

## 2015-09-10 DIAGNOSIS — I83891 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: Secondary | ICD-10-CM | POA: Insufficient documentation

## 2015-09-10 DIAGNOSIS — I10 Essential (primary) hypertension: Secondary | ICD-10-CM | POA: Insufficient documentation

## 2015-09-10 NOTE — Progress Notes (Signed)
Subjective:     Patient ID: Virginia Matthews, female   DOB: [birth year], 59 y.o.   MRN: [id number]  HPI this 59 year old female evaluated for significant swelling in the right leg with skin rash and bulging varicosities right thigh and calf. This has been present for many years but significantly worsening as time goes on. She has noted significant asymmetry of the size of her calf since she lost 50 pounds. She has no history of DVT thrombophlebitis but did have a small stasis ulcer in the right lateral calf recently which healed. She is unable to wear long leg elastic compression stockings that she has tried. She is having aching throbbing and burning discomfort in the lateral and posterior thigh extending in the lateral calf which seems to be worsening and affects her ability to work because she stands on her feet most of the day.  Past Medical History  Diagnosis Date  . Ruptured lumbar disc     L5/S1  . GERD (gastroesophageal reflux disease)   . Seasonal allergies   . PCO (polycystic ovaries)   . Hypertension   . Obesity   . Endometriosis   . Rosacea   . Psoriasis     Social History  Substance Use Topics  . Smoking status: Never Smoker   . Smokeless tobacco: Never Used  . Alcohol Use: 0.0 oz/week    0 Standard drinks or equivalent per week     Comment: social     Family History  Problem Relation Age of Onset  . Breast cancer Mother   . Cancer Father     prostate cancer, lymphoma  . Heart disease Father   . Hypertension Father   . Cancer Brother     prostate cancer  . Asthma Brother   . Colon cancer Neg Hx     No Known Allergies   Current outpatient prescriptions:  .  aspirin 81 MG tablet, Take 81 mg by mouth daily., Disp: , Rfl:  .  b complex vitamins tablet, Take 1 tablet by mouth daily., Disp: , Rfl:  .  bisacodyl (DULCOLAX) 5 MG EC tablet, Take 5 mg by mouth once. Per prep instructions, Disp: , Rfl:  .  calcipotriene-betamethasone (TACLONEX) ointment, , Disp:  , Rfl: 5 .  Calcitriol (VECTICAL) 3 MCG/GM cream, Apply 1 application topically at bedtime., Disp: , Rfl:  .  Clobetasol Propionate (TEMOVATE EX), Apply topically.  , Disp: , Rfl:  .  escitalopram (LEXAPRO) 10 MG tablet, TAKE 1 TABLET BY MOUTH DAILY., Disp: 90 tablet, Rfl: 1 .  FINACEA 15 % cream, Apply 1 application topically daily., Disp: , Rfl: 2 .  fluticasone (FLONASE) 50 MCG/ACT nasal spray, Place 2 sprays into both nostrils daily., Disp: 16 g, Rfl: 3 .  hydrocortisone valerate (WEST-CORT) 0.2 % ointment, Apply topically. As needed , Disp: , Rfl:  .  hydrocortisone valerate cream (WESTCORT) 0.2 %, , Disp: , Rfl: 3 .  lisinopril (PRINIVIL,ZESTRIL) 10 MG tablet, TAKE 1 TABLET BY MOUTH DAILY., Disp: 90 tablet, Rfl: 1 .  loratadine (CLARITIN) 10 MG tablet, Take 10 mg by mouth daily.  , Disp: , Rfl:  .  Multiple Vitamins-Minerals (CENTRUM SILVER PO), Take by mouth. Use as directed , Disp: , Rfl:  .  naproxen (NAPROSYN) 500 MG tablet, Take 250 mg by mouth as needed. , Disp: , Rfl:  .  NON FORMULARY, Cream for Psoriasis, Disp: , Rfl:  .  Omega-3 Fatty Acids (FISH OIL PO), Take 1 capsule by mouth daily.,  Disp: , Rfl:  .  pantoprazole (PROTONIX) 40 MG tablet, TAKE 1 TABLET BY MOUTH DAILY., Disp: 90 tablet, Rfl: 1 .  polyethylene glycol powder (MIRALAX) powder, Take 1 Container by mouth once. Per prep instructions, Disp: , Rfl:  .  TACLONEX external suspension, , Disp: , Rfl: 5 .  VITAMIN D, CHOLECALCIFEROL, PO, Take 2,000 Units by mouth daily. , Disp: , Rfl:   Filed Vitals:   09/10/15 0937  BP: 153/74  Pulse: 64  Temp: 98 F (36.7 C)  Resp: 16  Height: 5\' 6"  (1.676 m)  Weight: 277 lb (125.646 kg)  SpO2: 97%    Body mass index is 44.73 kg/(m^2).           Review of Systems denies chest pain, dyspnea on exertion, PND, orthopnea, hemoptysis, claudication. See history of present illness     Objective:   Physical Exam BP 153/74 mmHg  Pulse 64  Temp(Src) 98 F (36.7 C)  Resp  16  Ht 5\' 6"  (1.676 m)  Wt 277 lb (125.646 kg)  BMI 44.73 kg/m2  SpO2 97%  /Gen.-alert and oriented x3 in no apparent distress-obese HEENT normal for age Lungs no rhonchi or wheezing Cardiovascular regular rhythm no murmurs carotid pulses 3+ palpable no bruits audible Abdomen soft nontender no palpable masses-obese  Musculoskeletal free of  major deformities Skin clear -no rashes Neurologic normal Lower extremities 3+ femoral and dorsalis pedis pulses palpable bilaterally with no edema 1+ to 2+ edema on the right no edema on the left. Right calf is 6 cm larger in circumference compared to the left. Bulging varicosities posterior distal thigh extending into the lateral calf with distribution of reticular veins.  Today I ordered venous duplex exam the right leg which I reviewed and interpreted. It is no DVT. There is no deep vein reflux. There is a very large right great saphenous vein that the only reflux noted is in the mid thigh. The small saphenous vein is also large in caliber and connects with the mid thigh great saphenous vein.       Assessment:     Painful varicosities right leg with segmental reflux right great saphenous vein and large caliber right great saphenous vein. Symptoms are affecting patient's daily living.    Plan:         #1 long leg elastic compression stockings 20-30 mm gradient #2 elevate legs as much as possible #3 ibuprofen daily on a regular basis for pain #4 return in 3 months-if no significant improvement then she should have multiple stab phlebectomy-greater than 20 of painful varicosities in the posterior thigh and calf and one course of sclerotherapy. Return in 3 months

## 2015-09-12 ENCOUNTER — Ambulatory Visit (AMBULATORY_SURGERY_CENTER): Payer: 59 | Admitting: Internal Medicine

## 2015-09-12 ENCOUNTER — Encounter: Payer: Self-pay | Admitting: Internal Medicine

## 2015-09-12 VITALS — BP 136/90 | HR 52 | Temp 97.8°F | Resp 18 | Ht 66.0 in | Wt 285.0 lb

## 2015-09-12 DIAGNOSIS — K635 Polyp of colon: Secondary | ICD-10-CM

## 2015-09-12 DIAGNOSIS — D120 Benign neoplasm of cecum: Secondary | ICD-10-CM | POA: Diagnosis not present

## 2015-09-12 DIAGNOSIS — Z86010 Personal history of colonic polyps: Secondary | ICD-10-CM | POA: Diagnosis not present

## 2015-09-12 DIAGNOSIS — D123 Benign neoplasm of transverse colon: Secondary | ICD-10-CM | POA: Diagnosis not present

## 2015-09-12 HISTORY — PX: COLONOSCOPY WITH PROPOFOL: SHX5780

## 2015-09-12 MED ORDER — SODIUM CHLORIDE 0.9 % IV SOLN: 500.0000 mL | INTRAVENOUS | Status: DC

## 2015-09-12 NOTE — Op Note (Signed)
[facility]  [address] [place], [address]   COLONOSCOPY PROCEDURE REPORT  PATIENT: Virginia Matthews, Virginia Matthews  MR#: [id number] BIRTHDATE: September 14, 1956 , 18  yrs. old GENDER: female ENDOSCOPIST: Eustace Quail, MD REFERRED [id number] Program Recall PROCEDURE DATE:  09/12/2015 PROCEDURE:   Colonoscopy, surveillance and Colonoscopy with snare polypectomy x 2 First Screening Colonoscopy - Avg.  risk and is 50 yrs.  old or older - No.  Prior Negative Screening - Now for repeat screening. N/A  History of Adenoma - Now for follow-up colonoscopy & has been > or = to 3 yrs.  Yes hx of adenoma.  Has been 3 or more years since last colonoscopy.  Polyps removed today? Yes ASA CLASS:   Class II INDICATIONS:Surveillance due to prior colonic neoplasia and PH Colon Adenoma. Index examination September 2010 (small TA). MEDICATIONS: Monitored anesthesia care and Propofol 300 mg IV  DESCRIPTION OF PROCEDURE:   After the risks benefits and alternatives of the procedure were thoroughly explained, informed consent was obtained.  The digital rectal exam revealed no abnormalities of the rectum.   The LB [id number]  endoscope was introduced through the anus and advanced to the cecum, which was identified by both the appendix and ileocecal valve. No adverse events experienced.   The quality of the prep was excellent. (Suprep was used)  The instrument was then slowly withdrawn as the colon was fully examined. Estimated blood loss is zero unless otherwise noted in this procedure report.  COLON FINDINGS: Two polyps ranging between 3-3mm in size were found at the cecum and in the transverse colon.  A polypectomy was performed with a cold snare.  The resection was complete, the polyp tissue was completely retrieved and sent to histology.   There was moderate diverticulosis noted in the transverse colon and left colon.   The examination was otherwise normal.  Retroflexed  views revealed internal hemorrhoids. The time to cecum = 4.0 Withdrawal time = 12.1   The scope was withdrawn and the procedure completed. COMPLICATIONS: There were no immediate complications.  ENDOSCOPIC IMPRESSION: 1.   Two polyps were found at the cecum and in the transverse colon; polypectomy was performed with a cold snare 2.   Moderate diverticulosis was noted in the transverse and left colon 3.   The examination was otherwise normal  RECOMMENDATIONS: 1. Repeat colonoscopy in 5 years if polyp adenomatous; otherwise 10 years  eSigned:  Eustace Quail, MD 09/12/2015 2:24 PM   cc: The Patient and Midge Minium, MD

## 2015-09-12 NOTE — Progress Notes (Signed)
Report to PACU, RN, vss, BBS= Clear.  

## 2015-09-12 NOTE — Patient Instructions (Signed)
Impressions/recommendations:  Polyps (handout given) Diverticulosis (handout given) High Fiber diet (handout given)  Repeat colonoscopy pending pathology results.  YOU HAD AN ENDOSCOPIC PROCEDURE TODAY AT [facility]:   Refer to the procedure report that was given to you for any specific questions about what was found during the examination.  If the procedure report does not answer your questions, please call your gastroenterologist to clarify.  If you requested that your care partner not be given the details of your procedure findings, then the procedure report has been included in a sealed envelope for you to review at your convenience later.  YOU SHOULD EXPECT: Some feelings of bloating in the abdomen. Passage of more gas than usual.  Walking can help get rid of the air that was put into your GI tract during the procedure and reduce the bloating. If you had a lower endoscopy (such as a colonoscopy or flexible sigmoidoscopy) you may notice spotting of blood in your stool or on the toilet paper. If you underwent a bowel prep for your procedure, you may not have a normal bowel movement for a few days.  Please Note:  You might notice some irritation and congestion in your nose or some drainage.  This is from the oxygen used during your procedure.  There is no need for concern and it should clear up in a day or so.  SYMPTOMS TO REPORT IMMEDIATELY:   Following lower endoscopy (colonoscopy or flexible sigmoidoscopy):  Excessive amounts of blood in the stool  Significant tenderness or worsening of abdominal pains  Swelling of the abdomen that is new, acute  Fever of 100F or higher   Following upper endoscopy (EGD)  Vomiting of blood or coffee ground material  New chest pain or pain under the shoulder blades  Painful or persistently difficult swallowing  New shortness of breath  Fever of 100F or higher  Black, tarry-looking stools  For urgent or emergent issues, a  gastroenterologist can be reached at any hour by calling [phone].   DIET: Your first meal following the procedure should be a small meal and then it is ok to progress to your normal diet. Heavy or fried foods are harder to digest and may make you feel nauseous or bloated.  Likewise, meals heavy in dairy and vegetables can increase bloating.  Drink plenty of fluids but you should avoid alcoholic beverages for 24 hours.  ACTIVITY:  You should plan to take it easy for the rest of today and you should NOT DRIVE or use heavy machinery until tomorrow (because of the sedation medicines used during the test).    FOLLOW UP: Our staff will call the number listed on your records the next business day following your procedure to check on you and address any questions or concerns that you may have regarding the information given to you following your procedure. If we do not reach you, we will leave a message.  However, if you are feeling well and you are not experiencing any problems, there is no need to return our call.  We will assume that you have returned to your regular daily activities without incident.  If any biopsies were taken you will be contacted by phone or by letter within the next 1-3 weeks.  Please call us at [phone] if you have not heard about the biopsies in 3 weeks.    SIGNATURES/CONFIDENTIALITY: You and/or your care partner have signed paperwork which will be entered into your electronic medical record.  These signatures attest to the fact that that the information above on your After Visit Summary has been reviewed and is understood.  Full responsibility of the confidentiality of this discharge information lies with you and/or your care-partner.

## 2015-09-12 NOTE — Progress Notes (Signed)
Called to room to assist during endoscopic procedure.  Patient ID and intended procedure confirmed with present staff. Received instructions for my participation in the procedure from the performing physician.  

## 2015-09-13 ENCOUNTER — Telehealth: Payer: Self-pay | Admitting: *Deleted

## 2015-09-13 NOTE — Telephone Encounter (Signed)
  Follow up Call-  Call back number 09/12/2015  Post procedure Call Back phone  # [phone]  Permission to leave phone message Yes     Patient questions:  Do you have a fever, pain , or abdominal swelling? No. Pain Score  0 *  Have you tolerated food without any problems? Yes.    Have you been able to return to your normal activities? Yes.    Do you have any questions about your discharge instructions: Diet   No. Medications  No. Follow up visit  No.  Do you have questions or concerns about your Care? No.  Actions: * If pain score is 4 or above: No action needed, pain <4.

## 2015-09-17 ENCOUNTER — Encounter: Payer: Self-pay | Admitting: Internal Medicine

## 2015-09-18 ENCOUNTER — Other Ambulatory Visit: Payer: Self-pay | Admitting: General Practice

## 2015-09-18 MED ORDER — FLUTICASONE PROPIONATE 50 MCG/ACT NA SUSP: 2.0000 | Freq: Every day | NASAL | Status: AC

## 2015-10-01 DIAGNOSIS — L400 Psoriasis vulgaris: Secondary | ICD-10-CM | POA: Diagnosis not present

## 2015-10-03 ENCOUNTER — Ambulatory Visit: Payer: 59 | Admitting: Family Medicine

## 2015-10-03 DIAGNOSIS — L400 Psoriasis vulgaris: Secondary | ICD-10-CM | POA: Diagnosis not present

## 2015-10-10 DIAGNOSIS — L400 Psoriasis vulgaris: Secondary | ICD-10-CM | POA: Diagnosis not present

## 2015-10-16 DIAGNOSIS — M7071 Other bursitis of hip, right hip: Secondary | ICD-10-CM | POA: Diagnosis not present

## 2015-10-16 DIAGNOSIS — R768 Other specified abnormal immunological findings in serum: Secondary | ICD-10-CM | POA: Diagnosis not present

## 2015-10-16 DIAGNOSIS — L409 Psoriasis, unspecified: Secondary | ICD-10-CM | POA: Diagnosis not present

## 2015-10-16 DIAGNOSIS — L4050 Arthropathic psoriasis, unspecified: Secondary | ICD-10-CM | POA: Diagnosis not present

## 2015-10-16 DIAGNOSIS — M7072 Other bursitis of hip, left hip: Secondary | ICD-10-CM | POA: Diagnosis not present

## 2015-10-16 DIAGNOSIS — M170 Bilateral primary osteoarthritis of knee: Secondary | ICD-10-CM | POA: Diagnosis not present

## 2015-10-17 ENCOUNTER — Encounter: Payer: Self-pay | Admitting: Family Medicine

## 2015-10-17 ENCOUNTER — Ambulatory Visit (INDEPENDENT_AMBULATORY_CARE_PROVIDER_SITE_OTHER): Payer: 59 | Admitting: Family Medicine

## 2015-10-17 NOTE — Progress Notes (Signed)
Medical Nutrition Therapy:  Appt start time: 1130 end time:  1230.  Assessment:  Primary concerns today: Weight management.   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  She is following up with Dr. Trudie Reed for this.  It has been difficult for exercising, and she has done some stress eating.  She is ready to get back on track, however, and has agreed to sign up for the Weigh to Wellness class that starts Jan 30.    We reviewed the Urge911 card, a tool for addressing emotional eating (or restriction), and Elsbeth felt this may be a useful approach for her to try.    Progress Towards Goal(s):  In progress.   Nutritional Diagnosis:  Continued progress toward goal of [place]-3.3 Overweight/obesity As related to energy balance.  As evidenced by weight loss of nearly 4 lb since appt 11 wks ago.    Intervention:  Nutrition counseling.  Handouts given during visit include:  AVS  Demonstrated degree of understanding via:  Teach Back   Monitoring/Evaluation:  Dietary intake, exercise, and body weight in 8 week(s).

## 2015-10-17 NOTE — Patient Instructions (Signed)
-   Consider taking the Weigh to Wellness class that starts January 30th.  - Use the Urge911 Card as appropriate, and write some about your experience with it.    - Email Jeannie no later Sat, Jan 28th with your thoughts.    - Bring your walking shoes and all you need in [place] next week.   - Bring your journal.    Food Goals: 1. Measure portions for meals and snacks.    - Check mark on kitchen calendar for each meal where you successfully measured portions.   2. Clean out the kitchen; give away or throw away the foods that are not supportive of your efforts.

## 2015-10-18 DIAGNOSIS — L400 Psoriasis vulgaris: Secondary | ICD-10-CM | POA: Diagnosis not present

## 2015-10-31 DIAGNOSIS — L400 Psoriasis vulgaris: Secondary | ICD-10-CM | POA: Diagnosis not present

## 2015-11-05 DIAGNOSIS — L400 Psoriasis vulgaris: Secondary | ICD-10-CM | POA: Diagnosis not present

## 2015-11-12 DIAGNOSIS — L400 Psoriasis vulgaris: Secondary | ICD-10-CM | POA: Diagnosis not present

## 2015-11-14 DIAGNOSIS — L400 Psoriasis vulgaris: Secondary | ICD-10-CM | POA: Diagnosis not present

## 2015-11-19 DIAGNOSIS — L400 Psoriasis vulgaris: Secondary | ICD-10-CM | POA: Diagnosis not present

## 2015-11-21 DIAGNOSIS — L400 Psoriasis vulgaris: Secondary | ICD-10-CM | POA: Diagnosis not present

## 2015-11-26 DIAGNOSIS — L400 Psoriasis vulgaris: Secondary | ICD-10-CM | POA: Diagnosis not present

## 2015-11-28 DIAGNOSIS — L400 Psoriasis vulgaris: Secondary | ICD-10-CM | POA: Diagnosis not present

## 2015-12-05 ENCOUNTER — Ambulatory Visit (INDEPENDENT_AMBULATORY_CARE_PROVIDER_SITE_OTHER): Payer: 59 | Admitting: Family Medicine

## 2015-12-05 ENCOUNTER — Encounter: Payer: Self-pay | Admitting: Family Medicine

## 2015-12-05 VITALS — Ht 66.0 in | Wt 276.9 lb

## 2015-12-05 DIAGNOSIS — Z6841 Body Mass Index (BMI) 40.0 and over, adult: Secondary | ICD-10-CM

## 2015-12-05 DIAGNOSIS — Z713 Dietary counseling and surveillance: Secondary | ICD-10-CM | POA: Diagnosis not present

## 2015-12-05 DIAGNOSIS — E669 Obesity, unspecified: Secondary | ICD-10-CM | POA: Diagnosis not present

## 2015-12-05 DIAGNOSIS — L400 Psoriasis vulgaris: Secondary | ICD-10-CM | POA: Diagnosis not present

## 2015-12-05 NOTE — Patient Instructions (Addendum)
Goals: 1. Measure consistently, especially condiments or anything with fat.    - Salad dressing, including mayo (if using in a mixture, use part plain [place] yogurt)  - Cream  - Carb's like potatoes, rice, pasta  - Meat 2. Physical activity:  Gym 2 X wk (20-min walk, weights 20 min, core work and stretch).   - Continue yoga 1 X wk.   - Walk at least once on weekend.    - Bonus: Walk 10-20 min mid-day as time allows.    Monitor progress using your Goals Sheet, and bring to follow-up, April 17 at 10 AM.

## 2015-12-05 NOTE — Progress Notes (Signed)
Medical Nutrition Therapy:  Appt start time: 1130 end time:  1230.  Assessment:  Primary concerns today: Weight management.   Virginia Matthews has started a new anti-inflammatory medication for her scoriatic arthritis (could not remember the name, so will bring next time).  This medication has not helped much, so exercise has been minimal.  She does think she can get back to exercising, however, if she starts going later in the day instead of early morning.    Virginia Matthews's goals last appt were to measure her foods more, and to clean out the kitchen, and she has been mostly successful in these, although she feels she could still improve on measuring more consistently.    She used the Urge911 4-step process on a few occasions, but fortunately has not really had too many incidents in which she was battling food urges.    24-hr recall:  (Up at 6:30 AM) B (7:45 AM)-  1 ww Eng muffin, 1 egg, coffee, 2 tbsp cream Snk (11 AM)-  3 [employer] Thin Addictives (135 kcal)  L (12:30 PM)-  6 oz Grk yogurt, 1 mixed berries, 1/4 c granola, water Snk ( PM)-  --- D (6:30 PM)-  1 c rice, 2 c mixed stir-fried veg's, 5-6 oz pork loin, water Snk ( PM)-  --- Typical day? Yes.    Progress Towards Goal(s):  In progress.   Nutritional Diagnosis:  Continued progress toward goal of [place]-3.3 Overweight/obesity As related to energy balance.  As evidenced by weight loss of 8.5 lb in 7 weeks.    Intervention:  Nutrition counseling.  Handouts given during visit include:  AVS  Demonstrated degree of understanding via:  Teach Back   Monitoring/Evaluation:  Dietary intake, exercise, and body weight in 6 week(s).

## 2015-12-10 ENCOUNTER — Ambulatory Visit: Payer: 59 | Admitting: Vascular Surgery

## 2015-12-10 DIAGNOSIS — L400 Psoriasis vulgaris: Secondary | ICD-10-CM | POA: Diagnosis not present

## 2015-12-11 DIAGNOSIS — L409 Psoriasis, unspecified: Secondary | ICD-10-CM | POA: Diagnosis not present

## 2015-12-11 DIAGNOSIS — L4050 Arthropathic psoriasis, unspecified: Secondary | ICD-10-CM | POA: Diagnosis not present

## 2015-12-11 DIAGNOSIS — M170 Bilateral primary osteoarthritis of knee: Secondary | ICD-10-CM | POA: Diagnosis not present

## 2015-12-11 DIAGNOSIS — R768 Other specified abnormal immunological findings in serum: Secondary | ICD-10-CM | POA: Diagnosis not present

## 2015-12-17 DIAGNOSIS — L400 Psoriasis vulgaris: Secondary | ICD-10-CM | POA: Diagnosis not present

## 2015-12-19 DIAGNOSIS — L400 Psoriasis vulgaris: Secondary | ICD-10-CM | POA: Diagnosis not present

## 2015-12-23 DIAGNOSIS — L400 Psoriasis vulgaris: Secondary | ICD-10-CM | POA: Diagnosis not present

## 2015-12-24 ENCOUNTER — Encounter: Payer: Self-pay | Admitting: Family Medicine

## 2015-12-30 NOTE — Progress Notes (Signed)
Cardiology Office Note   Date:  12/31/2015   ID:  Virginia Matthews, DOB [birth year], MRN [id number]  PCP:  Annye Asa, MD  Cardiologist:   Sharol Harness, MD   Chief Complaint  Patient presents with  . New Evaluation    chest pain, strong fm hx of cad      History of Present Illness: Virginia Matthews is a 60 y.o. female with hypertension and obesitywho presents for an evaluation of cardiac risk.  Virginia Matthews reports several episodes of chest discomfort. They typically occur in the setting of stressful situations.  Several weeks ago she had an episode of intense chest discomfort prior to presenting at work. There is no associated shortness of breath, nausea, vomiting, diaphoresis, or palpitations. The episode lasted for approximately 10 minutes before it subsided. It felt like indigestion. There is no radiation. It did not change with exertion. She's had several less intense episodes since that time. She exercises regularly and never has chest discomfort with exertion. Currently she exercises with yoga and walking. 6 months ago she was going to the gym, lifting weights, doing yoga, and walking most days of the week. She was diagnosed with psoriatic arthritis and was limited due to joint pain. However, she recently started Humira and her symptoms are much improved.  She lost 55 pounds in the last year with diet and exercise. She eats a healthy diet but struggles with portions.  Virginia Matthews also wanted her cardiovascular risk assessed because her father had coronary artery bypass grafting at age 101 and her brother recently had a PCI at age 4.  She has never smoked. She reports mild LE edema by the end of the day but has not noted any orthopnea, or PND.  She did have an episode of transient word finding difficulty last year that lasted for a few minutes.  She did not have any work up at the time.   Past Medical History  Diagnosis Date  . Ruptured lumbar  disc     L5/S1  . GERD (gastroesophageal reflux disease)   . Seasonal allergies   . PCO (polycystic ovaries)   . Hypertension   . Obesity   . Endometriosis   . Rosacea   . Psoriasis   . Carotid bruit 12/31/2015    Past Surgical History  Procedure Laterality Date  . Laparoscopic endometriosis fulguration    . Mole removal      on hip  . Pelvic laparoscopy      DIAG LAP  . Microdiscectomy lumbar  10/12     Current Outpatient Prescriptions  Medication Sig Dispense Refill  . aspirin 81 MG tablet Take 81 mg by mouth daily.    Marland Kitchen b complex vitamins tablet Take 1 tablet by mouth daily.    . calcipotriene-betamethasone (TACLONEX) ointment Apply 1 application topically daily.   5  . Calcitriol (VECTICAL) 3 MCG/GM cream Apply 1 application topically at bedtime.    . Clobetasol Propionate (TEMOVATE EX) Apply 1 application topically daily.     . diclofenac (VOLTAREN) 75 MG EC tablet Take 75 mg by mouth daily.  3  . escitalopram (LEXAPRO) 10 MG tablet TAKE 1 TABLET BY MOUTH DAILY. 90 tablet 1  . FINACEA 15 % cream Apply 1 application topically daily.  2  . fluticasone (FLONASE) 50 MCG/ACT nasal spray Place 2 sprays into both nostrils daily. 16 g 3  . HUMIRA PEN 40 MG/0.8ML PNKT Inject 40 mg into the skin every 14 (fourteen)  days.  6  . hydrocortisone valerate (WEST-CORT) 0.2 % ointment Apply topically. As needed     . lisinopril (PRINIVIL,ZESTRIL) 10 MG tablet TAKE 1 TABLET BY MOUTH DAILY. 90 tablet 1  . loratadine (CLARITIN) 10 MG tablet Take 10 mg by mouth daily.      . Multiple Vitamins-Minerals (CENTRUM SILVER PO) Take by mouth. Use as directed     . Omega-3 Fatty Acids (FISH OIL PO) Take 1 capsule by mouth daily.    . pantoprazole (PROTONIX) 40 MG tablet TAKE 1 TABLET BY MOUTH DAILY. 90 tablet 1  . TACLONEX external suspension Apply 1 application topically as needed.   5  . Turmeric 500 MG TABS Take 500 mg by mouth 2 (two) times daily.    Marland Kitchen VITAMIN D, CHOLECALCIFEROL, PO Take 2,000  Units by mouth daily.      No current facility-administered medications for this visit.    Allergies:   Review of patient's allergies indicates no known allergies.    Social History:  The patient  reports that she has never smoked. She has never used smokeless tobacco. She reports that she drinks alcohol. She reports that she does not use illicit drugs.   Family History:  The patient's family history includes Asthma in her brother; Breast cancer in her mother; Cancer in her brother and father; Heart disease in her father; Hypertension in her father. There is no history of Colon cancer.    ROS:  Please see the history of present illness.   Otherwise, review of systems are positive for none.   All other systems are reviewed and negative.    PHYSICAL EXAM: VS:  BP 126/80 mmHg  Pulse 61  Ht 5\' 6"  (1.676 m)  Wt 125.737 kg (277 lb 3.2 oz)  BMI 44.76 kg/m2 , BMI Body mass index is 44.76 kg/(m^2). GENERAL:  Well appearing HEENT:  Pupils equal round and reactive, fundi not visualized, oral mucosa unremarkable NECK:  No jugular venous distention, waveform within normal limits, carotid upstroke brisk and symmetric, mild R carotid bruits, no thyromegaly LYMPHATICS:  No cervical adenopathy LUNGS:  Clear to auscultation bilaterally HEART:  RRR.  PMI not displaced or sustained,S1 and S2 within normal limits, no S3, no S4, no clicks, no rubs, I/VI murmurs ABD:  Flat, positive bowel sounds normal in frequency in pitch, no bruits, no rebound, no guarding, no midline pulsatile mass, no hepatomegaly, no splenomegaly EXT:  2 plus pulses throughout, no edema, no cyanosis no clubbing SKIN:  No rashes no nodules NEURO:  Cranial nerves II through XII grossly intact, motor grossly intact throughout PSYCH:  Cognitively intact, oriented to person, place and time   EKG:  EKG is ordered today. The ekg ordered today demonstrates sinus rhythm. Rate 61 bpm.   Recent Labs: 01/03/2015: ALT 23; TSH  2.28 07/05/2015: BUN 22; Creatinine, Ser 0.75; Hemoglobin 12.9; Platelets 262.0; Potassium 4.2; Sodium 139    Lipid Panel    Component Value Date/Time   CHOL 144 01/03/2015 0838   TRIG 63.0 01/03/2015 0838   HDL 45.80 01/03/2015 0838   CHOLHDL 3 01/03/2015 0838   VLDL 12.6 01/03/2015 0838   LDLCALC 86 01/03/2015 0838      Wt Readings from Last 3 Encounters:  12/31/15 125.737 kg (277 lb 3.2 oz)  12/05/15 125.601 kg (276 lb 14.4 oz)  10/17/15 129.411 kg (285 lb 4.8 oz)      ASSESSMENT AND PLAN:  # Atypical chest pain: Symptoms seem to be more anxiety related than  due to obstructive coronary disease.  However, she does have a family history of premature CAD.  We will obtain a cardiac CT-A with coronary calcium scoring to both assess her current symptoms as well as to determine if she would benefit from more aggressive prevention such as statins.  Continue aspirin.  We will obtain a BMP prior to cardiac CT-A.  # Hypertension: BP well-controlled.  Continue lisinopril.  # Obesity: Virginia Matthews is already doing many wonderful things in order to lose weight and she has been successful. She was congratulated on her efforts and encouraged to continue.    # Carotid bruit: We will obtain a carotid ultrasound.   Current medicines are reviewed at length with the patient today.  The patient does not have concerns regarding medicines.  The following changes have been made:  no change  Labs/ tests ordered today include:   Orders Placed This Encounter  Procedures  . CT Coronary Morp W/Cta Cor W/Score W/Ca W/Cm &/Or Wo/Cm  . Basic Metabolic Panel (BMET)  . EKG 12-Lead     Disposition:   FU with Kaelani Kendrick C. Oval Linsey, MD, [facility] in 1 year   This note was written with the assistance of speech recognition software.  Please excuse any transcriptional errors.  Signed, Atreyu Mak C. Oval Linsey, MD, [facility]  12/31/2015 8:51 AM    [facility]

## 2015-12-31 ENCOUNTER — Encounter: Payer: Self-pay | Admitting: Cardiovascular Disease

## 2015-12-31 ENCOUNTER — Ambulatory Visit (INDEPENDENT_AMBULATORY_CARE_PROVIDER_SITE_OTHER): Payer: 59 | Admitting: Cardiovascular Disease

## 2015-12-31 VITALS — BP 126/80 | HR 61 | Ht 66.0 in | Wt 277.2 lb

## 2015-12-31 DIAGNOSIS — E669 Obesity, unspecified: Secondary | ICD-10-CM | POA: Diagnosis not present

## 2015-12-31 DIAGNOSIS — R0989 Other specified symptoms and signs involving the circulatory and respiratory systems: Secondary | ICD-10-CM

## 2015-12-31 DIAGNOSIS — I10 Essential (primary) hypertension: Secondary | ICD-10-CM | POA: Diagnosis not present

## 2015-12-31 DIAGNOSIS — R079 Chest pain, unspecified: Secondary | ICD-10-CM

## 2015-12-31 LAB — BASIC METABOLIC PANEL
BUN: 23 mg/dL (ref 7–25)
CO2: 24 mmol/L (ref 20–31)
Calcium: 9.2 mg/dL (ref 8.6–10.4)
Chloride: 104 mmol/L (ref 98–110)
Creat: 0.77 mg/dL (ref 0.50–1.05)
Glucose, Bld: 94 mg/dL (ref 65–99)
Potassium: 4.8 mmol/L (ref 3.5–5.3)
Sodium: 139 mmol/L (ref 135–146)

## 2015-12-31 NOTE — Patient Instructions (Addendum)
Medication Instructions:  Your physician recommends that you continue on your current medications as directed. Please refer to the Current Medication list given to you today.   Labwork: LABWORK AT [facility] ON THE FIRST FLOOR. The lab can be found on the FIRST FLOOR of out building in [address]    Testing/Procedures: Your physician has requested that you have cardiac CT. Cardiac computed tomography (CT) is a painless test that uses an x-ray machine to take clear, detailed pictures of your heart. For further information please visit [email or web address]. Please follow instruction sheet as given.  Your physician has requested that you have a carotid duplex. This test is an ultrasound of the carotid arteries in your neck. It looks at blood flow through these arteries that supply the brain with blood. Allow one hour for this exam. There are no restrictions or special instructions.    Follow-Up: Your physician wants you to follow-up in: [place]. You will receive a reminder letter in the mail two months in advance. If you don't receive a letter, please call our office to schedule the follow-up appointment.   Any Other Special Instructions Will Be Listed Below (If Applicable).  Cardiac CT Angiogram A cardiac CT angiogram is a test to help your health care provider find out why you are having chest pains or other symptoms of heart disease. The test uses an advanced type of X-ray machine that scans your heart and the area around the heart and creates multiple pictures of it. Other names for the test are coronary CT angiography, coronary artery scanning, and CTA.  The test is painless and fairly quick. It is noninvasive. That means it does not involve any type of surgery or cuts (incisions). Instead, a fluid called contrast dye is injected into an IV tube in your arm. The contrast dye acts as a highlighter as it flows through the veins. With the CT scan, it lets your health care  provider see:   If the coronary arteries in your heart are more narrow than they should be, or if they are blocked.  If there is fluid around the heart.  If the muscles and tissues of the heart look weak or show signs of disease.  If the lungs contain any blood clots. LET [facility] CARE PROVIDER KNOW ABOUT:  Any allergies you have.   All medicines you are taking, including vitamins, herbs, eye drops, creams, and over-the-counter medicines.  Previous problems you or members of your family have had with the use of anesthetics.  Any blood disorders you have.  Previous surgeries you have had.  Medical conditions you have. RISKS AND COMPLICATIONS Generally, this is a safe procedure. However, as with any procedure, problems can occur. Possible problems include:   Allergic reaction to the contrast dye. This can range from mild to severe and may include:   Itching at the IV tube insertion site.   Redness at the IV tube insertion site.   Hives.   Nausea.   Difficulty breathing.   Kidney failure.   Problems from radiation exposure. This test involves the use of radiation. Radiation exposure can be dangerous to a pregnant patient and fetus. If you are pregnant, shields are used to protect your belly and pelvic area. More details are available from your health care provider. BEFORE THE PROCEDURE  The day before the test:    Stop drinking caffeinated beverages. These include energy drinks, tea, soda, coffee, and hot chocolate.  Stop taking medicines to  treat erectile dysfunction. They can interfere with medicines you may be given during the procedure. Check with your health care provider if you should stop taking any other medicines. On the day of the test:   About 4 hours before the test, stop eating and drinking anything but water as advised by your health care provider.  Avoid wearing jewelry. You will have to undress from the waist up and wear a hospital  gown. PROCEDURE  The hair on your chest may need to be shaved. This is done because small sticky patches called electrodes are put on your chest. These transmit information that helps monitor your heart during the test.  You might be given heart medicine during the test. This is done to control your heart rate during the test so a good image is obtained.  An IV tube will be inserted in your arm.  You will be asked to lie on a table with your arms above your head.  The contrast dye will be injected into the IV tube. You might feel warm or you may get a metallic taste in your mouth.  The table you are lying on will move into a large machine that will do the scanning.  You will be able to see, hear, and talk to the person running the machine while you are in it. Follow that person's directions. You may be asked to hold your breath for 2-3 seconds as pictures are taken.  The CT machine will move around you to take pictures. Do not move while it is scanning. This helps to get a good image of your heart.  When the best possible pictures have been taken, the machine will be turned off. The table will move out of the machine. The IV tube will then be removed. AFTER THE PROCEDURE  You will be allowed to get dressed and return to your normal activities.  Results will be interpreted by the health care provider and the results will be discussed with you.   This information is not intended to replace advice given to you by your health care provider. Make sure you discuss any questions you have with your health care provider.   Document Released: 08/27/2008 Document Revised: 10/05/2014 Document Reviewed: 05/31/2013 Elsevier Interactive Patient Education [employer].

## 2016-01-01 ENCOUNTER — Encounter: Payer: Self-pay | Admitting: Cardiovascular Disease

## 2016-01-01 DIAGNOSIS — L400 Psoriasis vulgaris: Secondary | ICD-10-CM | POA: Diagnosis not present

## 2016-01-01 DIAGNOSIS — L718 Other rosacea: Secondary | ICD-10-CM | POA: Diagnosis not present

## 2016-01-07 ENCOUNTER — Encounter: Payer: 59 | Admitting: Family Medicine

## 2016-01-08 ENCOUNTER — Telehealth: Payer: Self-pay | Admitting: Cardiovascular Disease

## 2016-01-08 ENCOUNTER — Inpatient Hospital Stay (HOSPITAL_COMMUNITY): Admission: RE | Admit: 2016-01-08 | Payer: 59 | Source: Ambulatory Visit

## 2016-01-08 NOTE — Telephone Encounter (Signed)
New message   Pt is calling to speak to rn   She states she is returning her call Rip Harbour)

## 2016-01-08 NOTE — Telephone Encounter (Signed)
Called pt back and gave her the results of recent lab work. Pt verbalized understanding.

## 2016-01-09 ENCOUNTER — Other Ambulatory Visit: Payer: Self-pay | Admitting: Family Medicine

## 2016-01-09 NOTE — Telephone Encounter (Signed)
90 day supply sent to pharmacy. Pt is due for fasting cpe. Please call pt to schedule appt. Thanks!

## 2016-01-10 ENCOUNTER — Ambulatory Visit (HOSPITAL_COMMUNITY)
Admission: RE | Admit: 2016-01-10 | Discharge: 2016-01-10 | Disposition: A | Discharge status: Home / Self Care | Payer: 59 | Source: Ambulatory Visit | Attending: Cardiovascular Disease | Admitting: Cardiovascular Disease

## 2016-01-10 DIAGNOSIS — K219 Gastro-esophageal reflux disease without esophagitis: Secondary | ICD-10-CM | POA: Diagnosis not present

## 2016-01-10 DIAGNOSIS — I10 Essential (primary) hypertension: Secondary | ICD-10-CM | POA: Diagnosis not present

## 2016-01-10 DIAGNOSIS — R0989 Other specified symptoms and signs involving the circulatory and respiratory systems: Secondary | ICD-10-CM | POA: Diagnosis not present

## 2016-01-13 ENCOUNTER — Other Ambulatory Visit: Payer: Self-pay | Admitting: Family Medicine

## 2016-01-13 ENCOUNTER — Ambulatory Visit: Payer: 59 | Admitting: Family Medicine

## 2016-01-13 NOTE — Telephone Encounter (Signed)
Medication filled to pharmacy as requested.   

## 2016-01-13 NOTE — Telephone Encounter (Signed)
LM for pt to schedule CPE

## 2016-01-16 ENCOUNTER — Encounter (HOSPITAL_COMMUNITY): Payer: Self-pay

## 2016-01-16 ENCOUNTER — Ambulatory Visit (HOSPITAL_COMMUNITY)
Admission: RE | Admit: 2016-01-16 | Discharge: 2016-01-16 | Disposition: A | Discharge status: Home / Self Care | Payer: 59 | Source: Ambulatory Visit | Attending: Cardiovascular Disease | Admitting: Cardiovascular Disease

## 2016-01-16 DIAGNOSIS — I77810 Thoracic aortic ectasia: Secondary | ICD-10-CM | POA: Diagnosis not present

## 2016-01-16 DIAGNOSIS — R079 Chest pain, unspecified: Secondary | ICD-10-CM | POA: Insufficient documentation

## 2016-01-16 MED ORDER — METOPROLOL TARTRATE 1 MG/ML IV SOLN
5.0000 mg | Freq: Once | INTRAVENOUS | Status: AC
  Administered 2016-01-16: 5 mg via INTRAVENOUS

## 2016-01-16 MED ORDER — NITROGLYCERIN 0.4 MG SL SUBL
0.8000 mg | SUBLINGUAL_TABLET | Freq: Once | SUBLINGUAL | Status: AC
  Administered 2016-01-16: 0.8 mg via SUBLINGUAL

## 2016-01-16 MED ORDER — NITROGLYCERIN 0.4 MG SL SUBL
SUBLINGUAL_TABLET | SUBLINGUAL | Status: AC
  Filled 2016-01-16: qty 2

## 2016-01-16 MED ORDER — IOPAMIDOL (ISOVUE-370) INJECTION 76%
INTRAVENOUS | Status: AC
  Administered 2016-01-16: 100 mL
  Filled 2016-01-16: qty 100

## 2016-01-16 MED ORDER — METOPROLOL TARTRATE 1 MG/ML IV SOLN
INTRAVENOUS | Status: AC
  Filled 2016-01-16: qty 5

## 2016-01-24 ENCOUNTER — Encounter: Payer: Self-pay | Admitting: Cardiovascular Disease

## 2016-01-24 ENCOUNTER — Telehealth: Payer: Self-pay | Admitting: Cardiovascular Disease

## 2016-01-24 DIAGNOSIS — I288 Other diseases of pulmonary vessels: Secondary | ICD-10-CM

## 2016-01-24 NOTE — Telephone Encounter (Signed)
CALLED PATIENT , HAD TO LEAVE MESSAGE TO CALL BACK  ALSO MESSAGE STATED PATIENT WILL BE SENDING MESSAGE VIA MY CHART

## 2016-01-24 NOTE — Telephone Encounter (Signed)
-----   Message from Skeet Latch, MD sent at 01/03/2016  5:28 PM EDT ----- Normal kidney function and electrolytes.  Continue current meds.

## 2016-01-24 NOTE — Telephone Encounter (Signed)
-----   Message from Skeet Latch, MD sent at 01/17/2016  1:29 PM EDT ----- CT scan shows normal coronary arteries and no evidence of plaque.  However, the pulmonary artery was a little enlarged, which can mean that the pressures in her lungs are a high.  Please order an echo for possible pulmonary hypertension evaluation.  Follow up after echo.

## 2016-01-24 NOTE — Telephone Encounter (Signed)
-----   Message from Skeet Latch, MD sent at 01/12/2016 10:45 PM EDT ----- Normal carotid arteries.

## 2016-01-24 NOTE — Telephone Encounter (Signed)
New Message  Pt called states that she is returning call she received on the 21st of April for results. She would also like to discuss if an ECHO is needed at this time. Request a call back to discuss. ( per pt she will send a Mychart message as well)

## 2016-01-29 ENCOUNTER — Encounter: Payer: Self-pay | Admitting: *Deleted

## 2016-01-29 NOTE — Telephone Encounter (Signed)
Patient has echo and follow up scheduled  Message sent in mychart if any questions prior to follow up ov to call

## 2016-02-06 ENCOUNTER — Other Ambulatory Visit: Payer: Self-pay

## 2016-02-06 ENCOUNTER — Ambulatory Visit (HOSPITAL_COMMUNITY): Payer: 59 | Attending: Internal Medicine

## 2016-02-06 DIAGNOSIS — I071 Rheumatic tricuspid insufficiency: Secondary | ICD-10-CM | POA: Diagnosis not present

## 2016-02-06 DIAGNOSIS — I340 Nonrheumatic mitral (valve) insufficiency: Secondary | ICD-10-CM | POA: Insufficient documentation

## 2016-02-06 DIAGNOSIS — I119 Hypertensive heart disease without heart failure: Secondary | ICD-10-CM | POA: Insufficient documentation

## 2016-02-06 DIAGNOSIS — I77810 Thoracic aortic ectasia: Secondary | ICD-10-CM | POA: Insufficient documentation

## 2016-02-06 DIAGNOSIS — I288 Other diseases of pulmonary vessels: Secondary | ICD-10-CM | POA: Diagnosis not present

## 2016-02-06 DIAGNOSIS — I351 Nonrheumatic aortic (valve) insufficiency: Secondary | ICD-10-CM | POA: Insufficient documentation

## 2016-02-11 ENCOUNTER — Ambulatory Visit (INDEPENDENT_AMBULATORY_CARE_PROVIDER_SITE_OTHER): Payer: 59 | Admitting: Cardiovascular Disease

## 2016-02-11 ENCOUNTER — Ambulatory Visit: Payer: 59 | Admitting: Family Medicine

## 2016-02-11 ENCOUNTER — Ambulatory Visit: Payer: 59 | Admitting: Cardiovascular Disease

## 2016-02-11 ENCOUNTER — Encounter: Payer: Self-pay | Admitting: Cardiovascular Disease

## 2016-02-11 VITALS — BP 126/78 | HR 68 | Ht 66.0 in | Wt 279.0 lb

## 2016-02-11 DIAGNOSIS — I7121 Aneurysm of the ascending aorta, without rupture: Secondary | ICD-10-CM | POA: Insufficient documentation

## 2016-02-11 DIAGNOSIS — I517 Cardiomegaly: Secondary | ICD-10-CM

## 2016-02-11 DIAGNOSIS — E669 Obesity, unspecified: Secondary | ICD-10-CM

## 2016-02-11 DIAGNOSIS — I712 Thoracic aortic aneurysm, without rupture: Secondary | ICD-10-CM

## 2016-02-11 DIAGNOSIS — I10 Essential (primary) hypertension: Secondary | ICD-10-CM | POA: Diagnosis not present

## 2016-02-11 HISTORY — DX: Cardiomegaly: I51.7

## 2016-02-11 NOTE — Progress Notes (Signed)
Cardiology Office Note   Date:  02/11/2016   ID:  [name], DOB 26-Jun-1956, MRN [id number]  PCP:  Annye Asa, MD  Cardiologist:   Skeet Latch, MD   Chief Complaint  Patient presents with  . Follow-up    SOB;only when exertion. CHEST PAIN;none.LIGHTHEADED/DIZZINESS;none. PAIN OR CRAMPING IN LEGS;none. EDEMA;in right leg       History of Present Illness: Virginia Matthews is a 60 y.o. female with hypertension and obesity who presents for follow up.  [name] initially presented with atypical chest pain that occurred in stressful situations.  She also has a family history of premature CAD.  Therefore she was referred for cardiac CT angiography and coronary calcium scoring.  Her coronary calcium score was 0 and there were no obstructive lesions.  It showed a dilated pulmonary artery suggestive of pulmonary hypertension and her ascending thoracic aortic was 4.2 cm.  She was referred for echocardiography that revealed PASP 37 mmHg and was otherwise unremarkable.   [name] has been feeling well.  Her only complaint is pain from arthritis.  She denies chest pain.  She continues to have exertional dyspnea and notes mild swelling in the R LE > L LE.  She denies orthopnea or PND. She has not been exercising much.  She hopes to start back gardening soon.   Past Medical History  Diagnosis Date  . Ruptured lumbar disc     L5/S1  . GERD (gastroesophageal reflux disease)   . Seasonal allergies   . PCO (polycystic ovaries)   . Hypertension   . Obesity   . Endometriosis   . Rosacea   . Psoriasis   . Carotid bruit 12/31/2015  . LVH (left ventricular hypertrophy) 02/11/2016    Moderate LVH on echo 02/06/16.  . Ascending aortic aneurysm ([place]) 02/11/2016    4.2 cm on CT 01/16/16.    Past Surgical History  Procedure Laterality Date  . Laparoscopic endometriosis fulguration    . Mole removal      on hip  . Pelvic laparoscopy      DIAG LAP  .  Microdiscectomy lumbar  10/12     Current Outpatient Prescriptions  Medication Sig Dispense Refill  . b complex vitamins tablet Take 1 tablet by mouth daily.    . calcipotriene-betamethasone (TACLONEX) ointment Apply 1 application topically daily.   5  . Calcitriol (VECTICAL) 3 MCG/GM cream Apply 1 application topically at bedtime.    . Clobetasol Propionate (TEMOVATE EX) Apply 1 application topically daily.     . diclofenac (VOLTAREN) 75 MG EC tablet Take 75 mg by mouth daily.  3  . escitalopram (LEXAPRO) 10 MG tablet TAKE 1 TABLET BY MOUTH ONCE DAILY 90 tablet 0  . FINACEA 15 % cream Apply 1 application topically daily.  2  . fluticasone (FLONASE) 50 MCG/ACT nasal spray Place 2 sprays into both nostrils daily. 16 g 3  . HUMIRA PEN 40 MG/0.8ML PNKT Inject 40 mg into the skin every 14 (fourteen) days.  6  . hydrocortisone valerate (WEST-CORT) 0.2 % ointment Apply topically. As needed     . lisinopril (PRINIVIL,ZESTRIL) 10 MG tablet TAKE 1 TABLET BY MOUTH ONCE DAILY 90 tablet 1  . loratadine (CLARITIN) 10 MG tablet Take 10 mg by mouth daily.      . Multiple Vitamins-Minerals (CENTRUM SILVER PO) Take by mouth. Use as directed     . Omega-3 Fatty Acids (FISH OIL PO) Take 1 capsule by mouth daily.    Marland Kitchen  pantoprazole (PROTONIX) 40 MG tablet TAKE 1 TABLET BY MOUTH DAILY. 90 tablet 1  . TACLONEX external suspension Apply 1 application topically as needed.   5  . Turmeric 500 MG TABS Take 500 mg by mouth 2 (two) times daily.    Marland Kitchen VITAMIN D, CHOLECALCIFEROL, PO Take 2,000 Units by mouth daily.      No current facility-administered medications for this visit.    Allergies:   Review of patient's allergies indicates no known allergies.    Social History:  The patient  reports that she has never smoked. She has never used smokeless tobacco. She reports that she drinks alcohol. She reports that she does not use illicit drugs.   Family History:  The patient's family history includes Asthma in her  brother; Breast cancer in her mother; CAD in her brother and father; Cancer in her brother and father; Heart disease in her father; Hypertension in her brother and father. There is no history of Colon cancer.    ROS:  Please see the history of present illness.   Otherwise, review of systems are positive for none.   All other systems are reviewed and negative.    PHYSICAL EXAM: VS:  BP 126/78 mmHg  Pulse 68  Ht 5\' 6"  (1.676 m)  Wt 126.554 kg (279 lb)  BMI 45.05 kg/m2 , BMI Body mass index is 45.05 kg/(m^2). GENERAL:  Well appearing HEENT:  Pupils equal round and reactive, fundi not visualized, oral mucosa unremarkable NECK:  No jugular venous distention, waveform within normal limits, carotid upstroke brisk and symmetric, no carotid bruits LYMPHATICS:  No cervical adenopathy LUNGS:  Clear to auscultation bilaterally HEART:  RRR.  PMI not displaced or sustained,S1 and S2 within normal limits, no S3, no S4, no clicks, no rubs, II/VI murmurs ABD:  Flat, positive bowel sounds normal in frequency in pitch, no bruits, no rebound, no guarding, no midline pulsatile mass, no hepatomegaly, no splenomegaly EXT:  2 plus pulses throughout, no edema, no cyanosis no clubbing SKIN:  No rashes no nodules NEURO:  Cranial nerves II through XII grossly intact, motor grossly intact throughout PSYCH:  Cognitively intact, oriented to person, place and time  EKG:  EKG is not  ordered today.   Cardiac CT-A 01/16/16: Aortic Valve: Trileaflet, normal thickness, no calcifications. Coronary Arteries: Normal coronary origin. Right dominance. Left main is a large artery with no plaque. LAD is a large caliber artery that gives rise to 3 small diagonal branches and wraps around the apex. There is no plaque. 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 LCX is a medium caliber vessel that gives rise to two small OM branches, there is no plaque. RCA is a large caliber that gives rise to PDA and  PLA. There is no plaque. Other findings: Dilated pulmonary artery measuring 36 x 31 mm suggestive of pulmonary hypertension. Normal pulmonary vein drainage into the left atrium. A large left atrial appendage without evidence of a thrombus. IMPRESSION: 1. Coronary calcium score of 0. This was 0 percentile for age and sex matched control. 2. Normal coronary origin with right dominance. 3. No evidence of CAD. 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 4. Dilated pulmonary artery suggestive of pulmonary hypertension.  IMPRESSION: 1. Evidence of probable air trapping in the visualize lung bases, suggesting small airways disease. 2. Ectasia of the ascending thoracic aorta (4.2 cm in diameter).   Echo 02/06/16:  Study Conclusions  - Left ventricle: The cavity  size was normal. Wall thickness was  increased in a pattern of moderate LVH. Systolic function was  normal. The estimated ejection fraction was in the range of 60%  to 65%. Wall motion was normal; there were no regional wall  motion abnormalities. Left ventricular diastolic function  parameters were normal. - Aortic valve: Trileaflet. There was no stenosis. There was  trivial regurgitation. - Ascending aorta: The ascending aorta was dilated to 4 cm. - Mitral valve: Mildly thickened leaflets . There was trivial  regurgitation. - Left atrium: The atrium was normal in size. - Right ventricle: The cavity size was mildly dilated. Systolic  function is reduced. - Right atrium: The atrium was mildly dilated. - Tricuspid valve: There was trivial regurgitation. - Pulmonary arteries: The main pulmonary artery was not  well-visualized. PA peak pressure: 37 mm Hg (S). - Systemic veins: The IVC measures <2.1 cm, but does not collapse  >50%, suggesting an elevated RA pressure of 8 mmHg.  Impressions:  - LVEF 60-65%, moderate LVH, normal wall motion, normal diastolic  function, normal LA size, mildly  dilated ascending aorta to 4.0  cm. Mild RAE and RVE with reduced RV systolic function, trivial  TR, RVSP 37 mmHg, mildly elevated RA pressure of 8 mmHg.  Recent Labs: 07/05/2015: Hemoglobin 12.9; Platelets 262.0 12/31/2015: BUN 23; Creat 0.77; Potassium 4.8; Sodium 139    Lipid Panel    Component Value Date/Time   CHOL 144 01/03/2015 0838   TRIG 63.0 01/03/2015 0838   HDL 45.80 01/03/2015 0838   CHOLHDL 3 01/03/2015 0838   VLDL 12.6 01/03/2015 0838   LDLCALC 86 01/03/2015 0838      Wt Readings from Last 3 Encounters:  02/11/16 126.554 kg (279 lb)  12/31/15 125.737 kg (277 lb 3.2 oz)  12/05/15 125.601 kg (276 lb 14.4 oz)      ASSESSMENT AND PLAN:  # Atypical chest pain: CT-A showed a myocardial bridge but no obstruction.  Her coronary calcium score was 0.  Therefore, we will stop aspirin.  Her ASCVD 10 year risk is 3.4%.    # Hypertension: BP well-controlled.  Continue lisinopril.  # Obesity: [name] was encouraged to start back with her exercise routine.  Ideally, she would exercise at least 30-40 minutes most days of the week.   # Carotid bruit: No carotid stenosis on carotid Doppler.   # Ascending thoracic aortic aneurysm: She is asymptomatic.  Blood pressure is well-controlled on lisinopril.  We will repeat her CT-A in 6 months to assess for stability.  # Moderate LVH:  # RV dilation:  Noted on echo.  She has no heart failure symptoms.  BP control as above.  Repeat echo in 1 year.   Current medicines are reviewed at length with the patient today.  The patient does not have concerns regarding medicines.  The following changes have been made:  no change  Labs/ tests ordered today include:   No orders of the defined types were placed in this encounter.     Disposition:   FU with Devonia Farro C. Oval Linsey, MD, [facility] in 1 year    This note was written with the assistance of speech recognition software.  Please excuse any transcriptional  errors.  Signed, Apurva Reily C. Oval Linsey, MD, [facility]  02/11/2016 10:02 AM    [place]

## 2016-02-11 NOTE — Patient Instructions (Signed)
Medication Instructions:  STOP ASPIRIN   Labwork: NONE  Testing/Procedures: Your physician has requested that you have an echocardiogram. Echocardiography is a painless test that uses sound waves to create images of your heart. It provides your doctor with information about the size and shape of your heart and how well your heart's chambers and valves are working. This procedure takes approximately one hour. There are no restrictions for this procedure. 1 YEAR  CT OF CHEST IN 1 YEAR  Follow-Up: Your physician wants you to follow-up in: [place] CT AND ECHO You will receive a reminder letter in the mail two months in advance. If you don't receive a letter, please call our office to schedule the follow-up appointment.  If you need a refill on your cardiac medications before your next appointment, please call your pharmacy.

## 2016-02-17 ENCOUNTER — Other Ambulatory Visit: Payer: Self-pay | Admitting: Family Medicine

## 2016-02-17 NOTE — Telephone Encounter (Signed)
Medication filled to pharmacy as requested.   

## 2016-03-12 DIAGNOSIS — R768 Other specified abnormal immunological findings in serum: Secondary | ICD-10-CM | POA: Diagnosis not present

## 2016-03-12 DIAGNOSIS — L4050 Arthropathic psoriasis, unspecified: Secondary | ICD-10-CM | POA: Diagnosis not present

## 2016-03-12 DIAGNOSIS — L409 Psoriasis, unspecified: Secondary | ICD-10-CM | POA: Diagnosis not present

## 2016-03-12 DIAGNOSIS — Z79899 Other long term (current) drug therapy: Secondary | ICD-10-CM | POA: Diagnosis not present

## 2016-03-12 DIAGNOSIS — M170 Bilateral primary osteoarthritis of knee: Secondary | ICD-10-CM | POA: Diagnosis not present

## 2016-03-19 ENCOUNTER — Encounter: Payer: 59 | Admitting: Pharmacist

## 2016-04-14 ENCOUNTER — Other Ambulatory Visit: Payer: Self-pay | Admitting: Family Medicine

## 2016-04-14 DIAGNOSIS — Z1231 Encounter for screening mammogram for malignant neoplasm of breast: Secondary | ICD-10-CM

## 2016-04-16 ENCOUNTER — Ambulatory Visit: Payer: 59 | Attending: Internal Medicine | Admitting: Pharmacist

## 2016-04-16 DIAGNOSIS — L4050 Arthropathic psoriasis, unspecified: Secondary | ICD-10-CM

## 2016-04-16 MED ORDER — ADALIMUMAB 40 MG/0.8ML ~~LOC~~ AJKT: 40.0000 mg | AUTO-INJECTOR | SUBCUTANEOUS | Status: DC

## 2016-04-16 NOTE — Progress Notes (Signed)
   S: Patient presents today to the [facility].  Patient is currently taking Humira for psoriatic arthritis. Patient is managed by Dr. Trudie Reed for this.   Adherence: denies any missed doses. Reports rotating injection sites.  Dosing:  Psoriatic arthritis: SubQ: 40 mg every other week (may continue methotrexate, other nonbiologic DMARDS, corticosteroids, NSAIDs and/or analgesics)  Drug-drug interactions: multiple vitamin D analogs on list but patient is not taking them all.   Screening: TB test: completed per patient Hepatitis: completed per patient  Monitoring: S/sx of infection: denies CBC: q3 months S/sx of hypersensitivity: denies S/sx of malignancy: denies S/sx of heart failure: denies    O:     Lab Results  Component Value Date   WBC 9.9 07/05/2015   HGB 12.9 07/05/2015   HCT 38.9 07/05/2015   MCV 90.4 07/05/2015   PLT 262.0 07/05/2015      Chemistry      Component Value Date/Time   NA 139 12/31/2015 0901   K 4.8 12/31/2015 0901   CL 104 12/31/2015 0901   CO2 24 12/31/2015 0901   BUN 23 12/31/2015 0901   CREATININE 0.77 12/31/2015 0901   CREATININE 0.75 07/05/2015 1006      Component Value Date/Time   CALCIUM 9.2 12/31/2015 0901   ALKPHOS 81 01/03/2015 0838   AST 24 01/03/2015 0838   ALT 23 01/03/2015 0838   BILITOT 0.4 01/03/2015 0838       A/P: 1. Medication review: Patient on Humira for psoriatic arthritis and is tolerating it well with no adverse effects and with improved control of her psoriatic arthritis compared to prior to Humira but currently experiencing some decrease in efficacy. Reviewed the medication with her, including the following: Humira is a TNF blocking agent indicated for ankylosing spondylitis, Crohn's disease, Hidradenitis suppurativa, psoriatic arthritis, plaque psoriasis, ulcerative colitis, and uveitis. The most common adverse effects are infections, headache, and injection site  reactions. There is the possibility of an increased risk of malignancy but it is not well understood if this increased risk is due to there medication or the disease state. There are rare cases of pancytopenia and aplastic anemia. No recommendations for changes.    Nicoletta Ba, PharmD, BCPS, BCACP, [facility] [phone]

## 2016-04-17 ENCOUNTER — Encounter: Payer: Self-pay | Admitting: Family Medicine

## 2016-04-17 ENCOUNTER — Ambulatory Visit (INDEPENDENT_AMBULATORY_CARE_PROVIDER_SITE_OTHER): Payer: 59 | Admitting: Family Medicine

## 2016-04-17 ENCOUNTER — Other Ambulatory Visit: Payer: Self-pay | Admitting: Family Medicine

## 2016-04-17 VITALS — BP 123/80 | HR 70 | Temp 98.2°F | Resp 16 | Ht 66.0 in | Wt 280.1 lb

## 2016-04-17 DIAGNOSIS — Z0000 Encounter for general adult medical examination without abnormal findings: Secondary | ICD-10-CM

## 2016-04-17 DIAGNOSIS — Z0001 Encounter for general adult medical examination with abnormal findings: Secondary | ICD-10-CM

## 2016-04-17 DIAGNOSIS — L4050 Arthropathic psoriasis, unspecified: Secondary | ICD-10-CM | POA: Diagnosis not present

## 2016-04-17 MED ORDER — ESCITALOPRAM OXALATE 10 MG PO TABS: 10.0000 mg | ORAL_TABLET | Freq: Every day | ORAL | Status: DC

## 2016-04-17 NOTE — Assessment & Plan Note (Signed)
New.  Pt is following w/ Dr Trudie Reed.  Now on Humira.

## 2016-04-17 NOTE — Telephone Encounter (Signed)
Medication filled to pharmacy as requested.   

## 2016-04-17 NOTE — Assessment & Plan Note (Signed)
Ongoing issue.  Pt is attempting to work on healthy diet but is not able to exercise due to psoriatic arthritis.  Check labs to risk stratify.  Will follow.

## 2016-04-17 NOTE — Progress Notes (Signed)
   Subjective:    Patient ID: Virginia Matthews, female    DOB: July 28, 1956, 60 y.o.   MRN: [id number]  HPI CPE- UTD on colonoscopy, mammo scheduled for next week.  Overdue for pap.  Pt thinks she got Tdap at work.   Review of Systems Patient reports no vision/ hearing changes, adenopathy,fever, weight change,  persistant/recurrent hoarseness , swallowing issues, chest pain, palpitations, edema, persistant/recurrent cough, hemoptysis, dyspnea (rest/exertional/paroxysmal nocturnal), gastrointestinal bleeding (melena, rectal bleeding), abdominal pain, significant heartburn, bowel changes, GU symptoms (dysuria, hematuria, incontinence), Gyn symptoms (abnormal  bleeding, pain),  syncope, focal weakness, memory loss, numbness & tingling, skin/hair/nail changes, abnormal bruising or bleeding, anxiety, or depression.     Objective:   Physical Exam General Appearance:    Alert, cooperative, no distress, appears stated age, obese  Head:    Normocephalic, without obvious abnormality, atraumatic  Eyes:    PERRL, conjunctiva/corneas clear, EOM's intact, fundi    benign, both eyes  Ears:    Normal TM's and external ear canals, both ears  Nose:   Nares normal, septum midline, mucosa normal, no drainage    or sinus tenderness  Throat:   Lips, mucosa, and tongue normal; teeth and gums normal  Neck:   Supple, symmetrical, trachea midline, no adenopathy;    Thyroid: no enlargement/tenderness/nodules  Back:     Symmetric, no curvature, ROM normal, no CVA tenderness  Lungs:     Clear to auscultation bilaterally, respirations unlabored  Chest Wall:    No tenderness or deformity   Heart:    Regular rate and rhythm, S1 and S2 normal, no murmur, rub   or gallop  Breast Exam:    Deferred to GYN  Abdomen:     Soft, non-tender, bowel sounds active all four quadrants,    no masses, no organomegaly  Genitalia:    Deferred to GYN  Rectal:    Extremities:   Extremities normal, atraumatic, no cyanosis or edema    Pulses:   2+ and symmetric all extremities  Skin:   Skin color, texture, turgor normal, no rashes or lesions  Lymph nodes:   Cervical, supraclavicular, and axillary nodes normal  Neurologic:   CNII-XII intact, normal strength, sensation and reflexes    throughout          Assessment & Plan:

## 2016-04-17 NOTE — Progress Notes (Signed)
Pre visit review using our clinic review tool, if applicable. No additional management support is needed unless otherwise documented below in the visit note. 

## 2016-04-17 NOTE — Assessment & Plan Note (Addendum)
Pt's PE unchanged from previous.  Obese but pt is not able to exercise due to psoriatic arthritis.  Attempting to make healthy food choices.  Reviewed recent labs done at [facility]- CBC, CMP WNL.  Check TSH, lipids, and Vit D.  UTD on colonoscopy, mammo scheduled.  Pt to schedule pap.  Anticipatory guidance provided.

## 2016-04-17 NOTE — Patient Instructions (Addendum)
Follow up in 6 months to recheck blood pressure Schedule a lab visit at your convenience at whichever site is closest [facility] notify you of your lab results and make any changes if needed Continue to work on healthy diet and exercise as able Call and schedule your pap!!! Call with any questions or concerns Happy [employer]day!!! Have a great time in [place]!!!

## 2016-04-29 ENCOUNTER — Ambulatory Visit
Admission: RE | Admit: 2016-04-29 | Discharge: 2016-04-29 | Disposition: A | Discharge status: Home / Self Care | Payer: 59 | Source: Ambulatory Visit | Attending: Family Medicine | Admitting: Family Medicine

## 2016-04-29 DIAGNOSIS — Z1231 Encounter for screening mammogram for malignant neoplasm of breast: Secondary | ICD-10-CM | POA: Diagnosis not present

## 2016-04-30 ENCOUNTER — Other Ambulatory Visit: Payer: Self-pay | Admitting: Family Medicine

## 2016-04-30 DIAGNOSIS — R928 Other abnormal and inconclusive findings on diagnostic imaging of breast: Secondary | ICD-10-CM

## 2016-05-01 ENCOUNTER — Encounter: Payer: Self-pay | Admitting: Family Medicine

## 2016-05-05 ENCOUNTER — Ambulatory Visit
Admission: RE | Admit: 2016-05-05 | Discharge: 2016-05-05 | Disposition: A | Discharge status: Home / Self Care | Payer: 59 | Source: Ambulatory Visit | Attending: Family Medicine | Admitting: Family Medicine

## 2016-05-05 ENCOUNTER — Other Ambulatory Visit: Payer: Self-pay | Admitting: Family Medicine

## 2016-05-05 DIAGNOSIS — N63 Unspecified lump in breast: Secondary | ICD-10-CM | POA: Diagnosis not present

## 2016-05-05 DIAGNOSIS — R928 Other abnormal and inconclusive findings on diagnostic imaging of breast: Secondary | ICD-10-CM

## 2016-05-05 DIAGNOSIS — N6320 Unspecified lump in the left breast, unspecified quadrant: Secondary | ICD-10-CM

## 2016-05-07 ENCOUNTER — Ambulatory Visit
Admission: RE | Admit: 2016-05-07 | Discharge: 2016-05-07 | Disposition: A | Discharge status: Home / Self Care | Payer: 59 | Source: Ambulatory Visit | Attending: Family Medicine | Admitting: Family Medicine

## 2016-05-07 ENCOUNTER — Other Ambulatory Visit: Payer: Self-pay | Admitting: Family Medicine

## 2016-05-07 DIAGNOSIS — N6320 Unspecified lump in the left breast, unspecified quadrant: Secondary | ICD-10-CM

## 2016-05-07 DIAGNOSIS — D242 Benign neoplasm of left breast: Secondary | ICD-10-CM | POA: Diagnosis not present

## 2016-05-07 DIAGNOSIS — N63 Unspecified lump in breast: Secondary | ICD-10-CM | POA: Diagnosis not present

## 2016-05-19 DIAGNOSIS — D242 Benign neoplasm of left breast: Secondary | ICD-10-CM | POA: Diagnosis not present

## 2016-05-22 ENCOUNTER — Other Ambulatory Visit: Payer: Self-pay | Admitting: General Surgery

## 2016-05-22 DIAGNOSIS — D242 Benign neoplasm of left breast: Secondary | ICD-10-CM

## 2016-05-25 ENCOUNTER — Other Ambulatory Visit: Payer: Self-pay | Admitting: General Surgery

## 2016-05-25 ENCOUNTER — Telehealth: Payer: Self-pay | Admitting: Emergency Medicine

## 2016-05-25 NOTE — Telephone Encounter (Signed)
-----   Message from Midge Minium, MD sent at 05/24/2016  6:00 PM EDT ----- Please have pt return for labs.  Thanks!  KT, MD ----- Message ----- From: SYSTEM Sent: 05/23/2016  12:05 AM To: Midge Minium, MD

## 2016-05-25 NOTE — Telephone Encounter (Signed)
Left message on machine advising patient she is due for her labs from CPE 04/17/16.

## 2016-05-26 ENCOUNTER — Other Ambulatory Visit: Payer: Self-pay | Admitting: General Surgery

## 2016-05-26 DIAGNOSIS — D242 Benign neoplasm of left breast: Secondary | ICD-10-CM

## 2016-06-02 ENCOUNTER — Encounter: Payer: Self-pay | Admitting: Emergency Medicine

## 2016-06-02 NOTE — Progress Notes (Signed)
Mailed letter to schedule lab visit for previous visit

## 2016-06-09 DIAGNOSIS — R768 Other specified abnormal immunological findings in serum: Secondary | ICD-10-CM | POA: Diagnosis not present

## 2016-06-09 DIAGNOSIS — L4050 Arthropathic psoriasis, unspecified: Secondary | ICD-10-CM | POA: Diagnosis not present

## 2016-06-09 DIAGNOSIS — M79641 Pain in right hand: Secondary | ICD-10-CM | POA: Diagnosis not present

## 2016-06-09 DIAGNOSIS — L409 Psoriasis, unspecified: Secondary | ICD-10-CM | POA: Diagnosis not present

## 2016-06-09 DIAGNOSIS — M79642 Pain in left hand: Secondary | ICD-10-CM | POA: Diagnosis not present

## 2016-06-09 DIAGNOSIS — M170 Bilateral primary osteoarthritis of knee: Secondary | ICD-10-CM | POA: Diagnosis not present

## 2016-06-11 DIAGNOSIS — D2371 Other benign neoplasm of skin of right lower limb, including hip: Secondary | ICD-10-CM | POA: Diagnosis not present

## 2016-06-11 DIAGNOSIS — D1801 Hemangioma of skin and subcutaneous tissue: Secondary | ICD-10-CM | POA: Diagnosis not present

## 2016-06-11 DIAGNOSIS — L814 Other melanin hyperpigmentation: Secondary | ICD-10-CM | POA: Diagnosis not present

## 2016-06-11 DIAGNOSIS — L918 Other hypertrophic disorders of the skin: Secondary | ICD-10-CM | POA: Diagnosis not present

## 2016-06-11 DIAGNOSIS — D2272 Melanocytic nevi of left lower limb, including hip: Secondary | ICD-10-CM | POA: Diagnosis not present

## 2016-06-11 DIAGNOSIS — L400 Psoriasis vulgaris: Secondary | ICD-10-CM | POA: Diagnosis not present

## 2016-06-11 DIAGNOSIS — D225 Melanocytic nevi of trunk: Secondary | ICD-10-CM | POA: Diagnosis not present

## 2016-06-11 DIAGNOSIS — D2261 Melanocytic nevi of right upper limb, including shoulder: Secondary | ICD-10-CM | POA: Diagnosis not present

## 2016-06-12 ENCOUNTER — Other Ambulatory Visit: Payer: Self-pay | Admitting: Pharmacist

## 2016-06-12 MED ORDER — ADALIMUMAB 40 MG/0.8ML ~~LOC~~ AJKT: 40.0000 mg | AUTO-INJECTOR | SUBCUTANEOUS | 6 refills | Status: DC

## 2016-06-12 MED ORDER — ADALIMUMAB 40 MG/0.8ML ~~LOC~~ AJKT
40.0000 mg | AUTO-INJECTOR | SUBCUTANEOUS | 1 refills | Status: DC
Start: 2016-06-12 — End: 2016-06-12

## 2016-06-12 NOTE — Telephone Encounter (Signed)
Humira dose has been increased by rheumatology for hopefully more control of psoriatic arthritis. Dose is appropriate. Patient aware of dose change.

## 2016-06-28 DIAGNOSIS — N6320 Unspecified lump in the left breast, unspecified quadrant: Secondary | ICD-10-CM

## 2016-06-28 HISTORY — DX: Unspecified lump in the left breast, unspecified quadrant: N63.20

## 2016-07-09 ENCOUNTER — Encounter (HOSPITAL_BASED_OUTPATIENT_CLINIC_OR_DEPARTMENT_OTHER): Payer: Self-pay | Admitting: *Deleted

## 2016-07-09 NOTE — Pre-Procedure Instructions (Signed)
To come to pick up Boost Breeze 8 oz.

## 2016-07-09 NOTE — Pre-Procedure Instructions (Signed)
Ascending thoracic aortic aneurysm discussed with Dr. Al Corpus; pt. OK to come for surgery.

## 2016-07-14 ENCOUNTER — Encounter: Payer: Self-pay | Admitting: Family Medicine

## 2016-07-14 ENCOUNTER — Ambulatory Visit
Admission: RE | Admit: 2016-07-14 | Discharge: 2016-07-14 | Disposition: A | Discharge status: Home / Self Care | Payer: 59 | Source: Ambulatory Visit | Attending: General Surgery | Admitting: General Surgery

## 2016-07-14 DIAGNOSIS — D242 Benign neoplasm of left breast: Secondary | ICD-10-CM

## 2016-07-14 NOTE — Progress Notes (Signed)
Pt given a carton of boost breeze to drink by 630 morning of surgery given written and verbal instructions ,  Teach back ,  Pt voiced understanding

## 2016-07-16 ENCOUNTER — Encounter (HOSPITAL_BASED_OUTPATIENT_CLINIC_OR_DEPARTMENT_OTHER)
Admission: RE | Disposition: A | Discharge status: Home / Self Care | Payer: Self-pay | Source: Ambulatory Visit | Attending: General Surgery

## 2016-07-16 ENCOUNTER — Ambulatory Visit (HOSPITAL_BASED_OUTPATIENT_CLINIC_OR_DEPARTMENT_OTHER)
Admission: RE | Admit: 2016-07-16 | Discharge: 2016-07-16 | Disposition: A | Discharge status: Home / Self Care | Payer: 59 | Source: Ambulatory Visit | Attending: General Surgery | Admitting: General Surgery

## 2016-07-16 ENCOUNTER — Ambulatory Visit (HOSPITAL_BASED_OUTPATIENT_CLINIC_OR_DEPARTMENT_OTHER): Payer: 59 | Admitting: Certified Registered"

## 2016-07-16 ENCOUNTER — Ambulatory Visit
Admission: RE | Admit: 2016-07-16 | Discharge: 2016-07-16 | Disposition: A | Discharge status: Home / Self Care | Payer: 59 | Source: Ambulatory Visit | Attending: General Surgery | Admitting: General Surgery

## 2016-07-16 ENCOUNTER — Encounter (HOSPITAL_BASED_OUTPATIENT_CLINIC_OR_DEPARTMENT_OTHER): Payer: Self-pay | Admitting: Certified Registered"

## 2016-07-16 DIAGNOSIS — M1990 Unspecified osteoarthritis, unspecified site: Secondary | ICD-10-CM | POA: Insufficient documentation

## 2016-07-16 DIAGNOSIS — Z79899 Other long term (current) drug therapy: Secondary | ICD-10-CM | POA: Diagnosis not present

## 2016-07-16 DIAGNOSIS — K219 Gastro-esophageal reflux disease without esophagitis: Secondary | ICD-10-CM | POA: Insufficient documentation

## 2016-07-16 DIAGNOSIS — F329 Major depressive disorder, single episode, unspecified: Secondary | ICD-10-CM | POA: Diagnosis not present

## 2016-07-16 DIAGNOSIS — D242 Benign neoplasm of left breast: Secondary | ICD-10-CM

## 2016-07-16 DIAGNOSIS — I10 Essential (primary) hypertension: Secondary | ICD-10-CM | POA: Insufficient documentation

## 2016-07-16 DIAGNOSIS — Z7951 Long term (current) use of inhaled steroids: Secondary | ICD-10-CM | POA: Diagnosis not present

## 2016-07-16 DIAGNOSIS — Z6841 Body Mass Index (BMI) 40.0 and over, adult: Secondary | ICD-10-CM | POA: Insufficient documentation

## 2016-07-16 DIAGNOSIS — I739 Peripheral vascular disease, unspecified: Secondary | ICD-10-CM | POA: Diagnosis not present

## 2016-07-16 DIAGNOSIS — Z791 Long term (current) use of non-steroidal anti-inflammatories (NSAID): Secondary | ICD-10-CM | POA: Insufficient documentation

## 2016-07-16 DIAGNOSIS — N6320 Unspecified lump in the left breast, unspecified quadrant: Secondary | ICD-10-CM | POA: Diagnosis not present

## 2016-07-16 DIAGNOSIS — F419 Anxiety disorder, unspecified: Secondary | ICD-10-CM | POA: Diagnosis not present

## 2016-07-16 DIAGNOSIS — N6012 Diffuse cystic mastopathy of left breast: Secondary | ICD-10-CM | POA: Diagnosis not present

## 2016-07-16 HISTORY — PX: RADIOACTIVE SEED GUIDED EXCISIONAL BREAST BIOPSY: SHX6490

## 2016-07-16 HISTORY — DX: Thoracic aortic aneurysm, without rupture: I71.2

## 2016-07-16 HISTORY — DX: Carpal tunnel syndrome, bilateral upper limbs: G56.03

## 2016-07-16 HISTORY — DX: Unspecified lump in the left breast, unspecified quadrant: N63.20

## 2016-07-16 HISTORY — DX: Other specified postprocedural states: Z98.890

## 2016-07-16 HISTORY — DX: Cardiac murmur, unspecified: R01.1

## 2016-07-16 HISTORY — DX: Arthropathic psoriasis, unspecified: L40.50

## 2016-07-16 HISTORY — DX: Aneurysm of the ascending aorta, without rupture: I71.21

## 2016-07-16 HISTORY — DX: Nausea with vomiting, unspecified: R11.2

## 2016-07-16 SURGERY — RADIOACTIVE SEED GUIDED BREAST BIOPSY
Anesthesia: General | Site: Breast | Laterality: Left

## 2016-07-16 MED ORDER — ACETAMINOPHEN 500 MG PO TABS
1000.0000 mg | ORAL_TABLET | ORAL | Status: AC
  Administered 2016-07-16: 1000 mg via ORAL

## 2016-07-16 MED ORDER — MIDAZOLAM HCL 2 MG/2ML IJ SOLN
1.0000 mg | INTRAMUSCULAR | Status: DC | PRN
  Administered 2016-07-16: 1 mg via INTRAVENOUS

## 2016-07-16 MED ORDER — DEXAMETHASONE SODIUM PHOSPHATE 10 MG/ML IJ SOLN
INTRAMUSCULAR | Status: AC
Start: 2016-07-16 — End: 2016-07-16
  Filled 2016-07-16: qty 1

## 2016-07-16 MED ORDER — HYDROMORPHONE HCL 1 MG/ML IJ SOLN
INTRAMUSCULAR | Status: AC
  Filled 2016-07-16: qty 1

## 2016-07-16 MED ORDER — PROMETHAZINE HCL 25 MG/ML IJ SOLN: 6.2500 mg | INTRAMUSCULAR | Status: DC | PRN

## 2016-07-16 MED ORDER — CEFAZOLIN SODIUM-DEXTROSE 2-4 GM/100ML-% IV SOLN
INTRAVENOUS | Status: AC
  Filled 2016-07-16: qty 100

## 2016-07-16 MED ORDER — CHLORHEXIDINE GLUCONATE CLOTH 2 % EX PADS: 6.0000 | MEDICATED_PAD | Freq: Once | CUTANEOUS | Status: DC

## 2016-07-16 MED ORDER — LACTATED RINGERS IV SOLN
INTRAVENOUS | Status: DC
  Administered 2016-07-16 (×2): via INTRAVENOUS

## 2016-07-16 MED ORDER — PROPOFOL 10 MG/ML IV BOLUS
INTRAVENOUS | Status: DC | PRN
  Administered 2016-07-16: 150 mg via INTRAVENOUS

## 2016-07-16 MED ORDER — BUPIVACAINE HCL (PF) 0.25 % IJ SOLN
INTRAMUSCULAR | Status: DC | PRN
  Administered 2016-07-16: 10 mL

## 2016-07-16 MED ORDER — FENTANYL CITRATE (PF) 100 MCG/2ML IJ SOLN
50.0000 ug | INTRAMUSCULAR | Status: DC | PRN
Start: 2016-07-16 — End: 2016-07-16
  Administered 2016-07-16: 100 ug via INTRAVENOUS

## 2016-07-16 MED ORDER — FENTANYL CITRATE (PF) 100 MCG/2ML IJ SOLN
INTRAMUSCULAR | Status: AC
  Filled 2016-07-16: qty 2

## 2016-07-16 MED ORDER — LIDOCAINE 2% (20 MG/ML) 5 ML SYRINGE
INTRAMUSCULAR | Status: DC | PRN
  Administered 2016-07-16: 60 mg via INTRAVENOUS

## 2016-07-16 MED ORDER — SCOPOLAMINE 1 MG/3DAYS TD PT72: 1.0000 | MEDICATED_PATCH | Freq: Once | TRANSDERMAL | Status: DC | PRN

## 2016-07-16 MED ORDER — HYDROMORPHONE HCL 1 MG/ML IJ SOLN
0.2500 mg | INTRAMUSCULAR | Status: DC | PRN
  Administered 2016-07-16: 0.5 mg via INTRAVENOUS

## 2016-07-16 MED ORDER — ACETAMINOPHEN 500 MG PO TABS
ORAL_TABLET | ORAL | Status: AC
  Filled 2016-07-16: qty 2

## 2016-07-16 MED ORDER — MIDAZOLAM HCL 2 MG/2ML IJ SOLN
INTRAMUSCULAR | Status: AC
  Filled 2016-07-16: qty 2

## 2016-07-16 MED ORDER — ONDANSETRON HCL 4 MG/2ML IJ SOLN
INTRAMUSCULAR | Status: AC
  Filled 2016-07-16: qty 2

## 2016-07-16 MED ORDER — DEXAMETHASONE SODIUM PHOSPHATE 4 MG/ML IJ SOLN
INTRAMUSCULAR | Status: DC | PRN
  Administered 2016-07-16: 10 mg via INTRAVENOUS

## 2016-07-16 MED ORDER — GLYCOPYRROLATE 0.2 MG/ML IJ SOLN
0.2000 mg | Freq: Once | INTRAMUSCULAR | Status: AC | PRN
  Administered 2016-07-16: 0.2 mg via INTRAVENOUS

## 2016-07-16 MED ORDER — ONDANSETRON HCL 4 MG/2ML IJ SOLN
INTRAMUSCULAR | Status: DC | PRN
  Administered 2016-07-16: 4 mg via INTRAVENOUS

## 2016-07-16 MED ORDER — CEFAZOLIN SODIUM-DEXTROSE 2-4 GM/100ML-% IV SOLN
2.0000 g | INTRAVENOUS | Status: AC
  Administered 2016-07-16: 2 g via INTRAVENOUS

## 2016-07-16 MED ORDER — GABAPENTIN 300 MG PO CAPS
ORAL_CAPSULE | ORAL | Status: AC
  Filled 2016-07-16: qty 1

## 2016-07-16 MED ORDER — GABAPENTIN 300 MG PO CAPS
300.0000 mg | ORAL_CAPSULE | ORAL | Status: AC
  Administered 2016-07-16: 300 mg via ORAL

## 2016-07-16 SURGICAL SUPPLY — 57 items
BINDER BREAST LRG (GAUZE/BANDAGES/DRESSINGS) IMPLANT
BINDER BREAST MEDIUM (GAUZE/BANDAGES/DRESSINGS) IMPLANT
BINDER BREAST XLRG (GAUZE/BANDAGES/DRESSINGS) IMPLANT
BINDER BREAST XXLRG (GAUZE/BANDAGES/DRESSINGS) ×2 IMPLANT
BLADE SURG 15 STRL LF DISP TIS (BLADE) ×1 IMPLANT
BLADE SURG 15 STRL SS (BLADE) ×1
CANISTER SUC SOCK COL 7IN (MISCELLANEOUS) IMPLANT
CANISTER SUCT 1200ML W/VALVE (MISCELLANEOUS) IMPLANT
CHLORAPREP W/TINT 26ML (MISCELLANEOUS) ×2 IMPLANT
CLIP TI WIDE RED SMALL 6 (CLIP) ×2 IMPLANT
COVER BACK TABLE 60X90IN (DRAPES) ×2 IMPLANT
COVER MAYO STAND STRL (DRAPES) ×2 IMPLANT
COVER PROBE W GEL 5X96 (DRAPES) ×2 IMPLANT
DECANTER SPIKE VIAL GLASS SM (MISCELLANEOUS) IMPLANT
DERMABOND ADVANCED (GAUZE/BANDAGES/DRESSINGS) ×1
DERMABOND ADVANCED .7 DNX12 (GAUZE/BANDAGES/DRESSINGS) ×1 IMPLANT
DEVICE DUBIN W/COMP PLATE 8390 (MISCELLANEOUS) ×2 IMPLANT
DRAPE LAPAROSCOPIC ABDOMINAL (DRAPES) ×2 IMPLANT
DRAPE UTILITY XL STRL (DRAPES) ×2 IMPLANT
DRSG TEGADERM 4X4.75 (GAUZE/BANDAGES/DRESSINGS) IMPLANT
ELECT COATED BLADE 2.86 ST (ELECTRODE) ×2 IMPLANT
ELECT REM PT RETURN 9FT ADLT (ELECTROSURGICAL) ×2
ELECTRODE REM PT RTRN 9FT ADLT (ELECTROSURGICAL) ×1 IMPLANT
GLOVE BIO SURGEON STRL SZ7 (GLOVE) ×4 IMPLANT
GLOVE BIOGEL PI IND STRL 7.0 (GLOVE) ×2 IMPLANT
GLOVE BIOGEL PI IND STRL 7.5 (GLOVE) ×1 IMPLANT
GLOVE BIOGEL PI INDICATOR 7.0 (GLOVE) ×2
GLOVE BIOGEL PI INDICATOR 7.5 (GLOVE) ×1
GLOVE ECLIPSE 6.5 STRL STRAW (GLOVE) ×2 IMPLANT
GOWN STRL REUS W/ TWL LRG LVL3 (GOWN DISPOSABLE) ×2 IMPLANT
GOWN STRL REUS W/TWL LRG LVL3 (GOWN DISPOSABLE) ×2
HEMOSTAT ARISTA ABSORB 3G PWDR (MISCELLANEOUS) ×2 IMPLANT
ILLUMINATOR WAVEGUIDE N/F (MISCELLANEOUS) IMPLANT
KIT MARKER MARGIN INK (KITS) ×2 IMPLANT
LIGHT WAVEGUIDE WIDE FLAT (MISCELLANEOUS) IMPLANT
NEEDLE HYPO 25X1 1.5 SAFETY (NEEDLE) ×2 IMPLANT
NS IRRIG 1000ML POUR BTL (IV SOLUTION) IMPLANT
PACK BASIN DAY SURGERY FS (CUSTOM PROCEDURE TRAY) ×2 IMPLANT
PENCIL BUTTON HOLSTER BLD 10FT (ELECTRODE) ×2 IMPLANT
SHEET MEDIUM DRAPE 40X70 STRL (DRAPES) IMPLANT
SLEEVE SCD COMPRESS KNEE MED (MISCELLANEOUS) ×2 IMPLANT
SPONGE GAUZE 4X4 12PLY STER LF (GAUZE/BANDAGES/DRESSINGS) IMPLANT
SPONGE LAP 4X18 X RAY DECT (DISPOSABLE) ×2 IMPLANT
STRIP CLOSURE SKIN 1/2X4 (GAUZE/BANDAGES/DRESSINGS) ×2 IMPLANT
SUT MNCRL AB 4-0 PS2 18 (SUTURE) IMPLANT
SUT MON AB 5-0 PS2 18 (SUTURE) IMPLANT
SUT SILK 2 0 SH (SUTURE) ×2 IMPLANT
SUT VIC AB 2-0 SH 27 (SUTURE) ×1
SUT VIC AB 2-0 SH 27XBRD (SUTURE) ×1 IMPLANT
SUT VIC AB 3-0 SH 27 (SUTURE) ×1
SUT VIC AB 3-0 SH 27X BRD (SUTURE) ×1 IMPLANT
SUT VIC AB 5-0 PS2 18 (SUTURE) IMPLANT
SYR CONTROL 10ML LL (SYRINGE) ×2 IMPLANT
TOWEL OR 17X24 6PK STRL BLUE (TOWEL DISPOSABLE) ×2 IMPLANT
TOWEL OR NON WOVEN STRL DISP B (DISPOSABLE) ×2 IMPLANT
TUBE CONNECTING 20X1/4 (TUBING) IMPLANT
YANKAUER SUCT BULB TIP NO VENT (SUCTIONS) IMPLANT

## 2016-07-16 NOTE — Anesthesia Preprocedure Evaluation (Signed)
Anesthesia Evaluation  Patient identified by MRN, date of birth, ID band Patient awake    Reviewed: Allergy & Precautions, NPO status , Patient's Chart, lab work & pertinent test results  History of Anesthesia Complications (+) PONV and history of anesthetic complications  Airway        Dental   Pulmonary neg pulmonary ROS,           Cardiovascular hypertension, + Peripheral Vascular Disease       Neuro/Psych  Headaches, PSYCHIATRIC DISORDERS Anxiety    GI/Hepatic GERD  ,  Endo/Other    Renal/GU      Musculoskeletal   Abdominal   Peds  Hematology   Anesthesia Other Findings   Reproductive/Obstetrics                             Anesthesia Physical Anesthesia Plan Anesthesia Quick Evaluation

## 2016-07-16 NOTE — Op Note (Signed)
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 Serita Grammes Anesthesia: Gen. Estimated blood loss: Minimal Complications: None Drains: None Specimens:Leftbreast tissue marked with paint, additional inferior tissue marked with short stitch superior, long stitch lateral double stitch deep Sponge and needle count correct at completion Disposition to recovery stable  Indications: This is a 22 yof who has a left breast mass on mm.  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  We discussed options and she desired excision. We discussed excision with seed guidance.    Procedure: After informed consent was obtained she was then taken to the operating room. She was given cefazolin. Sequential compression devices were on her legs. She was placed under general anesthesia without complication. Her leftbreast was then prepped and draped in the standard sterile surgical fashion. A surgical timeout was then performed.  I located the radioactive seed with the neoprobe.I infiltrated marcaine in the area of the seed.  I made a periareolar incision.I then used the neoprobe to guide the excision of the seed and surrounding tissue. This was confirmed by the neoprobe. The seed was removed separately. This was painted. This was then taken for mammogram which confirmed removal of the seed and the clip. This was confirmed by radiology. I did remove a little additional inferior tissue as the seed was close to this margin.  This was then sent to pathology. Hemostasis was observed.I closed the breast tissue with a 2-0 Vicryl. The dermis was closed with 3-0 Vicryl and the skin with 5-0 Monocryl.Dermabond and steristrips were placed on the incision. She was transferred to recovery stable

## 2016-07-16 NOTE — Anesthesia Preprocedure Evaluation (Signed)
Anesthesia Evaluation  Patient identified by MRN, date of birth, ID band Patient awake    Reviewed: Allergy & Precautions, NPO status , Patient's Chart, lab work & pertinent test results  History of Anesthesia Complications (+) PONV and history of anesthetic complications  Airway Mallampati: II  TM Distance: >3 FB Neck ROM: Full    Dental no notable dental hx. (+) Dental Advisory Given   Pulmonary neg pulmonary ROS,    Pulmonary exam normal        Cardiovascular hypertension, Pt. on medications + Peripheral Vascular Disease  Normal cardiovascular exam     Neuro/Psych PSYCHIATRIC DISORDERS Anxiety Depression negative neurological ROS     GI/Hepatic Neg liver ROS, GERD  Controlled,  Endo/Other  Morbid obesity  Renal/GU negative Renal ROS     Musculoskeletal   Abdominal   Peds  Hematology   Anesthesia Other Findings   Reproductive/Obstetrics                             Anesthesia Physical Anesthesia Plan  ASA: III  Anesthesia Plan: General   Post-op Pain Management:    Induction: Intravenous  Airway Management Planned: LMA and Oral ETT  Additional Equipment:   Intra-op Plan:   Post-operative Plan: Extubation in OR  Informed Consent: I have reviewed the patients History and Physical, chart, labs and discussed the procedure including the risks, benefits and alternatives for the proposed anesthesia with the patient or authorized representative who has indicated his/her understanding and acceptance.   Dental advisory given  Plan Discussed with: CRNA, Anesthesiologist and Surgeon  Anesthesia Plan Comments:         Anesthesia Quick Evaluation

## 2016-07-16 NOTE — Discharge Instructions (Signed)
[facility] °Office Phone Number [phone] ° °POST OP INSTRUCTIONS ° °Always review your discharge instruction sheet given to you by the facility where your surgery was performed. ° °IF YOU HAVE DISABILITY OR FAMILY LEAVE FORMS, YOU MUST BRING THEM TO THE OFFICE FOR PROCESSING.  DO NOT GIVE THEM TO YOUR DOCTOR. ° °1. A prescription for pain medication may be given to you upon discharge.  Take your pain medication as prescribed, if needed.  If narcotic pain medicine is not needed, then you may take acetaminophen (Tylenol), naprosyn (Alleve) or ibuprofen (Advil) as needed. °2. Take your usually prescribed medications unless otherwise directed °3. If you need a refill on your pain medication, please contact your pharmacy.  They will contact our office to request authorization.  Prescriptions will not be filled after 5pm or on week-ends. °4. You should eat very light the first 24 hours after surgery, such as soup, crackers, pudding, etc.  Resume your normal diet the day after surgery. °5. Most patients will experience some swelling and bruising in the breast.  Ice packs and a good support bra will help.  Wear the breast binder provided or a sports bra for 72 hours day and night.  After that wear a sports bra during the day until you return to the office. Swelling and bruising can take several days to resolve.  °6. It is common to experience some constipation if taking pain medication after surgery.  Increasing fluid intake and taking a stool softener will usually help or prevent this problem from occurring.  A mild laxative (Milk of Magnesia or Miralax) should be taken according to package directions if there are no bowel movements after 48 hours. °7. Unless discharge instructions indicate otherwise, you may remove your bandages 48 hours after surgery and you may shower at that time.  You may have steri-strips (small skin tapes) in place directly over the incision.  These strips should be left on the  skin for 7-10 days and will come off on their own.  If your surgeon used skin glue on the incision, you may shower in 24 hours.  The glue will flake off over the next 2-3 weeks.  Any sutures or staples will be removed at the office during your follow-up visit. °8. ACTIVITIES:  You may resume regular daily activities (gradually increasing) beginning the next day.  Wearing a good support bra or sports bra minimizes pain and swelling.  You may have sexual intercourse when it is comfortable. °a. You may drive when you no longer are taking prescription pain medication, you can comfortably wear a seatbelt, and you can safely maneuver your car and apply brakes. °b. RETURN TO WORK:  ______________________________________________________________________________________ °9. You should see your doctor in the office for a follow-up appointment approximately two weeks after your surgery.  Your doctor’s nurse will typically make your follow-up appointment when she calls you with your pathology report.  Expect your pathology report 3-4 business days after your surgery.  You may call to check if you do not hear from us after three days. °10. OTHER INSTRUCTIONS: _______________________________________________________________________________________________ _____________________________________________________________________________________________________________________________________ °_____________________________________________________________________________________________________________________________________ °_____________________________________________________________________________________________________________________________________ ° °WHEN TO CALL DR WAKEFIELD: °1. Fever over 101.0 °2. Nausea and/or vomiting. °3. Extreme swelling or bruising. °4. Continued bleeding from incision. °5. Increased pain, redness, or drainage from the incision. ° °The clinic staff is available to answer your questions during regular  business hours.  Please don’t hesitate to call and ask to speak to one of the nurses for clinical concerns.  If   you have a medical emergency, go to the nearest emergency room or call 911.  A surgeon from [facility] is always on call at the hospital. ° °For further questions, please visit [email or web address] ° ° ° °Post Anesthesia Home Care Instructions ° °Activity: °Get plenty of rest for the remainder of the day. A responsible adult should stay with you for 24 hours following the procedure.  °For the next 24 hours, DO NOT: °-Drive a car °-Operate machinery °-Drink alcoholic beverages °-Take any medication unless instructed by your physician °-Make any legal decisions or sign important papers. ° °Meals: °Start with liquid foods such as gelatin or soup. Progress to regular foods as tolerated. Avoid greasy, spicy, heavy foods. If nausea and/or vomiting occur, drink only clear liquids until the nausea and/or vomiting subsides. Call your physician if vomiting continues. ° °Special Instructions/Symptoms: °Your throat may feel dry or sore from the anesthesia or the breathing tube placed in your throat during surgery. If this causes discomfort, gargle with warm salt water. The discomfort should disappear within 24 hours. ° °If you had a scopolamine patch placed behind your ear for the management of post- operative nausea and/or vomiting: ° °1. The medication in the patch is effective for 72 hours, after which it should be removed.  Wrap patch in a tissue and discard in the trash. Wash hands thoroughly with soap and water. °2. You may remove the patch earlier than 72 hours if you experience unpleasant side effects which may include dry mouth, dizziness or visual disturbances. °3. Avoid touching the patch. Wash your hands with soap and water after contact with the patch. °  ° °

## 2016-07-16 NOTE — Anesthesia Postprocedure Evaluation (Signed)
Anesthesia Post Note  Patient: Virginia Matthews [facility]  Procedure(s) Performed: Procedure(s) (LRB): LEFT RADIOACTIVE SEED GUIDED EXCISIONAL BREAST BIOPSY (Left)  Patient location during evaluation: PACU Anesthesia Type: General Level of consciousness: sedated Pain management: pain level controlled Vital Signs Assessment: post-procedure vital signs reviewed and stable Respiratory status: spontaneous breathing and respiratory function stable Cardiovascular status: stable Anesthetic complications: no    Last Vitals:  Vitals:   07/16/16 1145 07/16/16 1200  BP: 107/69   Pulse: (!) 47 67  Resp: 13 14  Temp:      Last Pain:  Vitals:   07/16/16 1130  TempSrc:   PainSc: 2                  Ali Mohl DANIEL

## 2016-07-16 NOTE — Anesthesia Procedure Notes (Signed)
Procedure Name: LMA Insertion Date/Time: 07/16/2016 10:24 AM Performed by: Baxter Flattery Pre-anesthesia Checklist: Patient identified, Emergency Drugs available, Suction available and Patient being monitored Patient Re-evaluated:Patient Re-evaluated prior to inductionOxygen Delivery Method: Circle system utilized Preoxygenation: Pre-oxygenation with 100% oxygen Intubation Type: IV induction Ventilation: Mask ventilation without difficulty LMA: LMA inserted LMA Size: 4.0 Number of attempts: 1 Airway Equipment and Method: Bite block Placement Confirmation: positive ETCO2 and breath sounds checked- equal and bilateral Tube secured with: Tape Dental Injury: Teeth and Oropharynx as per pre-operative assessment

## 2016-07-16 NOTE — Interval H&P Note (Signed)
History and Physical Interval Note:  07/16/2016 10:05 AM  Virginia Matthews  has presented today for surgery, with the diagnosis of LEFT BREAST MASS  The various methods of treatment have been discussed with the patient and family. After consideration of risks, benefits and other options for treatment, the patient has consented to  Procedure(s): LEFT RADIOACTIVE SEED GUIDED EXCISIONAL BREAST BIOPSY (Left) as a surgical intervention .  The patient's history has been reviewed, patient examined, no change in status, stable for surgery.  I have reviewed the patient's chart and labs.  Questions were answered to the patient's satisfaction.     Tynasia Mccaul

## 2016-07-16 NOTE — H&P (Addendum)
60 yof referred by Dr Enrique Sack for screening detected left breast mass. she has family history in her mother, no personal history. she has b density breasts. she has a left breast mass on screening that on dx is a 7 mm mass. on [place] there is a 6x6x6 mm mass in subareolar region of the left breast. 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    Other Problems  Arthritis Gastroesophageal Reflux Disease Hemorrhoids Lump In Breast Other disease, cancer, significant illness  Past Surgical History Colon Polyp Removal - Colonoscopy Oral Surgery Spinal Surgery - Lower Back  Diagnostic Studies History Colonoscopy within last year Mammogram within last year Pap Smear >5 years ago  Allergies  No Known Drug Allergies  Medication History Fish Oil (1000MG  Capsule, Oral) Active. Hydrocortisone Valerate (0.2% Cream, External) Active. Loratadine (10MG  Tablet, Oral) Active. Multiple Vitamin (Oral) Active. Turmeric (500MG  Tablet, Oral) Active. Vitamin E (Oral) Specific dose unknown - Active. (2000 units) Escitalopram Oxalate (10MG  Tablet, Oral) Active. Adalimumab (Subcutaneous) Specific dose unknown - Active. Pantoprazole Sodium (40MG  Tablet DR, Oral) Active. Lisinopril (10MG  Tablet, Oral) Active. Diclofenac Sodium (75MG  Tablet DR, Oral) Active. Flonase (Nasal) Specific dose unknown - Active. Medications Reconciled  Social History  Alcohol use Occasional alcohol use. Caffeine use Coffee. No drug use Tobacco use Never smoker.  Family History  Alcohol Abuse Brother. Arthritis Father. Breast Cancer Mother. Heart Disease Brother, Father. Heart disease in female family member before age 69 Heart disease in female family member before age 76 Hypertension Father. Prostate Cancer Brother, Father. Respiratory Condition Brother.  Pregnancy / Birth History  Age at menarche 60 years. Age of menopause 85-55 Gravida 2 Length (months) of  breastfeeding 7-12 Maternal age 37-40 Para 1  Review of Systems General Not Present- Appetite Loss, Chills, Fatigue, Fever, Night Sweats, Weight Gain and Weight Loss. Skin Present- Rash. Not Present- Change in Wart/Mole, Dryness, Hives, Jaundice, New Lesions, Non-Healing Wounds and Ulcer. HEENT Present- Seasonal Allergies. Not Present- Earache, Hearing Loss, Hoarseness, Nose Bleed, Oral Ulcers, Ringing in the Ears, Sinus Pain, Sore Throat, Visual Disturbances, Wears glasses/contact lenses and Yellow Eyes. Respiratory Not Present- Bloody sputum, Chronic Cough, Difficulty Breathing, Snoring and Wheezing. Cardiovascular Not Present- Chest Pain, Difficulty Breathing Lying Down, Leg Cramps, Palpitations, Rapid Heart Rate, Shortness of Breath and Swelling of Extremities. Gastrointestinal Present- Hemorrhoids. Not Present- Abdominal Pain, Bloating, Bloody Stool, Change in Bowel Habits, Chronic diarrhea, Constipation, Difficulty Swallowing, Excessive gas, Gets full quickly at meals, Indigestion, Nausea, Rectal Pain and Vomiting. Female Genitourinary Not Present- Frequency, Nocturia, Painful Urination, Pelvic Pain and Urgency. Musculoskeletal Present- Joint Pain. Not Present- Back Pain, Joint Stiffness, Muscle Pain, Muscle Weakness and Swelling of Extremities. Neurological Not Present- Decreased Memory, Fainting, Headaches, Numbness, Seizures, Tingling, Tremor, Trouble walking and Weakness. Psychiatric Not Present- Anxiety, Bipolar, Change in Sleep Pattern, Depression, Fearful and Frequent crying. Endocrine Not Present- Cold Intolerance, Excessive Hunger, Hair Changes, Heat Intolerance, Hot flashes and New Diabetes. Hematology Not Present- Blood Thinners, Easy Bruising, Excessive bleeding, Gland problems, HIV and Persistent Infections.  Vitals  Weight: 286 lb Height: 66in Height was reported by patient. Body Surface Area: 2.33 m Body Mass Index: 46.16 kg/m  Temp.: 98.11F(Oral)  Pulse: 64  (Regular)  P.OX: 98% (Room air) BP: 130/88 (Sitting, Left Arm, Standard)  Physical Exam  General Mental Status-Alert. Orientation-Oriented X3. Chest and Lung Exam Chest and lung exam reveals -quiet, even and easy respiratory effort with no use of accessory muscles and on auscultation, normal breath sounds, no adventitious sounds and normal  vocal resonance. Breast Nipples-No Discharge. Breast Lump-No Palpable Breast Mass. Cardiovascular Cardiovascular examination reveals -normal heart sounds, regular rate and rhythm with no murmurs. Lymphatic Head & Neck General Head & Neck Lymphatics: Bilateral - Description - Normal. Axillary General Axillary Region: Bilateral - Description - Normal. Note: no [place] adenopathy   Assessment & Plan  PAPILLOMA OF LEFT BREAST (D24.2) Story: Left breast seed guided excisional biopsy Discussed options including excision vs observation. she strongly desires excision. will plan on seed guided excision. risks/recovery discussed

## 2016-07-16 NOTE — Transfer of Care (Signed)
Immediate Anesthesia Transfer of Care Note  Patient: Virginia Matthews [facility]  Procedure(s) Performed: Procedure(s): LEFT RADIOACTIVE SEED GUIDED EXCISIONAL BREAST BIOPSY (Left)  Patient Location: PACU  Anesthesia Type:General  Level of Consciousness: awake, alert , oriented and patient cooperative  Airway & Oxygen Therapy: Patient Spontanous Breathing and Patient connected to face mask oxygen  Post-op Assessment: Report given to RN, Post -op Vital signs reviewed and stable and Patient moving all extremities  Post vital signs: Reviewed and stable  Last Vitals:  Vitals:   07/16/16 0903  BP: (!) 128/53  Pulse: (!) 55  Resp: 20  Temp: 36.5 C    Last Pain:  Vitals:   07/16/16 0903  TempSrc: Oral         Complications: No apparent anesthesia complications

## 2016-07-17 ENCOUNTER — Other Ambulatory Visit: Payer: Self-pay | Admitting: Family Medicine

## 2016-07-17 ENCOUNTER — Encounter (HOSPITAL_BASED_OUTPATIENT_CLINIC_OR_DEPARTMENT_OTHER): Payer: Self-pay | Admitting: General Surgery

## 2016-08-26 ENCOUNTER — Telehealth: Payer: Self-pay | Admitting: Cardiovascular Disease

## 2016-08-26 ENCOUNTER — Ambulatory Visit (INDEPENDENT_AMBULATORY_CARE_PROVIDER_SITE_OTHER): Payer: 59 | Admitting: Physician Assistant

## 2016-08-26 ENCOUNTER — Encounter: Payer: Self-pay | Admitting: Physician Assistant

## 2016-08-26 VITALS — BP 115/82 | HR 59 | Ht 66.0 in | Wt 290.2 lb

## 2016-08-26 DIAGNOSIS — N63 Unspecified lump in unspecified breast: Secondary | ICD-10-CM

## 2016-08-26 DIAGNOSIS — I10 Essential (primary) hypertension: Secondary | ICD-10-CM

## 2016-08-26 DIAGNOSIS — R002 Palpitations: Secondary | ICD-10-CM

## 2016-08-26 NOTE — Telephone Encounter (Signed)
New message    Patient c/o Palpitations:  High priority if patient c/o lightheadedness and shortness of breath.  1. How long have you been having palpitations? weeks 2. Are you currently experiencing lightheadedness and shortness of breath? no 3. Have you checked your BP and heart rate? (document readings) 60- 68 4. Are you experiencing any other symptoms? no

## 2016-08-26 NOTE — Patient Instructions (Signed)
Medication Instructions:  Continue current medications  Labwork: TSH Today  Testing/Procedures: Your physician has recommended that you wear a 24 hour holter monitor. Holter monitors are medical devices that record the heart's electrical activity. Doctors most often use these monitors to diagnose arrhythmias. Arrhythmias are problems with the speed or rhythm of the heartbeat. The monitor is a small, portable device. You can wear one while you do your normal daily activities. This is usually used to diagnose what is causing palpitations/syncope (passing out).    Follow-Up: Your physician recommends that you schedule a follow-up appointment in: 1 Month   Any Other Special Instructions Will Be Listed Below (If Applicable).     If you need a refill on your cardiac medications before your next appointment, please call your pharmacy.

## 2016-08-26 NOTE — Telephone Encounter (Signed)
Spoke with pt states that she is experiencing intermittent palpitations and is unable to relate these to anything she states that there are no other symptoms. They happen while she is sitting down, and seem to be happening more frequently lately. Pt states that she does not have BP monitor. Appt scheduled for this afternoon

## 2016-08-26 NOTE — Progress Notes (Signed)
Cardiology Office Note    Date:  08/27/2016   ID:  [name], DOB [birth year], MRN [id number]  PCP:  Annye Asa, MD  Cardiologist:  Dr. Oval Linsey  Chief Complaint  Patient presents with  . Follow-up    seen for Dr. Oval Linsey, eval for palpitation    History of Present Illness:  Virginia Matthews is a 60 y.o. female with PMH of thoracic ascending aortic aneurysm, moderate LVH, hypertension, GERD, and left breast mass underwent recent biopsy. Based on previous cardiology office note, it appears patient has history of atypical chest discomfort that occurs in stressful situation. She also has early family history of premature CAD. She underwent cardiac CT angiography and a coronary calcium scoring. Her calcium score was 0 and that there was no obstructive lesion. It did show dilated pulmonary artery suggestive of pulmonary hypertension and her ascending thoracic aorta was 4.2 cm. She was referred for echo that revealed PASP 37 mmHg however otherwise was unremarkable. Her last office visit was on 02/11/2016, at which time she continued to have exertional dyspnea, and mild swelling in the right compared to the left lower extremity. She recently underwent core biopsy on 07/16/2016 with pathology being intraductal papilloma and eventually underwent excision with seed guidance.  Patient called cardiology office on 08/26/2016 with complaint of palpitation. According to the patient, this has been going on for several weeks where she would suddenly have pounding sensation in her chest, sometimes it lasts only 1-3 beats, other time it occurs in a streaming. Besides the palpitation, she does not have any other symptoms, she denies any chest discomfort, shortness of breath, dizziness or presyncope. There has not been any swelling recently. As far as her recent breast surgery, she was told after recent excision surgery, everything was good. She is a oncology nurse, most of the time, she  works in Data processing manager position. She does not do much strenuous activity, however has been able to ambulate recently without any exertional symptoms.   Past Medical History:  Diagnosis Date  . Breast mass, left 06/2016  . Carpal tunnel syndrome on both sides   . GERD (gastroesophageal reflux disease)   . Heart murmur   . Hypertension    states under control with med., has been on med. x 3-4 yr.  . LVH (left ventricular hypertrophy)    moderate, per echo 01/2016  . Obesity   . PONV (postoperative nausea and vomiting)   . Psoriatic arthritis ([place])   . Rosacea   . Thoracic ascending aortic aneurysm [facility])     Past Surgical History:  Procedure Laterality Date  . COLONOSCOPY WITH PROPOFOL  09/12/2015  . LAPAROSCOPIC ENDOMETRIOSIS FULGURATION    . MICRODISCECTOMY LUMBAR Right 07/16/2011   L4-5  . PELVIC LAPAROSCOPY     DIAG LAP  . RADIOACTIVE SEED GUIDED EXCISIONAL BREAST BIOPSY Left 07/16/2016   Procedure: LEFT RADIOACTIVE SEED GUIDED EXCISIONAL BREAST BIOPSY;  Surgeon: Rolm Bookbinder, MD;  Location: [place];  Service: General;  Laterality: Left;    Current Medications: Outpatient Medications Prior to Visit  Medication Sig Dispense Refill  . Adalimumab (HUMIRA PEN) 40 MG/0.8ML PNKT Inject 40 mg into the skin once a week. 4 each 6  . b complex vitamins tablet Take 1 tablet by mouth daily.    . calcipotriene-betamethasone (TACLONEX) ointment Apply topically daily.    . diclofenac (VOLTAREN) 75 MG EC tablet Take 75 mg by mouth 2 (two) times daily.   3  . escitalopram (LEXAPRO) 10  MG tablet Take 1 tablet (10 mg total) by mouth daily. 90 tablet 1  . fluticasone (FLONASE) 50 MCG/ACT nasal spray Place 2 sprays into both nostrils daily. 16 g 3  . hydrocortisone valerate (WEST-CORT) 0.2 % ointment Apply topically. As needed     . lisinopril (PRINIVIL,ZESTRIL) 10 MG tablet TAKE 1 TABLET BY MOUTH ONCE DAILY 90 tablet 1  . loratadine (CLARITIN) 10 MG tablet Take 10 mg by  mouth daily.      . metroNIDAZOLE (METROCREAM) 0.75 % cream Apply topically 2 (two) times daily.    . Multiple Vitamins-Minerals (CENTRUM SILVER PO) Take by mouth. Use as directed     . Omega-3 Fatty Acids (FISH OIL PO) Take 1 capsule by mouth daily.    . pantoprazole (PROTONIX) 40 MG tablet TAKE 1 TABLET BY MOUTH DAILY. 90 tablet 3  . Turmeric 500 MG TABS Take 500 mg by mouth 2 (two) times daily.    Marland Kitchen VITAMIN D, CHOLECALCIFEROL, PO Take 2,000 Units by mouth daily.      No facility-administered medications prior to visit.      Allergies:   Adhesive [tape]   Social History   Social History  . Marital status: Married    Spouse name: N/A  . Number of children: N/A  . Years of education: N/A   Social History Main Topics  . Smoking status: Never Smoker  . Smokeless tobacco: Never Used  . Alcohol use 0.0 oz/week     Comment: socially  . Drug use: No  . Sexual activity: Not Asked   Other Topics Concern  . None   Social History Narrative  . None     Family History:  The patient's family history includes Asthma in her brother; Breast cancer in her mother; CAD in her brother and father; Cancer in her brother and father; Heart disease in her father; Hypertension in her brother and father.   ROS:   Please see the history of present illness.    ROS All other systems reviewed and are negative.   PHYSICAL EXAM:   VS:  BP 115/82   Pulse (!) 59   Ht 5\' 6"  (1.676 m)   Wt 290 lb 3.2 oz (131.6 kg)   BMI 46.84 kg/m    GEN: Well nourished, well developed, in no acute distress  HEENT: normal  Neck: no JVD, carotid bruits, or masses Cardiac: RRR; no murmurs, rubs, or gallops,no edema  Respiratory:  clear to auscultation bilaterally, normal work of breathing GI: soft, nontender, nondistended, + BS MS: no deformity or atrophy  Skin: warm and dry, no rash Neuro:  Alert and Oriented x 3, Strength and sensation are intact Psych: euthymic mood, full affect  Wt Readings from Last 3  Encounters:  08/26/16 290 lb 3.2 oz (131.6 kg)  07/16/16 281 lb (127.5 kg)  04/17/16 280 lb 2 oz (127.1 kg)      Studies/Labs Reviewed:   EKG:  EKG is ordered today.  The ekg ordered today demonstrates Normal sinus rhythm without significant ST-T wave changes, incomplete left bundle-branch block.  Recent Labs: 12/31/2015: BUN 23; Creat 0.77; Potassium 4.8; Sodium 139   Lipid Panel    Component Value Date/Time   CHOL 144 01/03/2015 0838   TRIG 63.0 01/03/2015 0838   HDL 45.80 01/03/2015 0838   CHOLHDL 3 01/03/2015 0838   VLDL 12.6 01/03/2015 0838   LDLCALC 86 01/03/2015 0838    Additional studies/ records that were reviewed today include:   Coronary CT 01/16/2016  Dilated pulmonary artery measuring 36 x 31 mm suggestive of pulmonary hypertension. Normal pulmonary vein drainage into the left atrium. A large left atrial appendage without evidence of a thrombus. IMPRESSION: 1. Coronary calcium score of 0. This was 0 percentile for age and sex matched control. 2. Normal coronary origin with right dominance. 3. No evidence of CAD. 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 4. Dilated pulmonary artery suggestive of pulmonary hypertension.    Echo 02/06/2016 LV EF: 60% -   65%  - Left ventricle: The cavity size was normal. Wall thickness was   increased in a pattern of moderate LVH. Systolic function was   normal. The estimated ejection fraction was in the range of 60%   to 65%. Wall motion was normal; there were no regional wall   motion abnormalities. Left ventricular diastolic function   parameters were normal. - Aortic valve: Trileaflet. There was no stenosis. There was   trivial regurgitation. - Ascending aorta: The ascending aorta was dilated to 4 cm. - Mitral valve: Mildly thickened leaflets . There was trivial   regurgitation. - Left atrium: The atrium was normal in size. - Right ventricle: The cavity size was mildly dilated. Systolic    function is reduced. - Right atrium: The atrium was mildly dilated. - Tricuspid valve: There was trivial regurgitation. - Pulmonary arteries: The main pulmonary artery was not   well-visualized. PA peak pressure: 37 mm Hg (S). - Systemic veins: The IVC measures <2.1 cm, but does not collapse   >50%, suggesting an elevated RA pressure of 8 mmHg.  Impressions:  - LVEF 60-65%, moderate LVH, normal wall motion, normal diastolic   function, normal LA size, mildly dilated ascending aorta to 4.0   cm. Mild RAE and RVE with reduced RV systolic function, trivial   TR, RVSP 37 mmHg, mildly elevated RA pressure of 8 mmHg.  ASSESSMENT:    1. Palpitation   2. Essential hypertension   3. Breast mass in female      PLAN:  In order of problems listed above:  1. Palpitation: She denies any significant alcohol use, she does drink one cup of coffee per day. I told her to cut back on caffeinated drinks. What she described is likely PVCs, couplets and triplets. However we will obtain a 24-hour Holter monitor to make sure there is no SVT. She is currently on lisinopril and her blood pressure well controlled, once Holter monitor result is back, I will likely switch her lisinopril to metoprolol tartrate. 2. Hypertension: blood pressure well controlled on metoprolol 3. Breast mass, recently underwent excision.    Medication Adjustments/Labs and Tests Ordered: Current medicines are reviewed at length with the patient today.  Concerns regarding medicines are outlined above.  Medication changes, Labs and Tests ordered today are listed in the Patient Instructions below. Patient Instructions  Medication Instructions:  Continue current medications  Labwork: TSH Today  Testing/Procedures: Your physician has recommended that you wear a 24 hour holter monitor. Holter monitors are medical devices that record the heart's electrical activity. Doctors most often use these monitors to diagnose arrhythmias.  Arrhythmias are problems with the speed or rhythm of the heartbeat. The monitor is a small, portable device. You can wear one while you do your normal daily activities. This is usually used to diagnose what is causing palpitations/syncope (passing out).    Follow-Up: Your physician recommends that you schedule a follow-up appointment in: 1 Month   Any Other Special Instructions Will Be  Listed Below (If Applicable).     If you need a refill on your cardiac medications before your next appointment, please call your pharmacy.      Hilbert Corrigan, [place]  08/27/2016 12:06 AM    [address], [place], [place]  [address] Phone: [phone]; Fax: [phone]

## 2016-08-26 NOTE — Telephone Encounter (Signed)
Patient seen in office today by me

## 2016-08-27 ENCOUNTER — Encounter: Payer: Self-pay | Admitting: Physician Assistant

## 2016-08-27 LAB — TSH: TSH: 2.41 mIU/L

## 2016-09-07 ENCOUNTER — Ambulatory Visit (INDEPENDENT_AMBULATORY_CARE_PROVIDER_SITE_OTHER): Payer: 59

## 2016-09-07 DIAGNOSIS — R002 Palpitations: Secondary | ICD-10-CM

## 2016-09-10 DIAGNOSIS — L4050 Arthropathic psoriasis, unspecified: Secondary | ICD-10-CM | POA: Diagnosis not present

## 2016-09-10 DIAGNOSIS — L409 Psoriasis, unspecified: Secondary | ICD-10-CM | POA: Diagnosis not present

## 2016-09-10 DIAGNOSIS — Z6841 Body Mass Index (BMI) 40.0 and over, adult: Secondary | ICD-10-CM | POA: Diagnosis not present

## 2016-09-10 DIAGNOSIS — M2550 Pain in unspecified joint: Secondary | ICD-10-CM | POA: Diagnosis not present

## 2016-09-10 DIAGNOSIS — R768 Other specified abnormal immunological findings in serum: Secondary | ICD-10-CM | POA: Diagnosis not present

## 2016-09-10 DIAGNOSIS — M170 Bilateral primary osteoarthritis of knee: Secondary | ICD-10-CM | POA: Diagnosis not present

## 2016-09-10 DIAGNOSIS — Z79899 Other long term (current) drug therapy: Secondary | ICD-10-CM | POA: Diagnosis not present

## 2016-09-22 NOTE — Progress Notes (Signed)
Cardiology Office Note    Date:  09/23/2016   ID:  DAGNY DEEG, DOB [birth year], MRN [id number]  PCP:  Annye Asa, MD  Cardiologist:  Dr. Oval Linsey  Chief Complaint  Patient presents with  . Follow-up    seen for Dr. Oval Linsey, palpitation    History of Present Illness:  Virginia Matthews is a 60 y.o. female with PMH of HTN, moderate LVH, and a history of thoracic ascending aortic aneurysm. She has a family history of premature CAD. She was previously seen in May for atypical chest pain, coronary calcium score was 0. Coronary CT did not show any significant obstructive lesion. It did however show pulmonary artery dilatation suggestive of pulmonary hypertension and dilated ascending aortic thoracic aorta of 4.2 cm. She was referred for echocardiogram that showed PSP 37 mmHg.  During office visit on 02/11/2016, she denied any chest discomfort, however continued to have exertional dyspnea and mild swelling in the right lower extremity compared to the left. Plan was to focus on blood pressure control. CT angiogram in 6 month to reassess stability. He was most recently admitted in October 2017 for left breast seed guided excisional biopsy, fortunately the pathology report does not reveal any significant malignancy.  More recently I saw the patient in the office on 08/26/2016, she was complaining of some palpitation which has been going on for several weeks when she would have sudden sensation of pounding in her chest. It did order a 24-hour Holter monitor which showed average heart rate 66, minimal heart rate 45, maximum heart rate 119, she had frequent PVCs accounting for 1.4% of all rhythm, she also had ventricular trigeminy and occasional PACs. Her TSH was normal.  Patient presents today for cardiology office visit. I have reviewed her most recent Holter monitor reading with her. She continued to have palpitation especially at night when she is resting. Her symptom is most  consistent with symptomatic PVCs. I plan to stop her lisinopril had to switch to low-dose metoprolol tartrate 12.5 mg twice a day. She does have some baseline fatigue and hopefully this dose beta blocker does not worsen it. She will come back for reassessment in 3 month.   Past Medical History:  Diagnosis Date  . Breast mass, left 06/2016  . Carpal tunnel syndrome on both sides   . GERD (gastroesophageal reflux disease)   . Heart murmur   . Hypertension    states under control with med., has been on med. x 3-4 yr.  . LVH (left ventricular hypertrophy)    moderate, per echo 01/2016  . Obesity   . PONV (postoperative nausea and vomiting)   . Psoriatic arthritis ([place])   . Rosacea   . Thoracic ascending aortic aneurysm [facility])     Past Surgical History:  Procedure Laterality Date  . COLONOSCOPY WITH PROPOFOL  09/12/2015  . LAPAROSCOPIC ENDOMETRIOSIS FULGURATION    . MICRODISCECTOMY LUMBAR Right 07/16/2011   L4-5  . PELVIC LAPAROSCOPY     DIAG LAP  . RADIOACTIVE SEED GUIDED EXCISIONAL BREAST BIOPSY Left 07/16/2016   Procedure: LEFT RADIOACTIVE SEED GUIDED EXCISIONAL BREAST BIOPSY;  Surgeon: Rolm Bookbinder, MD;  Location: [place];  Service: General;  Laterality: Left;    Current Medications: Outpatient Medications Prior to Visit  Medication Sig Dispense Refill  . Adalimumab (HUMIRA PEN) 40 MG/0.8ML PNKT Inject 40 mg into the skin once a week. 4 each 6  . b complex vitamins tablet Take 1 tablet by mouth daily.    Marland Kitchen  calcipotriene-betamethasone (TACLONEX) ointment Apply topically daily.    . diclofenac (VOLTAREN) 75 MG EC tablet Take 75 mg by mouth 2 (two) times daily.   3  . escitalopram (LEXAPRO) 10 MG tablet Take 1 tablet (10 mg total) by mouth daily. 90 tablet 1  . fluticasone (FLONASE) 50 MCG/ACT nasal spray Place 2 sprays into both nostrils daily. 16 g 3  . hydrocortisone valerate (WEST-CORT) 0.2 % ointment Apply 1 application topically daily as needed. As  needed     . loratadine (CLARITIN) 10 MG tablet Take 10 mg by mouth daily.      . metroNIDAZOLE (METROCREAM) 0.75 % cream Apply topically 2 (two) times daily.    . Multiple Vitamins-Minerals (CENTRUM SILVER PO) Take 1 tablet by mouth daily. Use as directed     . Omega-3 Fatty Acids (FISH OIL PO) Take 1 capsule by mouth daily.    . pantoprazole (PROTONIX) 40 MG tablet TAKE 1 TABLET BY MOUTH DAILY. 90 tablet 3  . Turmeric 500 MG TABS Take 500 mg by mouth 2 (two) times daily.    Marland Kitchen VITAMIN D, CHOLECALCIFEROL, PO Take 2,000 Units by mouth daily.     Marland Kitchen lisinopril (PRINIVIL,ZESTRIL) 10 MG tablet TAKE 1 TABLET BY MOUTH ONCE DAILY 90 tablet 1   No facility-administered medications prior to visit.      Allergies:   Adhesive [tape]   Social History   Social History  . Marital status: Married    Spouse name: N/A  . Number of children: N/A  . Years of education: N/A   Social History Main Topics  . Smoking status: Never Smoker  . Smokeless tobacco: Never Used  . Alcohol use 0.0 oz/week     Comment: socially  . Drug use: No  . Sexual activity: Not Asked   Other Topics Concern  . None   Social History Narrative  . None     Family History:  The patient's family history includes Asthma in her brother; Breast cancer in her mother; CAD in her brother and father; Cancer in her brother and father; Heart disease in her father; Hypertension in her brother and father.   ROS:   Please see the history of present illness.    ROS All other systems reviewed and are negative.   PHYSICAL EXAM:   VS:  BP 122/78   Pulse 74   Ht 5\' 6"  (1.676 m)   Wt 287 lb 6.4 oz (130.4 kg)   BMI 46.39 kg/m    GEN: Well nourished, well developed, in no acute distress  HEENT: normal  Neck: no JVD, carotid bruits, or masses Cardiac: RRR; no murmurs, rubs, or gallops,no edema  Respiratory:  clear to auscultation bilaterally, normal work of breathing GI: soft, nontender, nondistended, + BS MS: no deformity or  atrophy  Skin: warm and dry, no rash Neuro:  Alert and Oriented x 3, Strength and sensation are intact Psych: euthymic mood, full affect  Wt Readings from Last 3 Encounters:  09/23/16 287 lb 6.4 oz (130.4 kg)  08/26/16 290 lb 3.2 oz (131.6 kg)  07/16/16 281 lb (127.5 kg)      Studies/Labs Reviewed:   EKG:  EKG is ordered today.  The ekg ordered today demonstrates Normal sinus rhythm with no significant ST-T wave changes, one PVC.  Recent Labs: 12/31/2015: BUN 23; Creat 0.77; Potassium 4.8; Sodium 139 08/26/2016: TSH 2.41   Lipid Panel    Component Value Date/Time   CHOL 144 01/03/2015 0838   TRIG 63.0  01/03/2015 0838   HDL 45.80 01/03/2015 0838   CHOLHDL 3 01/03/2015 0838   VLDL 12.6 01/03/2015 0838   LDLCALC 86 01/03/2015 0838    Additional studies/ records that were reviewed today include:   Coronary CT 01/16/2016 Dilated pulmonary artery measuring 36 x 31 mm suggestive of pulmonary hypertension. Normal pulmonary vein drainage into the left atrium. A large left atrial appendage without evidence of a thrombus. IMPRESSION: 1. Coronary calcium score of 0. This was 0 percentile for age and sex matched control. 2. Normal coronary origin with right dominance. 3. No evidence of CAD. 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 4. Dilated pulmonary artery suggestive of pulmonary hypertension.    Echo 02/06/2016 LV EF: 60% - 65%  - Left ventricle: The cavity size was normal. Wall thickness was increased in a pattern of moderate LVH. Systolic function was normal. The estimated ejection fraction was in the range of 60% to 65%. Wall motion was normal; there were no regional wall motion abnormalities. Left ventricular diastolic function parameters were normal. - Aortic valve: Trileaflet. There was no stenosis. There was trivial regurgitation. - Ascending aorta: The ascending aorta was dilated to 4 cm. - Mitral valve: Mildly thickened  leaflets . There was trivial regurgitation. - Left atrium: The atrium was normal in size. - Right ventricle: The cavity size was mildly dilated. Systolic function is reduced. - Right atrium: The atrium was mildly dilated. - Tricuspid valve: There was trivial regurgitation. - Pulmonary arteries: The main pulmonary artery was not well-visualized. PA peak pressure: 37 mm Hg (S). - Systemic veins: The IVC measures <2.1 cm, but does not collapse >50%, suggesting an elevated RA pressure of 8 mmHg.  Impressions:  - LVEF 60-65%, moderate LVH, normal wall motion, normal diastolic function, normal LA size, mildly dilated ascending aorta to 4.0 cm. Mild RAE and RVE with reduced RV systolic function, trivial TR, RVSP 37 mmHg, mildly elevated RA pressure of 8 mmHg.    24 hour monitor 09/07/2016  Quality: Fair.  Baseline artifact. Predominant rhythm: sinus rhythm Average heart rate: 66 bpm Max heart rate: 119 bpm Min heart rate: 45 bpm  Frequent PVCs (1.4%) Ventricular trigeminy noted Occasional PACs      ASSESSMENT:    1. Palpitations   2. Essential hypertension   3. Breast mass in female      PLAN:  In order of problems listed above:  1. Palpitation: Based on recent Holter monitor, it appears she has very frequent PVCs. We will switch her from lisinopril to low-dose metoprolol tartrate.  2. Hypertension: Currently well controlled on 10 mg daily of lisinopril, will switch to low-dose metoprolol 12.5 mg twice a day for rate control purposes and suppression of PVCs.  3. Breast mass: No malignancy on recent excisional biopsy    Medication Adjustments/Labs and Tests Ordered: Current medicines are reviewed at length with the patient today.  Concerns regarding medicines are outlined above.  Medication changes, Labs and Tests ordered today are listed in the Patient Instructions below. Patient Instructions  Medication Instructions:  1. STOP LISINOPRIL  2.  START METOPROLOL TARTRATE 12.5 MG TWICE DAILY (THIS WILL BE 1/2 TABLET OF THE 25 MG TABLET TWICE DAILY)  Labwork: NONE  Testing/Procedures: NONE  Follow-Up: DR. Oval Linsey ON 12/22/16 @ 8:15  Any Other Special Instructions Will Be Listed Below (If Applicable).     If you need a refill on your cardiac medications before your next appointment, please call your pharmacy.  Hilbert Corrigan, [place]  09/23/2016 9:24 AM    [address], [place], [place]  [address] Phone: [phone]; Fax: [phone]

## 2016-09-23 ENCOUNTER — Ambulatory Visit (INDEPENDENT_AMBULATORY_CARE_PROVIDER_SITE_OTHER): Payer: 59 | Admitting: Physician Assistant

## 2016-09-23 ENCOUNTER — Encounter: Payer: Self-pay | Admitting: Physician Assistant

## 2016-09-23 VITALS — BP 122/78 | HR 74 | Ht 66.0 in | Wt 287.4 lb

## 2016-09-23 DIAGNOSIS — N63 Unspecified lump in unspecified breast: Secondary | ICD-10-CM

## 2016-09-23 DIAGNOSIS — R002 Palpitations: Secondary | ICD-10-CM

## 2016-09-23 DIAGNOSIS — I10 Essential (primary) hypertension: Secondary | ICD-10-CM | POA: Diagnosis not present

## 2016-09-23 MED ORDER — METOPROLOL TARTRATE 25 MG PO TABS: 12.5000 mg | ORAL_TABLET | Freq: Two times a day (BID) | ORAL | 3 refills | Status: DC

## 2016-09-23 NOTE — Patient Instructions (Addendum)
Medication Instructions:  1. STOP LISINOPRIL  2. START METOPROLOL TARTRATE 12.5 MG TWICE DAILY (THIS WILL BE 1/2 TABLET OF THE 25 MG TABLET TWICE DAILY)  Labwork: NONE  Testing/Procedures: NONE  Follow-Up: DR. Oval Linsey ON 12/22/16 @ 8:15  Any Other Special Instructions Will Be Listed Below (If Applicable).     If you need a refill on your cardiac medications before your next appointment, please call your pharmacy.

## 2016-10-20 ENCOUNTER — Ambulatory Visit (INDEPENDENT_AMBULATORY_CARE_PROVIDER_SITE_OTHER): Payer: 59 | Admitting: Family Medicine

## 2016-10-20 ENCOUNTER — Other Ambulatory Visit: Payer: Self-pay | Admitting: Family Medicine

## 2016-10-20 ENCOUNTER — Encounter: Payer: Self-pay | Admitting: Family Medicine

## 2016-10-20 VITALS — BP 122/76 | HR 61 | Temp 98.2°F | Resp 17 | Ht 66.0 in | Wt 291.4 lb

## 2016-10-20 DIAGNOSIS — I10 Essential (primary) hypertension: Secondary | ICD-10-CM | POA: Diagnosis not present

## 2016-10-20 LAB — CBC WITH DIFFERENTIAL/PLATELET
Basophils Absolute: 0.1 10*3/uL (ref 0.0–0.1)
Basophils Relative: 0.8 % (ref 0.0–3.0)
Eosinophils Absolute: 0.3 10*3/uL (ref 0.0–0.7)
Eosinophils Relative: 5 % (ref 0.0–5.0)
HCT: 38.4 % (ref 36.0–46.0)
Hemoglobin: 12.8 g/dL (ref 12.0–15.0)
Lymphocytes Relative: 24.8 % (ref 12.0–46.0)
Lymphs Abs: 1.7 10*3/uL (ref 0.7–4.0)
MCHC: 33.3 g/dL (ref 30.0–36.0)
MCV: 90.5 fl (ref 78.0–100.0)
Monocytes Absolute: 0.5 10*3/uL (ref 0.1–1.0)
Monocytes Relative: 6.9 % (ref 3.0–12.0)
Neutro Abs: 4.2 10*3/uL (ref 1.4–7.7)
Neutrophils Relative %: 62.5 % (ref 43.0–77.0)
Platelets: 212 10*3/uL (ref 150.0–400.0)
RBC: 4.25 Mil/uL (ref 3.87–5.11)
RDW: 14.4 % (ref 11.5–15.5)
WBC: 6.7 10*3/uL (ref 4.0–10.5)

## 2016-10-20 LAB — HEPATIC FUNCTION PANEL
ALT: 47 U/L — ABNORMAL HIGH (ref 0–35)
AST: 37 U/L (ref 0–37)
Albumin: 4.1 g/dL (ref 3.5–5.2)
Alkaline Phosphatase: 77 U/L (ref 39–117)
Bilirubin, Direct: 0.1 mg/dL (ref 0.0–0.3)
Total Bilirubin: 0.4 mg/dL (ref 0.2–1.2)
Total Protein: 7.1 g/dL (ref 6.0–8.3)

## 2016-10-20 LAB — BASIC METABOLIC PANEL
BUN: 16 mg/dL (ref 6–23)
CO2: 31 mEq/L (ref 19–32)
Calcium: 9.3 mg/dL (ref 8.4–10.5)
Chloride: 104 mEq/L (ref 96–112)
Creatinine, Ser: 0.75 mg/dL (ref 0.40–1.20)
GFR: 83.63 mL/min (ref >60.00)
Glucose, Bld: 104 mg/dL — ABNORMAL HIGH (ref 70–99)
Potassium: 4.5 mEq/L (ref 3.5–5.1)
Sodium: 140 mEq/L (ref 135–145)

## 2016-10-20 LAB — LIPID PANEL
Cholesterol: 186 mg/dL (ref 0–200)
HDL: 59.2 mg/dL (ref >39.00)
LDL Cholesterol: 111 mg/dL — ABNORMAL HIGH (ref 0–99)
NonHDL: 126.77
Total CHOL/HDL Ratio: 3
Triglycerides: 81 mg/dL (ref 0.0–149.0)
VLDL: 16.2 mg/dL (ref 0.0–40.0)

## 2016-10-20 LAB — TSH: TSH: 2.35 u[IU]/mL (ref 0.35–4.50)

## 2016-10-20 NOTE — Progress Notes (Signed)
   Subjective:    Patient ID: Virginia Matthews, female    DOB: 1955-10-14, 61 y.o.   MRN: [id number]  HPI HTN- chronic problem.  Pt is currently on Lisinopril but was supposed to switch to Metoprolol per cardiology due to her palpitations.  BP is well controlled today.  Denies CP, SOB above baseline, HAs, visual changes, edema.  Obesity- pt has gained 4 lbs in the last month.  Pt is not exercising recently.  Has been traveling recently- not eating well.   Review of Systems For ROS see HPI     Objective:   Physical Exam  Constitutional: She is oriented to person, place, and time. She appears well-developed and well-nourished. No distress.  obese  HENT:  Head: Normocephalic and atraumatic.  Eyes: Conjunctivae and EOM are normal. Pupils are equal, round, and reactive to light.  Neck: Normal range of motion. Neck supple. No thyromegaly present.  Cardiovascular: Normal rate, regular rhythm, normal heart sounds and intact distal pulses.   No murmur heard. Pulmonary/Chest: Effort normal and breath sounds normal. No respiratory distress.  Abdominal: Soft. She exhibits no distension. There is no tenderness.  Musculoskeletal: She exhibits no edema.  Lymphadenopathy:    She has no cervical adenopathy.  Neurological: She is alert and oriented to person, place, and time.  Skin: Skin is warm and dry.  Psychiatric: She has a normal mood and affect. Her behavior is normal.  Vitals reviewed.         Assessment & Plan:

## 2016-10-20 NOTE — Assessment & Plan Note (Signed)
Chronic problem.  Well controlled today.  Asymptomatic.  Pt plans to switch to Metoprolol for the intermittent PVCs.  Check labs.  Will follow.

## 2016-10-20 NOTE — Progress Notes (Signed)
Pre visit review using our clinic review tool, if applicable. No additional management support is needed unless otherwise documented below in the visit note. 

## 2016-10-20 NOTE — Assessment & Plan Note (Signed)
Ongoing issue for pt.  She has gained some weight recently w/ her travel and poor eating habits.  Stressed need for healthy diet and regular exercise.  Check labs to risk stratify.  Will follow.

## 2016-10-20 NOTE — Patient Instructions (Signed)
Schedule your complete physical in 6 months We'll notify you of your lab results and make any changes if needed Continue to work on healthy diet and regular exercise- you can do it! Call with any questions or concerns Happy New Year!!! 

## 2016-10-21 ENCOUNTER — Other Ambulatory Visit: Payer: Self-pay | Admitting: Family Medicine

## 2016-10-21 DIAGNOSIS — R740 Nonspecific elevation of levels of transaminase and lactic acid dehydrogenase [LDH]: Principal | ICD-10-CM

## 2016-10-21 DIAGNOSIS — R7401 Elevation of levels of liver transaminase levels: Secondary | ICD-10-CM

## 2016-12-09 DIAGNOSIS — K210 Gastro-esophageal reflux disease with esophagitis: Secondary | ICD-10-CM | POA: Diagnosis not present

## 2016-12-09 DIAGNOSIS — M170 Bilateral primary osteoarthritis of knee: Secondary | ICD-10-CM | POA: Diagnosis not present

## 2016-12-09 DIAGNOSIS — Z6841 Body Mass Index (BMI) 40.0 and over, adult: Secondary | ICD-10-CM | POA: Diagnosis not present

## 2016-12-09 DIAGNOSIS — L409 Psoriasis, unspecified: Secondary | ICD-10-CM | POA: Diagnosis not present

## 2016-12-09 DIAGNOSIS — L4050 Arthropathic psoriasis, unspecified: Secondary | ICD-10-CM | POA: Diagnosis not present

## 2016-12-09 DIAGNOSIS — R768 Other specified abnormal immunological findings in serum: Secondary | ICD-10-CM | POA: Diagnosis not present

## 2016-12-09 DIAGNOSIS — Z79899 Other long term (current) drug therapy: Secondary | ICD-10-CM | POA: Diagnosis not present

## 2016-12-21 NOTE — Progress Notes (Signed)
Cardiology Office Note   Date:  12/22/2016   ID:  [name], DOB 1956-01-12, MRN [id number]  PCP:  Annye Asa, MD  Cardiologist:   Skeet Latch, MD   Chief Complaint  Patient presents with  . Palpitations     History of Present Illness: Virginia Matthews is a 61 y.o. female with hypertension and obesity who presents for follow up.  [name] initially presented 12/2015 with atypical chest pain that occurred in stressful situations.  She also has a family history of premature CAD.  Therefore she was referred for cardiac CT angiography and coronary calcium scoring.  Her coronary calcium score was 0 and there were no obstructive lesions.  It showed a dilated pulmonary artery suggestive of pulmonary hypertension and her ascending thoracic aortic was 4.2 cm.  She was referred for echocardiography that revealed PASP 37 mmHg and was otherwise unremarkable.   She followed up with Almyra Deforest, PA, on 07/2016 for palpitations.  She wore a 24-hour Holter 08/2016 that revealed frequent PVCs and occasional PACs.  She was started on metoprolol which has helped somewhat with the palpitations. She continues to have them almost daily, mostly when she is sitting down.  She's no longer bothered by them.  [name] has not been exercising. She denies chest pain or pressure but has exertional shortness of breath. She attributes this to being out of shape. She is unable to exercise due to arthritis. She also notes that she's been gaining weight due to her inactivity. She denies lower extremity edema, orthopnea, or PND.  She doesn't check her blood pressure at home regularly, but it is been in the 120s over 70s to 80s when checked in clinic appointments.   Past Medical History:  Diagnosis Date  . Breast mass, left 06/2016  . Carpal tunnel syndrome on both sides   . GERD (gastroesophageal reflux disease)   . Heart murmur   . Hypertension    states under control  with med., has been on med. x 3-4 yr.  . LVH (left ventricular hypertrophy)    moderate, per echo 01/2016  . Obesity   . PONV (postoperative nausea and vomiting)   . Psoriatic arthritis ([place])   . Rosacea   . Thoracic ascending aortic aneurysm [facility])     Past Surgical History:  Procedure Laterality Date  . COLONOSCOPY WITH PROPOFOL  09/12/2015  . LAPAROSCOPIC ENDOMETRIOSIS FULGURATION    . MICRODISCECTOMY LUMBAR Right 07/16/2011   L4-5  . PELVIC LAPAROSCOPY     DIAG LAP  . RADIOACTIVE SEED GUIDED EXCISIONAL BREAST BIOPSY Left 07/16/2016   Procedure: LEFT RADIOACTIVE SEED GUIDED EXCISIONAL BREAST BIOPSY;  Surgeon: Rolm Bookbinder, MD;  Location: [facility];  Service: General;  Laterality: Left;     Current Outpatient Prescriptions  Medication Sig Dispense Refill  . Adalimumab (HUMIRA PEN) 40 MG/0.8ML PNKT Inject 40 mg into the skin once a week. 4 each 6  . b complex vitamins tablet Take 1 tablet by mouth daily.    . calcipotriene-betamethasone (TACLONEX) ointment Apply topically daily.    Marland Kitchen escitalopram (LEXAPRO) 10 MG tablet TAKE 1 TABLET BY MOUTH ONCE DAILY. 90 tablet 1  . fluticasone (FLONASE) 50 MCG/ACT nasal spray Place 2 sprays into both nostrils daily. 16 g 3  . hydrocortisone valerate (WEST-CORT) 0.2 % ointment Apply 1 application topically daily as needed. As needed     . loratadine (CLARITIN) 10 MG tablet Take 10 mg by mouth daily.      Marland Kitchen  metoprolol tartrate (LOPRESSOR) 25 MG tablet Take 0.5 tablets (12.5 mg total) by mouth 2 (two) times daily. 180 tablet 3  . metroNIDAZOLE (METROCREAM) 0.75 % cream Apply topically 2 (two) times daily.    . Multiple Vitamins-Minerals (CENTRUM SILVER PO) Take 1 tablet by mouth daily. Use as directed     . Omega-3 Fatty Acids (FISH OIL PO) Take 1 capsule by mouth daily.    . pantoprazole (PROTONIX) 40 MG tablet TAKE 1 TABLET BY MOUTH DAILY. 90 tablet 3  . VITAMIN D, CHOLECALCIFEROL, PO Take 2,000 Units by mouth daily.       No current facility-administered medications for this visit.     Allergies:   Adhesive [tape]    Social History:  The patient  reports that she has never smoked. She has never used smokeless tobacco. She reports that she drinks alcohol. She reports that she does not use drugs.   Family History:  The patient's family history includes Asthma in her brother; Breast cancer in her mother; CAD in her brother and father; Cancer in her brother and father; Heart disease in her father; Hypertension in her brother and father.    ROS:  Please see the history of present illness.   Otherwise, review of systems are positive for none.   All other systems are reviewed and negative.    PHYSICAL EXAM: VS:  BP 136/84   Pulse 65   Ht 5\' 6"  (1.676 m)   Wt 134.4 kg (296 lb 3.2 oz)   BMI 47.81 kg/m  , BMI Body mass index is 47.81 kg/m. GENERAL:  Well appearing HEENT:  Pupils equal round and reactive, fundi not visualized, oral mucosa unremarkable NECK:  No jugular venous distention, waveform within normal limits, carotid upstroke brisk and symmetric, no carotid bruits LYMPHATICS:  No cervical adenopathy LUNGS:  Clear to auscultation bilaterally HEART:  RRR.  PMI not displaced or sustained,S1 and S2 within normal limits, no S3, no S4, no clicks, no rubs, II/VI murmurs ABD:  Flat, positive bowel sounds normal in frequency in pitch, no bruits, no rebound, no guarding, no midline pulsatile mass, no hepatomegaly, no splenomegaly EXT:  2 plus pulses throughout, no edema, no cyanosis no clubbing SKIN:  No rashes no nodules NEURO:  Cranial nerves II through XII grossly intact, motor grossly intact throughout PSYCH:  Cognitively intact, oriented to person, place and time  EKG:  EKG is not  ordered today.   Cardiac CT-A 01/16/16: Aortic Valve: Trileaflet, normal thickness, no calcifications. Coronary Arteries: Normal coronary origin. Right dominance. Left main is a large artery with no plaque. LAD is a  large caliber artery that gives rise to 3 small diagonal branches and wraps around the apex. There is no plaque. 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 LCX is a medium caliber vessel that gives rise to two small OM branches, there is no plaque. RCA is a large caliber that gives rise to PDA and PLA. There is no plaque. Other findings: Dilated pulmonary artery measuring 36 x 31 mm suggestive of pulmonary hypertension. Normal pulmonary vein drainage into the left atrium. A large left atrial appendage without evidence of a thrombus. IMPRESSION: 1. Coronary calcium score of 0. This was 0 percentile for age and sex matched control. 2. Normal coronary origin with right dominance. 3. No evidence of CAD. 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 4. Dilated pulmonary artery suggestive of pulmonary hypertension.  IMPRESSION: 1. Evidence of probable  air trapping in the visualize lung bases, suggesting small airways disease. 2. Ectasia of the ascending thoracic aorta (4.2 cm in diameter).   Echo 02/06/16:  Study Conclusions  - Left ventricle: The cavity size was normal. Wall thickness was  increased in a pattern of moderate LVH. Systolic function was  normal. The estimated ejection fraction was in the range of 60%  to 65%. Wall motion was normal; there were no regional wall  motion abnormalities. Left ventricular diastolic function  parameters were normal. - Aortic valve: Trileaflet. There was no stenosis. There was  trivial regurgitation. - Ascending aorta: The ascending aorta was dilated to 4 cm. - Mitral valve: Mildly thickened leaflets . There was trivial  regurgitation. - Left atrium: The atrium was normal in size. - Right ventricle: The cavity size was mildly dilated. Systolic  function is reduced. - Right atrium: The atrium was mildly dilated. - Tricuspid valve: There was trivial regurgitation. - Pulmonary arteries: The  main pulmonary artery was not  well-visualized. PA peak pressure: 37 mm Hg (S). - Systemic veins: The IVC measures <2.1 cm, but does not collapse  >50%, suggesting an elevated RA pressure of 8 mmHg.  Impressions:  - LVEF 60-65%, moderate LVH, normal wall motion, normal diastolic  function, normal LA size, mildly dilated ascending aorta to 4.0  cm. Mild RAE and RVE with reduced RV systolic function, trivial  TR, RVSP 37 mmHg, mildly elevated RA pressure of 8 mmHg.  24 Hour Holter Monitor 09/07/16:  Quality: Fair.  Baseline artifact. Predominant rhythm: sinus rhythm Average heart rate: 66 bpm Max heart rate: 119 bpm Min heart rate: 45 bpm  Frequent PVCs (1.4%) Ventricular trigeminy noted Occasional PACs   Recent Labs: 10/20/2016: ALT 47; BUN 16; Creatinine, Ser 0.75; Hemoglobin 12.8; Platelets 212.0; Potassium 4.5; Sodium 140; TSH 2.35    Lipid Panel    Component Value Date/Time   CHOL 186 10/20/2016 0851   TRIG 81.0 10/20/2016 0851   HDL 59.20 10/20/2016 0851   CHOLHDL 3 10/20/2016 0851   VLDL 16.2 10/20/2016 0851   LDLCALC 111 (H) 10/20/2016 0851      Wt Readings from Last 3 Encounters:  12/22/16 134.4 kg (296 lb 3.2 oz)  10/20/16 132.2 kg (291 lb 6 oz)  09/23/16 130.4 kg (287 lb 6.4 oz)      ASSESSMENT AND PLAN:  # Atypical chest pain: CT-A showed a myocardial bridge but no obstruction.  Her coronary calcium score was 0.  Her ASCVD 10 year risk is 3.4%.  Aspirin is not indicated.    # Hypertension: BP his elevated initially and on repeat. She reports this is been well controlled in clinic appointments.  We will not make any changes but have asked her to keep a log at home and report the findings to [place].  Her goal is less than 130/80.  # Obesity: [name] was encouraged to start back with her exercise routine.  Ideally, she would exercise at least 30-40 minutes most days of the week.   # Carotid bruit: No carotid stenosis on carotid Doppler.     # Ascending thoracic aortic aneurysm: She is asymptomatic.  Repeat echo 01/2017.  # Moderate LVH:  # RV dilation:  Noted on echo.  She has no heart failure symptoms.  BP control as above.  Repeat echo in 01/2017.  Current medicines are reviewed at length with the patient today.  The patient does not have concerns regarding medicines.  The following changes have been made:  no change  Labs/ tests ordered today include:   Orders Placed This Encounter  Procedures  . ECHOCARDIOGRAM COMPLETE     Disposition:   FU with Lenny Bouchillon C. Oval Linsey, MD, [facility] in 1 year    This note was written with the assistance of speech recognition software.  Please excuse any transcriptional errors.  Signed, Naveyah Iacovelli C. Oval Linsey, MD, [facility]  12/22/2016 8:51 PM    [facility]

## 2016-12-22 ENCOUNTER — Ambulatory Visit (INDEPENDENT_AMBULATORY_CARE_PROVIDER_SITE_OTHER): Payer: 59 | Admitting: Cardiovascular Disease

## 2016-12-22 VITALS — BP 136/84 | HR 65 | Ht 66.0 in | Wt 296.2 lb

## 2016-12-22 DIAGNOSIS — I712 Thoracic aortic aneurysm, without rupture: Secondary | ICD-10-CM | POA: Diagnosis not present

## 2016-12-22 DIAGNOSIS — I10 Essential (primary) hypertension: Secondary | ICD-10-CM

## 2016-12-22 DIAGNOSIS — I7121 Aneurysm of the ascending aorta, without rupture: Secondary | ICD-10-CM

## 2016-12-22 NOTE — Patient Instructions (Addendum)
Medication Instructions:  Your physician recommends that you continue on your current medications as directed. Please refer to the Current Medication list given to you today.  Labwork: NONE  Testing/Procedures: Your physician has requested that you have an echocardiogram. Echocardiography is a painless test that uses sound waves to create images of your heart. It provides your doctor with information about the size and shape of your heart and how well your heart's chambers and valves are working. This procedure takes approximately one hour. There are no restrictions for this procedure. [facility] [place] [address]  IN MAY   Follow-Up: Your physician wants you to follow-up in: [place] will receive a reminder letter in the mail two months in advance. If you don't receive a letter, please call our office to schedule the follow-up appointment.  Any Other Special Instructions Will Be Listed Below (If Applicable). MONITOR YOUR BLOOD PRESSURE AND CALL WITH UPDATED READINGS   If you need a refill on your cardiac medications before your next appointment, please call your pharmacy.

## 2017-01-19 ENCOUNTER — Encounter: Payer: Self-pay | Admitting: Sports Medicine

## 2017-01-19 ENCOUNTER — Ambulatory Visit (INDEPENDENT_AMBULATORY_CARE_PROVIDER_SITE_OTHER): Payer: 59 | Admitting: Sports Medicine

## 2017-01-19 VITALS — BP 118/99 | Ht 66.0 in | Wt 285.0 lb

## 2017-01-19 DIAGNOSIS — M1711 Unilateral primary osteoarthritis, right knee: Secondary | ICD-10-CM

## 2017-01-19 MED ORDER — DICLOFENAC SODIUM 1 % TD GEL: 2.0000 g | Freq: Three times a day (TID) | TRANSDERMAL | 3 refills | Status: DC

## 2017-01-19 NOTE — Progress Notes (Signed)
   Subjective:    Patient ID: Virginia Matthews, female    DOB: [birth year], 61 y.o.   MRN: [id number]  HPI chief complaint: Right knee pain  Very pleasant 61 year old female comes in today complaining of 2 months of right knee pain. No injury that she can recall but gradual onset of pain is along the anterior medial knee. It is most noticeable with going down stairs or coming down hill. She denies any swelling. No locking or catching. She was taking some diclofenac but had to stop it because of stomach upset. She has also tried naproxen sodium and Advil but neither have been helpful. Tylenol has also not been helpful. 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 She has been doing some chair squats. These do not seem to cause her pain. She's had problems with her right knee in the past. X-rays in 2015 showed advanced medial compartmental DJD.  Interim medical history reviewed Medications are reviewed Allergies reviewed    Review of Systems    as above Objective:   Physical Exam  Well-developed, obese. No acute distress. Awake alert and oriented 3. Vital signs reviewed  Right knee: Full range of motion. No obvious effusion. Trace patellofemoral crepitus. She is tender to palpation along the medial joint line. No tenderness laterally. Good joint stability. Neurovascularly intact distally.  Gait analysis shows her to supinate severely. No limp.  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 We will try Voltaren gel. We discussed the possibility of a cortisone injection if symptoms persist or worsen. She will start daily isometric quad strengthening exercises and will follow-up in a week or two for custom orthotics to help correct  her supination. She will also ice her knees after activity.

## 2017-02-01 ENCOUNTER — Other Ambulatory Visit: Payer: Self-pay | Admitting: Pharmacist

## 2017-02-01 MED ORDER — ADALIMUMAB 40 MG/0.8ML ~~LOC~~ AJKT: 40.0000 mg | AUTO-INJECTOR | SUBCUTANEOUS | 5 refills | Status: DC

## 2017-02-09 ENCOUNTER — Ambulatory Visit (INDEPENDENT_AMBULATORY_CARE_PROVIDER_SITE_OTHER): Payer: 59 | Admitting: Sports Medicine

## 2017-02-09 VITALS — BP 142/84 | Ht 66.0 in | Wt 285.0 lb

## 2017-02-09 DIAGNOSIS — R269 Unspecified abnormalities of gait and mobility: Secondary | ICD-10-CM | POA: Diagnosis not present

## 2017-02-09 NOTE — Progress Notes (Signed)
  Patient comes in today for custom orthotics. Please see my last office note for details regarding history and physical exam findings on this patient. In short, she has a history of knee DJD. Gait analysis at her last office visit showed severe supination with walking.  Custom orthotics were constructed today. Lateral heel wedges were added to help correct her supination. Gait was neutral after orthotic fitting. She found them to be comfortable. A total of 30 minutes was spent with the patient with greater than 50% of the time spent in face-to-face consultation discussing orthotic construction, instruction, and fitting. Patient will follow-up with me in 4 weeks for reevaluation. If she finds these current orthotics to be comfortable then we could try adding lateral heel wedges to some of her other shoes which may not accommodate her custom orthotic.  Patient was fitted for a : standard, cushioned, semi-rigid orthotic. The orthotic was heated and afterward the patient stood on the orthotic blank positioned on the orthotic stand. The patient was positioned in subtalar neutral position and 10 degrees of ankle dorsiflexion in a weight bearing stance. After completion of molding, a stable base was applied to the orthotic blank. The blank was ground to a stable position for weight bearing. Size: 10 Base: Blue EVA Posting: none Additional orthotic padding: B/L lateral heel wedges

## 2017-02-10 ENCOUNTER — Other Ambulatory Visit: Payer: Self-pay

## 2017-02-10 ENCOUNTER — Ambulatory Visit (HOSPITAL_COMMUNITY): Payer: 59 | Attending: Cardiovascular Disease

## 2017-02-10 DIAGNOSIS — I712 Thoracic aortic aneurysm, without rupture: Secondary | ICD-10-CM | POA: Diagnosis not present

## 2017-02-10 DIAGNOSIS — I7121 Aneurysm of the ascending aorta, without rupture: Secondary | ICD-10-CM

## 2017-02-10 DIAGNOSIS — I351 Nonrheumatic aortic (valve) insufficiency: Secondary | ICD-10-CM | POA: Diagnosis not present

## 2017-02-19 ENCOUNTER — Other Ambulatory Visit: Payer: Self-pay | Admitting: Family Medicine

## 2017-02-26 ENCOUNTER — Other Ambulatory Visit: Payer: Self-pay | Admitting: Family Medicine

## 2017-03-15 ENCOUNTER — Ambulatory Visit: Payer: 59 | Admitting: Sports Medicine

## 2017-04-13 ENCOUNTER — Ambulatory Visit (INDEPENDENT_AMBULATORY_CARE_PROVIDER_SITE_OTHER): Payer: 59 | Admitting: Sports Medicine

## 2017-04-13 VITALS — BP 140/94 | Ht 66.0 in | Wt 285.0 lb

## 2017-04-13 DIAGNOSIS — M1710 Unilateral primary osteoarthritis, unspecified knee: Secondary | ICD-10-CM | POA: Insufficient documentation

## 2017-04-13 DIAGNOSIS — M170 Bilateral primary osteoarthritis of knee: Secondary | ICD-10-CM | POA: Insufficient documentation

## 2017-04-13 DIAGNOSIS — M1712 Unilateral primary osteoarthritis, left knee: Secondary | ICD-10-CM | POA: Insufficient documentation

## 2017-04-13 DIAGNOSIS — M1711 Unilateral primary osteoarthritis, right knee: Secondary | ICD-10-CM

## 2017-04-13 NOTE — Assessment & Plan Note (Signed)
>>  ASSESSMENT AND PLAN FOR PRIMARY OSTEOARTHRITIS OF RIGHT KNEE WRITTEN ON 04/13/2017 11:47 AM BY MOLT, BETHANY, DO  As documented on X-ray 09/2013 which showed advanced tricompartmental disease, particularly of the medial and patellofemoral compartments. Suspect disease progression in the past 3.5 years given her other comorbidities including morbid obesity. She found no relief from tylenol, ibuprofen, naproxen or topical diclofenac. Minimal improvement with oral diclofenac however takes sparingly 2/2 GI upset (on protonix). Mostly compliant with isometric quad strengthening exercises.  She has been mostly compliant with her orthotics for prominent supination. Not interested in lateral wedges for her sandals. Her sandals appear to have good support at this time. Offered steroid injection and PT however patient not interested at this time. Discussed long-term

## 2017-04-13 NOTE — Progress Notes (Signed)
   Subjective:    Patient ID: Virginia Matthews, female    DOB: 09/21/1956, 61 y.o.   MRN: [id number] CC: Right knee pain  HPI Very pleasant 61 year old F here with 3 month hx of anteromedial right knee pain, most pronounced with going down stairs or coming down-hill. She has been taking PO diclofenac with minimal improvement however takes rarely 2/2 GI upset. Tylenol, ibuprofen and naproxen not helpful. She works with a Insurance underwriter and performs several modified exercises to strengthen her quads. X-rays of right knee from 2015 with advanced tricompartmental disease, particularly in the medial and patellofemoral compartments.  At her initial visit she was noted to have prominent supination and was subsequently fitted with orthotics. She presents now for follow-up. Hasn't noticed significant improvement in her pain however feels improvement in her gait. She has been intermittently wearing orthotics and cites summer/wearing sandals as her main inhibiter. She reportedly wears sandals with good support in which she feels comfortable. Currently not interested in injection. Has been mostly compliant with isometric quad strengthening.   Interim medical history reviewed Medications are reviewed Allergies reviewed  Review of Systems As above.    Objective:   Physical Exam  Vitals:   04/13/17 1035  BP: (!) 140/94  Vital signs reviewed.  General: Pleasant and conversant. Morbidly obese female in no acute distress.  Head: Normocephalic, atraumatic.  Respiratory status: Normal WOB on RA. MSK Right knee: Inspection without obvious erythema or effusion. She has trace crepitus with movement. Good ROM and good joint stability. TTP medial joint line.  Gait: Mild supination, improved. No limp. Good stride.   X-rays reviewed, 10/13/2013: As above.     Assessment & Plan:   Please see problem based charting for details of assessment & plan.

## 2017-04-13 NOTE — Assessment & Plan Note (Signed)
Patient endorses recent weight gain. 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 Performs mostly weight-centered exercises.  -Encouraged continued physical activity -Encouraged weight loss

## 2017-04-13 NOTE — Assessment & Plan Note (Signed)
As documented on X-ray 09/2013 which showed advanced tricompartmental disease, particularly of the medial and patellofemoral compartments. Suspect disease progression in the past 3.5 years given her other comorbidities including morbid obesity. She found no relief from tylenol, ibuprofen, naproxen or topical diclofenac. Minimal improvement with oral diclofenac however takes sparingly 2/2 GI upset (on protonix). Mostly compliant with isometric quad strengthening exercises.  She has been mostly compliant with her orthotics for prominent supination. Not interested in lateral wedges for her sandals. Her sandals appear to have good support at this time. Offered steroid injection and PT however patient not interested at this time. Discussed long-term

## 2017-04-20 ENCOUNTER — Encounter: Payer: Self-pay | Admitting: Family Medicine

## 2017-04-20 ENCOUNTER — Ambulatory Visit (INDEPENDENT_AMBULATORY_CARE_PROVIDER_SITE_OTHER): Payer: 59 | Admitting: Family Medicine

## 2017-04-20 VITALS — BP 130/82 | HR 62 | Temp 98.1°F | Resp 16 | Ht 66.0 in | Wt 299.0 lb

## 2017-04-20 DIAGNOSIS — Z124 Encounter for screening for malignant neoplasm of cervix: Secondary | ICD-10-CM | POA: Diagnosis not present

## 2017-04-20 DIAGNOSIS — Z23 Encounter for immunization: Secondary | ICD-10-CM | POA: Diagnosis not present

## 2017-04-20 DIAGNOSIS — Z0000 Encounter for general adult medical examination without abnormal findings: Secondary | ICD-10-CM

## 2017-04-20 LAB — LIPID PANEL
Cholesterol: 157 mg/dL (ref 0–200)
HDL: 50.8 mg/dL (ref >39.00)
LDL Cholesterol: 90 mg/dL (ref 0–99)
NonHDL: 106.55
Total CHOL/HDL Ratio: 3
Triglycerides: 81 mg/dL (ref 0.0–149.0)
VLDL: 16.2 mg/dL (ref 0.0–40.0)

## 2017-04-20 LAB — CBC WITH DIFFERENTIAL/PLATELET
Basophils Absolute: 0.1 10*3/uL (ref 0.0–0.1)
Basophils Relative: 1 % (ref 0.0–3.0)
Eosinophils Absolute: 0.3 10*3/uL (ref 0.0–0.7)
Eosinophils Relative: 3.8 % (ref 0.0–5.0)
HCT: 38.5 % (ref 36.0–46.0)
Hemoglobin: 12.7 g/dL (ref 12.0–15.0)
Lymphocytes Relative: 22.6 % (ref 12.0–46.0)
Lymphs Abs: 1.8 10*3/uL (ref 0.7–4.0)
MCHC: 33 g/dL (ref 30.0–36.0)
MCV: 91.4 fl (ref 78.0–100.0)
Monocytes Absolute: 0.5 10*3/uL (ref 0.1–1.0)
Monocytes Relative: 6.7 % (ref 3.0–12.0)
Neutro Abs: 5.2 10*3/uL (ref 1.4–7.7)
Neutrophils Relative %: 65.9 % (ref 43.0–77.0)
Platelets: 245 10*3/uL (ref 150.0–400.0)
RBC: 4.21 Mil/uL (ref 3.87–5.11)
RDW: 13.8 % (ref 11.5–15.5)
WBC: 7.9 10*3/uL (ref 4.0–10.5)

## 2017-04-20 LAB — HEPATIC FUNCTION PANEL
ALT: 20 U/L (ref 0–35)
AST: 22 U/L (ref 0–37)
Albumin: 4.2 g/dL (ref 3.5–5.2)
Alkaline Phosphatase: 68 U/L (ref 39–117)
Bilirubin, Direct: 0.1 mg/dL (ref 0.0–0.3)
Total Bilirubin: 0.6 mg/dL (ref 0.2–1.2)
Total Protein: 7.1 g/dL (ref 6.0–8.3)

## 2017-04-20 LAB — BASIC METABOLIC PANEL
BUN: 17 mg/dL (ref 6–23)
CO2: 30 mEq/L (ref 19–32)
Calcium: 9.7 mg/dL (ref 8.4–10.5)
Chloride: 104 mEq/L (ref 96–112)
Creatinine, Ser: 0.82 mg/dL (ref 0.40–1.20)
GFR: 75.32 mL/min (ref >60.00)
Glucose, Bld: 100 mg/dL — ABNORMAL HIGH (ref 70–99)
Potassium: 4.2 mEq/L (ref 3.5–5.1)
Sodium: 140 mEq/L (ref 135–145)

## 2017-04-20 LAB — TSH: TSH: 2.26 u[IU]/mL (ref 0.35–4.50)

## 2017-04-20 LAB — VITAMIN D 25 HYDROXY (VIT D DEFICIENCY, FRACTURES): VITD: 40.05 ng/mL (ref 30.00–100.00)

## 2017-04-20 LAB — HEMOGLOBIN A1C: Hgb A1c MFr Bld: 5.4 % (ref 4.6–6.5)

## 2017-04-20 NOTE — Patient Instructions (Signed)
Follow up in 6 months to recheck weight loss progress and BP We'll notify you of your lab results and make any changes if needed Continue to work on healthy diet and regular exercise- you can do it! We'll call you with your GYN appointment for the pap You are due for mammo next month Call with any questions or concerns Happy [place] Birthday!!!

## 2017-04-20 NOTE — Progress Notes (Signed)
Pre visit review using our clinic review tool, if applicable. No additional management support is needed unless otherwise documented below in the visit note. 

## 2017-04-20 NOTE — Addendum Note (Signed)
Addended by: Davis Gourd on: 04/20/2017 08:46 AM   Modules accepted: Orders

## 2017-04-20 NOTE — Assessment & Plan Note (Signed)
Pt has gained 14 lbs.  Again stressed the need for healthy diet and regular exercise.  Check labs to risk stratify.  Will follow.

## 2017-04-20 NOTE — Progress Notes (Signed)
   Subjective:    Patient ID: Virginia Matthews, female    DOB: 13-Jun-1956, 61 y.o.   MRN: [id number]  HPI CPE- UTD on colonoscopy, due for mammo next month.  Overdue for pap.  Due for Tdap.  Pt has gained 14 lbs since last visit.   Review of Systems Patient reports no vision/ hearing changes, adenopathy,fever, persistant/recurrent hoarseness , swallowing issues, chest pain, palpitations, edema, persistant/recurrent cough, hemoptysis, dyspnea (rest/exertional/paroxysmal nocturnal), gastrointestinal bleeding (melena, rectal bleeding), abdominal pain, significant heartburn, bowel changes, GU symptoms (dysuria, hematuria, incontinence), Gyn symptoms (abnormal  bleeding, pain),  syncope, focal weakness, memory loss, numbness & tingling, skin/hair/nail changes, abnormal bruising or bleeding, anxiety, or depression.     Objective:   Physical Exam General Appearance:    Alert, cooperative, no distress, appears stated age, obese  Head:    Normocephalic, without obvious abnormality, atraumatic  Eyes:    PERRL, conjunctiva/corneas clear, EOM's intact, fundi    benign, both eyes  Ears:    Normal TM's and external ear canals, both ears  Nose:   Nares normal, septum midline, mucosa normal, no drainage    or sinus tenderness  Throat:   Lips, mucosa, and tongue normal; teeth and gums normal  Neck:   Supple, symmetrical, trachea midline, no adenopathy;    Thyroid: no enlargement/tenderness/nodules  Back:     Symmetric, no curvature, ROM normal, no CVA tenderness  Lungs:     Clear to auscultation bilaterally, respirations unlabored  Chest Wall:    No tenderness or deformity   Heart:    Regular rate and rhythm, S1 and S2 normal, no murmur, rub   or gallop  Breast Exam:    Deferred to GYN  Abdomen:     Soft, non-tender, bowel sounds active all four quadrants,    no masses, no organomegaly  Genitalia:    Deferred to GYN  Rectal:    Extremities:   Extremities normal, atraumatic, no cyanosis or edema   Pulses:   2+ and symmetric all extremities  Skin:   Skin color, texture, turgor normal, no rashes or lesions  Lymph nodes:   Cervical, supraclavicular, and axillary nodes normal  Neurologic:   CNII-XII intact, normal strength, sensation and reflexes    throughout          Assessment & Plan:

## 2017-04-20 NOTE — Assessment & Plan Note (Signed)
Pt's PE WNL w/ exception of morbid obesity.  UTD on mammo (due next month) and colonoscopy.  Due for pap- referral placed.  Tdap given.  Check labs.  Anticipatory guidance provided.

## 2017-04-21 ENCOUNTER — Encounter: Payer: Self-pay | Admitting: General Practice

## 2017-05-11 ENCOUNTER — Ambulatory Visit (INDEPENDENT_AMBULATORY_CARE_PROVIDER_SITE_OTHER): Payer: 59 | Admitting: Obstetrics & Gynecology

## 2017-05-11 ENCOUNTER — Encounter: Payer: Self-pay | Admitting: Obstetrics & Gynecology

## 2017-05-11 VITALS — BP 134/86 | Ht 65.0 in | Wt 299.0 lb

## 2017-05-11 DIAGNOSIS — E6609 Other obesity due to excess calories: Secondary | ICD-10-CM

## 2017-05-11 DIAGNOSIS — Z01419 Encounter for gynecological examination (general) (routine) without abnormal findings: Secondary | ICD-10-CM

## 2017-05-11 DIAGNOSIS — Z6841 Body Mass Index (BMI) 40.0 and over, adult: Secondary | ICD-10-CM

## 2017-05-11 DIAGNOSIS — Z124 Encounter for screening for malignant neoplasm of cervix: Secondary | ICD-10-CM

## 2017-05-11 DIAGNOSIS — Z780 Asymptomatic menopausal state: Secondary | ICD-10-CM

## 2017-05-11 DIAGNOSIS — Z1151 Encounter for screening for human papillomavirus (HPV): Secondary | ICD-10-CM

## 2017-05-11 DIAGNOSIS — IMO0001 Reserved for inherently not codable concepts without codable children: Secondary | ICD-10-CM

## 2017-05-11 NOTE — Progress Notes (Signed)
Virginia Matthews [facility] [birth year] [id number]   History:    61 y.o.  Married.  Son is 66 yo in college.  RP:  New patient presenting for annual gyn exam   HPI:  Menopause.  No HRT.  No PMB.  No pelvic pain.  Not sexually active because husband has medical issues.  Breasts wnl.  Mictions normal.  BMs wnl.  Obesity with BMI 49.8.  Had lost weight, but gained back with Dx of Arthritis which is now better controled on Humira.  Will resume regular physical activity and low calorie/low carb diet.  Past medical history,surgical history, family history and social history were all reviewed and documented in the EPIC chart.  Gynecologic History No LMP recorded. Patient is postmenopausal. Contraception: abstinence and post menopausal status Last Pap: Many years ago. Results were: normal Last mammogram: 04/2016. Results were: normal  Obstetric History OB History  Gravida Para Term Preterm AB Living  3 1     1 1   SAB TAB Ectopic Multiple Live Births               # Outcome Date GA Lbr Len/2nd Weight Sex Delivery Anes PTL Lv  3 Gravida           2 AB           1 Para             Obstetric Comments  1. Stillbirth      ROS: A ROS was performed and pertinent positives and negatives are included in the history.  GENERAL: No fevers or chills. HEENT: No change in vision, no earache, sore throat or sinus congestion. NECK: No pain or stiffness. CARDIOVASCULAR: No chest pain or pressure. No palpitations. PULMONARY: No shortness of breath, cough or wheeze. GASTROINTESTINAL: No abdominal pain, nausea, vomiting or diarrhea, melena or bright red blood per rectum. GENITOURINARY: No urinary frequency, urgency, hesitancy or dysuria. MUSCULOSKELETAL: No joint or muscle pain, no back pain, no recent trauma. DERMATOLOGIC: No rash, no itching, no lesions. ENDOCRINE: No polyuria, polydipsia, no heat or cold intolerance. No recent change in weight. HEMATOLOGICAL: No anemia or easy bruising or bleeding. NEUROLOGIC:  No headache, seizures, numbness, tingling or weakness. PSYCHIATRIC: No depression, no loss of interest in normal activity or change in sleep pattern.     Exam:   BP 134/86   Ht 5\' 5"  (1.651 m)   Wt 299 lb (135.6 kg)   BMI 49.76 kg/m   Body mass index is 49.76 kg/m.  General appearance : Well developed well nourished female. No acute distress HEENT: Eyes: no retinal hemorrhage or exudates,  Neck supple, trachea midline, no carotid bruits, no thyroidmegaly Lungs: Clear to auscultation, no rhonchi or wheezes, or rib retractions  Heart: Regular rate and rhythm, no murmurs or gallops Breast:Examined in sitting and supine position were symmetrical in appearance, no palpable masses or tenderness,  no skin retraction, no nipple inversion, no nipple discharge, no skin discoloration, no axillary or supraclavicular lymphadenopathy Abdomen: no palpable masses or tenderness, no rebound or guarding Extremities: no edema or skin discoloration or tenderness  Pelvic:  Bartholin, Urethra, Skene Glands: Within normal limits             Vagina: No gross lesions or discharge  Cervix: No gross lesions or discharge.  Pap/HPV HR done.  Uterus  AV, normal size, shape and consistency, non-tender and mobile  Adnexa  Without masses or tenderness  Anus and perineum  normal   Assessment/Plan:  61  y.o. female for annual exam   1. Encounter for routine gynecological examination with Papanicolaou smear of cervix Normal gyn exam.  Pap/HPV done.  Breasts wnl.  Will schedule screening Mammo at the [place].  2. Menopause present No HRT.  No PMB.  Vit D supplements recommended.  Ca++ in nutrition.  Weight bearing physical activity.  Recommend Bone Density if not done x >2 years.  3. Class 3 obesity due to excess calories with body mass index (BMI) of 45.0 to 49.9 in adult (HCC) Low Calorie/low Carb diet.  Portion control.  Regular physical activity.  4. Cervical cancer screening  - PAP,TP IMGw/HPV  RNA,rflx [id number],58/34  5. Screening for human papillomavirus  - PAP,TP IMGw/HPV RNA,rflx [id number],27/12  Princess Bruins MD, 3:59 PM 05/11/2017

## 2017-05-11 NOTE — Patient Instructions (Signed)
1. Encounter for routine gynecological examination with Papanicolaou smear of cervix Normal gyn exam.  Pap/HPV done.  Breasts wnl.  Will schedule screening Mammo at the [place].  2. Menopause present No HRT.  No PMB.  Vit D supplements recommended.  Ca++ in nutrition.  Weight bearing physical activity.  Recommend Bone Density if not done x >2 years.  3. Class 3 obesity due to excess calories with body mass index (BMI) of 45.0 to 49.9 in adult (HCC) Low Calorie/low Carb diet.  Portion control.  Regular physical activity.  4. Cervical cancer screening  - PAP,TP IMGw/HPV RNA,rflx [id number],10/17  5. Screening for human papillomavirus  - PAP,TP IMGw/HPV RNA,rflx [id number],78/24  Virginia Matthews, it was a pleasure to meet you today!  I will inform you of your results as soon as available.   Exercising to Lose Weight Exercising can help you to lose weight. In order to lose weight through exercise, you need to do vigorous-intensity exercise. You can tell that you are exercising with vigorous intensity if you are breathing very hard and fast and cannot hold a conversation while exercising. Moderate-intensity exercise helps to maintain your current weight. You can tell that you are exercising at a moderate level if you have a higher heart rate and faster breathing, but you are still able to hold a conversation. How often should I exercise? Choose an activity that you enjoy and set realistic goals. Your health care provider can help you to make an activity plan that works for you. Exercise regularly as directed by your health care provider. This may include:  Doing resistance training twice each week, such as: ? Push-ups. ? Sit-ups. ? Lifting weights. ? Using resistance bands.  Doing a given intensity of exercise for a given amount of time. Choose from these options: ? 150 minutes of moderate-intensity exercise every week. ? 75 minutes of vigorous-intensity exercise every week. ? A mix of  moderate-intensity and vigorous-intensity exercise every week.  Children, pregnant women, people who are out of shape, people who are overweight, and older adults may need to consult a health care provider for individual recommendations. If you have any sort of medical condition, be sure to consult your health care provider before starting a new exercise program. What are some activities that can help me to lose weight?  Walking at a rate of at least 4.5 miles an hour.  Jogging or running at a rate of 5 miles per hour.  Biking at a rate of at least 10 miles per hour.  Lap swimming.  Roller-skating or in-line skating.  Cross-country skiing.  Vigorous competitive sports, such as football, basketball, and soccer.  Jumping rope.  Aerobic dancing. How can I be more active in my day-to-day activities?  Use the stairs instead of the elevator.  Take a walk during your lunch break.  If you drive, park your car farther away from work or school.  If you take public transportation, get off one stop early and walk the rest of the way.  Make all of your phone calls while standing up and walking around.  Get up, stretch, and walk around every 30 minutes throughout the day. What guidelines should I follow while exercising?  Do not exercise so much that you hurt yourself, feel dizzy, or get very short of breath.  Consult your health care provider prior to starting a new exercise program.  Wear comfortable clothes and shoes with good support.  Drink plenty of water while you exercise to prevent dehydration  or heat stroke. Body water is lost during exercise and must be replaced.  Work out until you breathe faster and your heart beats faster. This information is not intended to replace advice given to you by your health care provider. Make sure you discuss any questions you have with your health care provider. Document Released: 10/17/2010 Document Revised: 02/20/2016 Document Reviewed:  02/15/2014 Elsevier Interactive Patient Education  2018 [place] for [employer] Calories are units of energy. Your body needs a certain amount of calories from food to keep you going throughout the day. When you eat more calories than your body needs, your body stores the extra calories as fat. When you eat fewer calories than your body needs, your body burns fat to get the energy it needs. Calorie counting means keeping track of how many calories you eat and drink each day. Calorie counting can be helpful if you need to lose weight. If you make sure to eat fewer calories than your body needs, you should lose weight. Ask your health care provider what a healthy weight is for you. For calorie counting to work, you will need to eat the right number of calories in a day in order to lose a healthy amount of weight per week. A dietitian can help you determine how many calories you need in a day and will give you suggestions on how to reach your calorie goal.  A healthy amount of weight to lose per week is usually 1-2 lb (0.5-0.9 kg). This usually means that your daily calorie intake should be reduced by 500-750 calories.  Eating 1,200 - 1,500 calories per day can help most women lose weight.  Eating 1,500 - 1,800 calories per day can help most men lose weight.  What is my plan? My goal is to have __________ calories per day. If I have this many calories per day, I should lose around __________ pounds per week. What do I need to know about calorie counting? In order to meet your daily calorie goal, you will need to:  Find out how many calories are in each food you would like to eat. Try to do this before you eat.  Decide how much of the food you plan to eat.  Write down what you ate and how many calories it had. Doing this is called keeping a food log.  To successfully lose weight, it is important to balance calorie counting with a healthy lifestyle that includes regular  activity. Aim for 150 minutes of moderate exercise (such as walking) or 75 minutes of vigorous exercise (such as running) each week. Where do I find calorie information?  The number of calories in a food can be found on a Nutrition Facts label. If a food does not have a Nutrition Facts label, try to look up the calories online or ask your dietitian for help. Remember that calories are listed per serving. If you choose to have more than one serving of a food, you will have to multiply the calories per serving by the amount of servings you plan to eat. For example, the label on a package of bread might say that a serving size is 1 slice and that there are 90 calories in a serving. If you eat 1 slice, you will have eaten 90 calories. If you eat 2 slices, you will have eaten 180 calories. How do I keep a food log? Immediately after each meal, record the following information in your food log:  What you ate. Don't forget to include toppings, sauces, and other extras on the food.  How much you ate. This can be measured in cups, ounces, or number of items.  How many calories each food and drink had.  The total number of calories in the meal.  Keep your food log near you, such as in a small notebook in your pocket, or use a mobile app or website. Some programs will calculate calories for you and show you how many calories you have left for the day to meet your goal. What are some calorie counting tips?  Use your calories on foods and drinks that will fill you up and not leave you hungry: ? Some examples of foods that fill you up are nuts and nut butters, vegetables, lean proteins, and high-fiber foods like whole grains. High-fiber foods are foods with more than 5 g fiber per serving. ? Drinks such as sodas, specialty coffee drinks, alcohol, and juices have a lot of calories, yet do not fill you up.  Eat nutritious foods and avoid empty calories. Empty calories are calories you get from foods or  beverages that do not have many vitamins or protein, such as candy, sweets, and soda. It is better to have a nutritious high-calorie food (such as an avocado) than a food with few nutrients (such as a bag of chips).  Know how many calories are in the foods you eat most often. This will help you calculate calorie counts faster.  Pay attention to calories in drinks. Low-calorie drinks include water and unsweetened drinks.  Pay attention to nutrition labels for "low fat" or "fat free" foods. These foods sometimes have the same amount of calories or more calories than the full fat versions. They also often have added sugar, starch, or salt, to make up for flavor that was removed with the fat.  Find a way of tracking calories that works for you. Get creative. Try different apps or programs if writing down calories does not work for you. What are some portion control tips?  Know how many calories are in a serving. This will help you know how many servings of a certain food you can have.  Use a measuring cup to measure serving sizes. You could also try weighing out portions on a kitchen scale. With time, you will be able to estimate serving sizes for some foods.  Take some time to put servings of different foods on your favorite plates, bowls, and cups so you know what a serving looks like.  Try not to eat straight from a bag or box. Doing this can lead to overeating. Put the amount you would like to eat in a cup or on a plate to make sure you are eating the right portion.  Use smaller plates, glasses, and bowls to prevent overeating.  Try not to multitask (for example, watch TV or use your computer) while eating. If it is time to eat, sit down at a table and enjoy your food. This will help you to know when you are full. It will also help you to be aware of what you are eating and how much you are eating. What are tips for following this plan? Reading food labels  Check the calorie count compared  to the serving size. The serving size may be smaller than what you are used to eating.  Check the source of the calories. Make sure the food you are eating is high in vitamins and protein and low  in saturated and trans fats. Shopping  Read nutrition labels while you shop. This will help you make healthy decisions before you decide to purchase your food.  Make a grocery list and stick to it. Cooking  Try to cook your favorite foods in a healthier way. For example, try baking instead of frying.  Use low-fat dairy products. Meal planning  Use more fruits and vegetables. Half of your plate should be fruits and vegetables.  Include lean proteins like poultry and fish. How do I count calories when eating out?  Ask for smaller portion sizes.  Consider sharing an entree and sides instead of getting your own entree.  If you get your own entree, eat only half. Ask for a box at the beginning of your meal and put the rest of your entree in it so you are not tempted to eat it.  If calories are listed on the menu, choose the lower calorie options.  Choose dishes that include vegetables, fruits, whole grains, low-fat dairy products, and lean protein.  Choose items that are boiled, broiled, grilled, or steamed. Stay away from items that are buttered, battered, fried, or served with cream sauce. Items labeled "crispy" are usually fried, unless stated otherwise.  Choose water, low-fat milk, unsweetened iced tea, or other drinks without added sugar. If you want an alcoholic beverage, choose a lower calorie option such as a glass of wine or light beer.  Ask for dressings, sauces, and syrups on the side. These are usually high in calories, so you should limit the amount you eat.  If you want a salad, choose a garden salad and ask for grilled meats. Avoid extra toppings like bacon, cheese, or fried items. Ask for the dressing on the side, or ask for olive oil and vinegar or lemon to use as  dressing.  Estimate how many servings of a food you are given. For example, a serving of cooked rice is  cup or about the size of half a baseball. Knowing serving sizes will help you be aware of how much food you are eating at restaurants. The list below tells you how big or small some common portion sizes are based on everyday objects: ? 1 oz-4 stacked dice. ? 3 oz-1 deck of cards. ? 1 tsp-1 die. ? 1 Tbsp- a ping-pong ball. ? 2 Tbsp-1 ping-pong ball. ?  cup- baseball. ? 1 cup-1 baseball. Summary  Calorie counting means keeping track of how many calories you eat and drink each day. If you eat fewer calories than your body needs, you should lose weight.  A healthy amount of weight to lose per week is usually 1-2 lb (0.5-0.9 kg). This usually means reducing your daily calorie intake by 500-750 calories.  The number of calories in a food can be found on a Nutrition Facts label. If a food does not have a Nutrition Facts label, try to look up the calories online or ask your dietitian for help.  Use your calories on foods and drinks that will fill you up, and not on foods and drinks that will leave you hungry.  Use smaller plates, glasses, and bowls to prevent overeating. This information is not intended to replace advice given to you by your health care provider. Make sure you discuss any questions you have with your health care provider. Document Released: 09/14/2005 Document Revised: 08/14/2016 Document Reviewed: 08/14/2016 Elsevier Interactive Patient Education  2017 [employer].

## 2017-05-13 LAB — PAP, TP IMAGING W/ HPV RNA, RFLX HPV TYPE 16,18/45: HPV mRNA, High Risk: NOT DETECTED

## 2017-05-17 ENCOUNTER — Ambulatory Visit (INDEPENDENT_AMBULATORY_CARE_PROVIDER_SITE_OTHER): Payer: 59 | Admitting: Family Medicine

## 2017-05-17 ENCOUNTER — Encounter: Payer: Self-pay | Admitting: Family Medicine

## 2017-05-17 DIAGNOSIS — M25572 Pain in left ankle and joints of left foot: Secondary | ICD-10-CM | POA: Diagnosis not present

## 2017-05-17 MED ORDER — NITROGLYCERIN 0.2 MG/HR TD PT24: MEDICATED_PATCH | TRANSDERMAL | 1 refills | Status: DC

## 2017-05-17 NOTE — Patient Instructions (Signed)
You have peroneal tenosynovitis, tendinitis, and a partial tear of your peroneus longus. Do theraband strengthening exercises (red band) 3 sets of 10 once a day. Icing 15 minutes at a time 3-4 times a day. Continue wearing the arch supports as you have been. Avoid barefoot walking, flat shoes as much as possible. Nitro patches 1/4th patch to affected area, change daily. Voltaren gel up to 4 times a day OR aleve 2 tabs twice a day with food for pain and inflammation. Avoid regular squats, lunges, leg press - use pain as your guide for the TRX squats, chair squats, other lower body exercises - should be at or less than a 3/10 when you do these Follow up with me in 6 weeks for reevaluation.

## 2017-05-18 DIAGNOSIS — M25572 Pain in left ankle and joints of left foot: Secondary | ICD-10-CM | POA: Insufficient documentation

## 2017-05-18 NOTE — Assessment & Plan Note (Signed)
2/2 peroneal tenosynovitis, tendinitis, and partial tear of peroneus longus.  Start with icing, nitro patches, voltaren gel, home exercises which were reviewed today.  Has custom orthotics - encouraged to continue wearing regularly.  F/u in 6 weeks.

## 2017-05-18 NOTE — Progress Notes (Signed)
PCP: Midge Minium, MD  Subjective:   HPI: Patient is a 61 y.o. female here for left foot pain.  Patient reports about 3 weeks ago she was coming down the steps when she developed sharp pain lateral aspect of left ankle. Has had off and on pain since then. Pain level 3/10 laterally, sharp. Woke up several times due to pain. Has been wrapping left foot. Using topical voltaren occasionally - taking oral voltaren but irritates her stomach. Is going to a convention soon and will be doing a lot of walking. No skin changes, numbness. + swelling.  Past Medical History:  Diagnosis Date  . Breast mass, left 06/2016  . Carpal tunnel syndrome on both sides   . GERD (gastroesophageal reflux disease)   . Heart murmur   . Hypertension    states under control with med., has been on med. x 3-4 yr.  . LVH (left ventricular hypertrophy)    moderate, per echo 01/2016  . Obesity   . PONV (postoperative nausea and vomiting)   . Psoriatic arthritis ([place])   . Rosacea   . Thoracic ascending aortic aneurysm [facility])     Current Outpatient Prescriptions on File Prior to Visit  Medication Sig Dispense Refill  . Adalimumab (HUMIRA PEN) 40 MG/0.8ML PNKT Inject 40 mg into the skin once a week. 4 each 5  . b complex vitamins tablet Take 1 tablet by mouth daily.    . calcipotriene-betamethasone (TACLONEX) ointment Apply topically daily.    . diclofenac sodium (VOLTAREN) 1 % GEL Apply 2 g topically 3 (three) times daily. 100 g 3  . escitalopram (LEXAPRO) 10 MG tablet TAKE 1 TABLET BY MOUTH ONCE DAILY. 90 tablet 1  . fluticasone (FLONASE) 50 MCG/ACT nasal spray Place 2 sprays into both nostrils daily. 16 g 3  . hydrocortisone valerate (WEST-CORT) 0.2 % ointment Apply 1 application topically daily as needed. As needed     . loratadine (CLARITIN) 10 MG tablet Take 10 mg by mouth daily.      . metroNIDAZOLE (METROCREAM) 0.75 % cream Apply topically 2 (two) times daily.    . Multiple Vitamins-Minerals  (CENTRUM SILVER PO) Take 1 tablet by mouth daily. Use as directed     . Omega-3 Fatty Acids (FISH OIL PO) Take 1 capsule by mouth daily.    . pantoprazole (PROTONIX) 40 MG tablet TAKE 1 TABLET BY MOUTH DAILY. 90 tablet 2  . VITAMIN D, CHOLECALCIFEROL, PO Take 2,000 Units by mouth daily.      No current facility-administered medications on file prior to visit.     Past Surgical History:  Procedure Laterality Date  . COLONOSCOPY WITH PROPOFOL  09/12/2015  . LAPAROSCOPIC ENDOMETRIOSIS FULGURATION    . MICRODISCECTOMY LUMBAR Right 07/16/2011   L4-5  . PELVIC LAPAROSCOPY     DIAG LAP  . RADIOACTIVE SEED GUIDED EXCISIONAL BREAST BIOPSY Left 07/16/2016   Procedure: LEFT RADIOACTIVE SEED GUIDED EXCISIONAL BREAST BIOPSY;  Surgeon: Rolm Bookbinder, MD;  Location: [facility];  Service: General;  Laterality: Left;    Allergies  Allergen Reactions  . Adhesive [Tape] Other (See Comments)    SKIN IRRITATION    Social History   Social History  . Marital status: Married    Spouse name: N/A  . Number of children: N/A  . Years of education: N/A   Occupational History  . Not on file.   Social History Main Topics  . Smoking status: Never Smoker  . Smokeless tobacco: Never Used  .  Alcohol use 0.0 oz/week     Comment: socially  . Drug use: No  . Sexual activity: No     Comment: 1st intercourse- 40, partners- married- 31 yrs    Other Topics Concern  . Not on file   Social History Narrative  . No narrative on file    Family History  Problem Relation Age of Onset  . Breast cancer Mother   . Cancer Father        prostate cancer, lymphoma  . Heart disease Father   . Hypertension Father   . CAD Father   . Cancer Brother        prostate cancer  . Asthma Brother   . CAD Brother   . Hypertension Brother     BP 126/79   Pulse 63   Ht 5\' 6"  (1.676 m)   Wt 295 lb (133.8 kg)   BMI 47.61 kg/m   Review of Systems: See HPI above.     Objective:  Physical  Exam:  Gen: NAD, comfortable in exam room  Left foot/ankle: Mild lateral ankle swelling.  No bruising, other deformity. FROM with pain on plantar flexion > ER. TTP over peroneal tendons.  No other tenderness. Negative ant drawer and talar tilt.   Negative syndesmotic compression. Thompsons test negative. NV intact distally.  Right foot/ankle: FROM without pain.   MSK u/s left ankle:  Thickening of both peroneal tendons with tenosynovitis.  Small partial tear of peroneus longus just below level of ankle joint.  No other abnormalities.  Assessment & Plan:  1. Left ankle injury - 2/2 peroneal tenosynovitis, tendinitis, and partial tear of peroneus longus.  Start with icing, nitro patches, voltaren gel, home exercises which were reviewed today.  Has custom orthotics - encouraged to continue wearing regularly.  F/u in 6 weeks.

## 2017-06-07 DIAGNOSIS — L409 Psoriasis, unspecified: Secondary | ICD-10-CM | POA: Diagnosis not present

## 2017-06-07 DIAGNOSIS — L4050 Arthropathic psoriasis, unspecified: Secondary | ICD-10-CM | POA: Diagnosis not present

## 2017-06-07 DIAGNOSIS — M170 Bilateral primary osteoarthritis of knee: Secondary | ICD-10-CM | POA: Diagnosis not present

## 2017-06-07 DIAGNOSIS — Z6841 Body Mass Index (BMI) 40.0 and over, adult: Secondary | ICD-10-CM | POA: Diagnosis not present

## 2017-06-07 DIAGNOSIS — M779 Enthesopathy, unspecified: Secondary | ICD-10-CM | POA: Diagnosis not present

## 2017-06-07 DIAGNOSIS — R768 Other specified abnormal immunological findings in serum: Secondary | ICD-10-CM | POA: Diagnosis not present

## 2017-06-22 IMAGING — MG 2D DIGITAL DIAGNOSTIC UNILATERAL LEFT MAMMOGRAM WITH CAD AND ADJ
6 series · 6 of 14 positions shown · non-contrast
Comparison: Previous exam(s).

CLINICAL DATA: Screening recall for left breast mass.

EXAM:
2D DIGITAL DIAGNOSTIC UNILATERAL LEFT MAMMOGRAM WITH CAD AND ADJUNCT
TOMO
LEFT BREAST ULTRASOUND

[L CC]
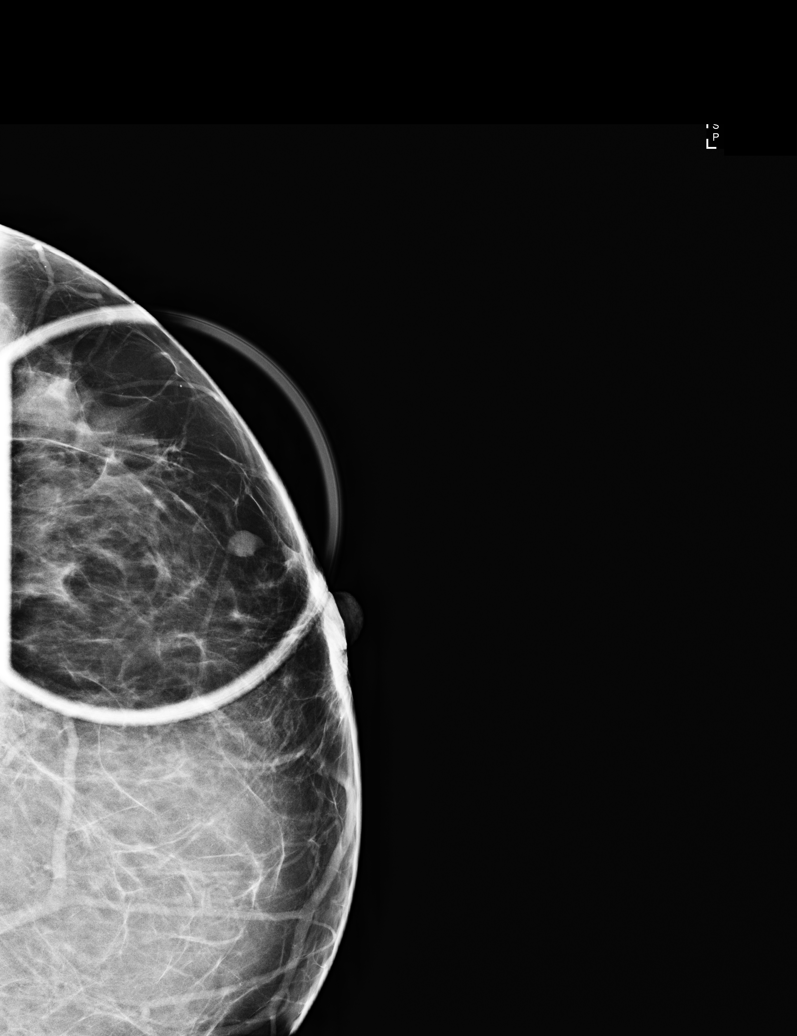

[L MLO synth-2D]
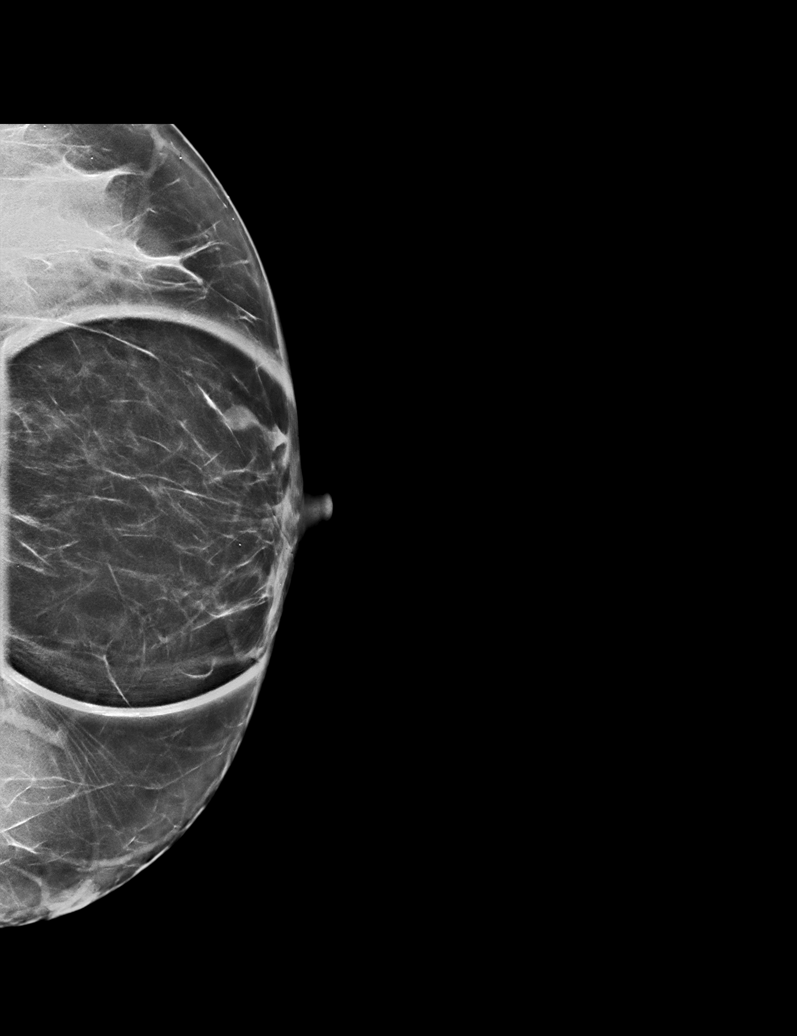

[L CC synth-2D]
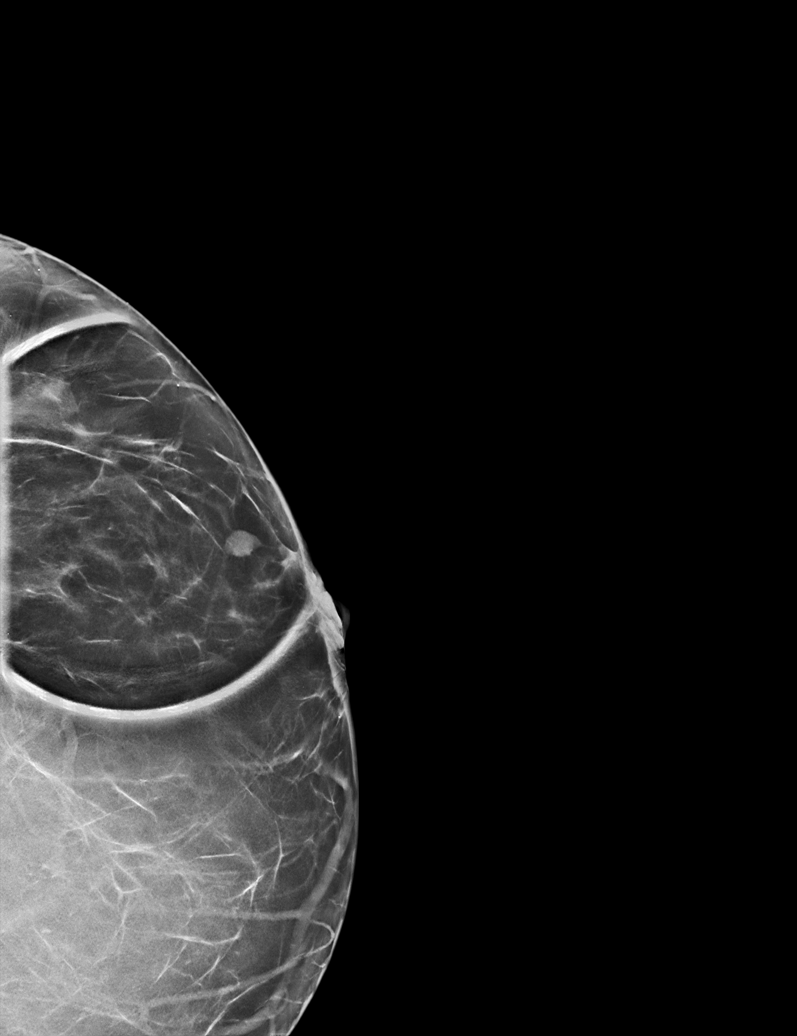

[L MLO]
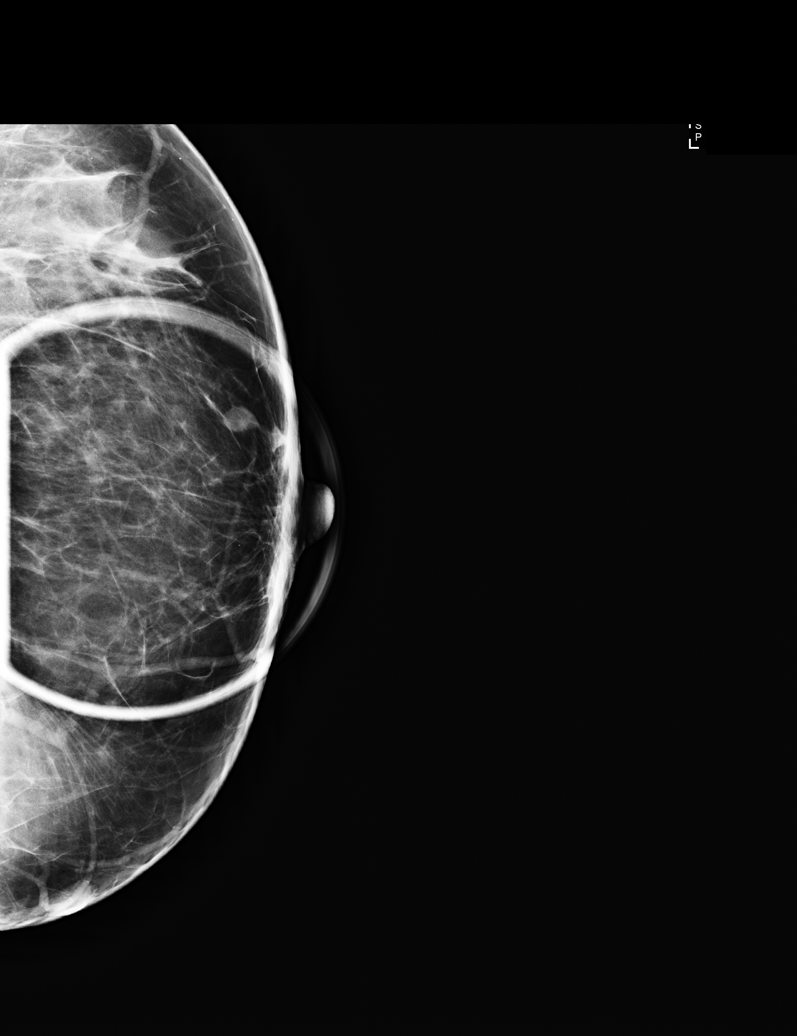

[L MLO tomo · tomo slice 37/72.0]
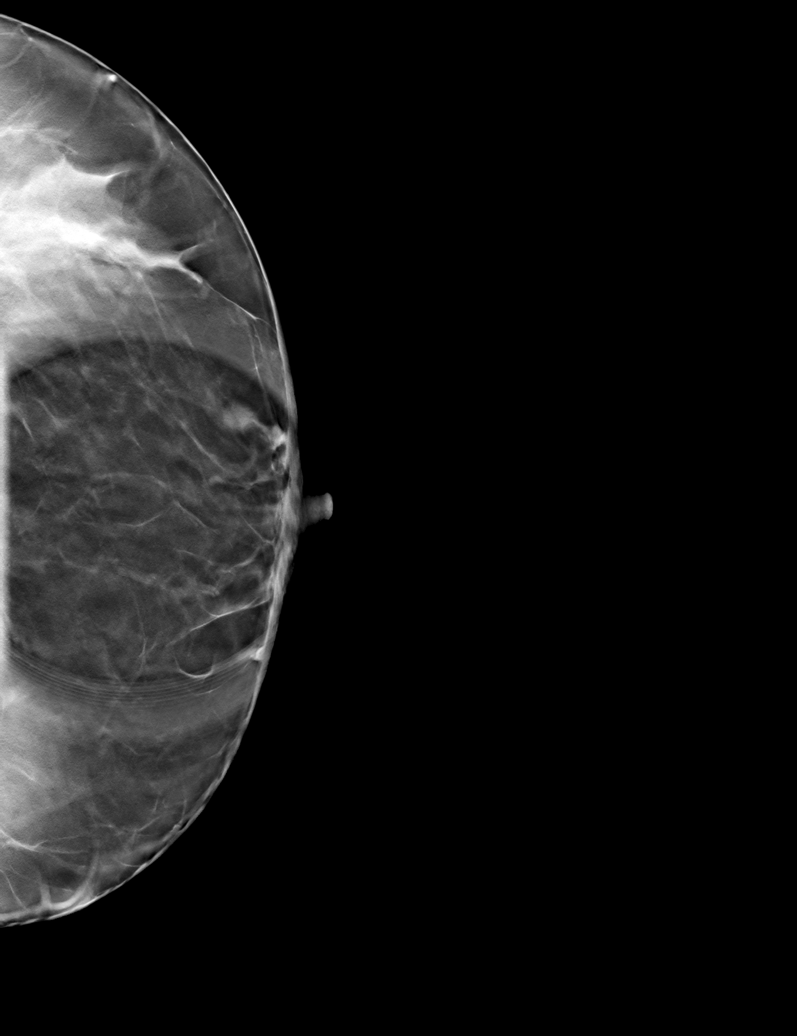

[L CC tomo · tomo slice 35/68.0]
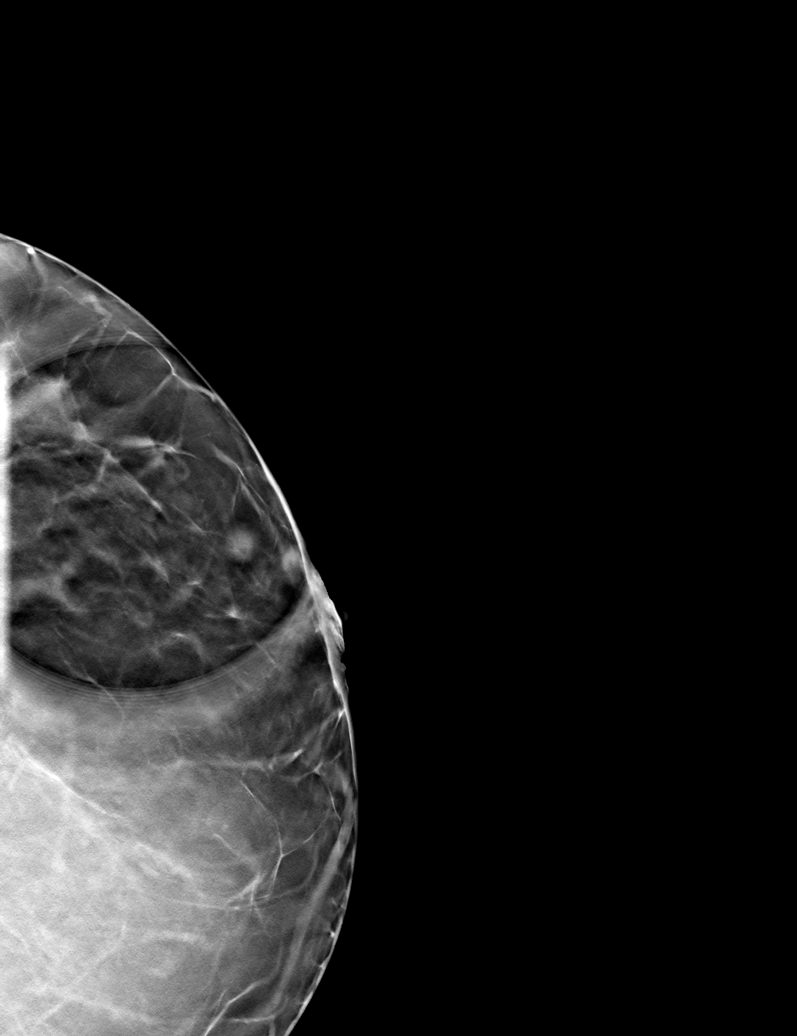

[6 of 14 positions shown; findings below may reference images not displayed]

ACR Breast Density Category b: There are scattered areas of
fibroglandular density.
FINDINGS: Spot compression CC and MLO tomograms were performed of the left
breast demonstrating an oval circumscribed mass in the
periareolar/slightly outer left breast measuring approximately 7 mm.

Mammographic images were processed with CAD.

Physical examination of the slightly outer left breast does not
reveal any palpable masses.

Targeted ultrasound of the left breast was performed demonstrating a
mixed cystic and solid mass at 3 o'clock subareolar measuring 0.6 x
0.6 x 0.6 cm. This corresponds with the mass seen at mammography. No
lymphadenopathy seen in the left axilla.
IMPRESSION: Suspicious mass in the left breast at 3 o'clock subareolar.

RECOMMENDATION:
Ultrasound-guided biopsy of the mass in the left breast at 3 o'clock
subareolar is recommended. This is being scheduled for the patient.

I have discussed the findings and recommendations with the patient.
Results were also provided in writing at the conclusion of the
visit. If applicable, a reminder letter will be sent to the patient
regarding the next appointment.

BI-RADS CATEGORY  4: Suspicious.

## 2017-06-28 ENCOUNTER — Ambulatory Visit: Payer: 59 | Admitting: Family Medicine

## 2017-06-30 ENCOUNTER — Ambulatory Visit (INDEPENDENT_AMBULATORY_CARE_PROVIDER_SITE_OTHER): Payer: 59 | Admitting: Family Medicine

## 2017-06-30 ENCOUNTER — Encounter: Payer: Self-pay | Admitting: Family Medicine

## 2017-06-30 DIAGNOSIS — M25572 Pain in left ankle and joints of left foot: Secondary | ICD-10-CM | POA: Diagnosis not present

## 2017-06-30 DIAGNOSIS — M25561 Pain in right knee: Secondary | ICD-10-CM

## 2017-06-30 NOTE — Patient Instructions (Signed)
Do the home exercises for your ankle about 3-4 times a week for 4 more weeks. Use nitro patches for 6 more weeks then you can stop these. Call me if you have any problems otherwise follow up with me as needed.

## 2017-07-01 DIAGNOSIS — M25561 Pain in right knee: Secondary | ICD-10-CM | POA: Insufficient documentation

## 2017-07-01 NOTE — Progress Notes (Signed)
PCP: Midge Minium, MD  Subjective:   HPI: Patient is a 61 y.o. female here for left foot pain.  8/20: Patient reports about 3 weeks ago she was coming down the steps when she developed sharp pain lateral aspect of left ankle. Has had off and on pain since then. Pain level 3/10 laterally, sharp. Woke up several times due to pain. Has been wrapping left foot. Using topical voltaren occasionally - taking oral voltaren but irritates her stomach. Is going to a convention soon and will be doing a lot of walking. No skin changes, numbness. + swelling.  10/3: Patient reports she feels 80% improved compared to last visit. Doing some of her home exercises, using nitro patches, inserts. Some pain with hills. Pain 0/10 currently, occasional soreness though lateral ankle. She also reports about 2 weeks ago she accidentally twisted her right knee and had severe pain initially - significantly improved since then with a little soreness with steps especially. No skin changes, numbness.  Past Medical History:  Diagnosis Date  . Breast mass, left 06/2016  . Carpal tunnel syndrome on both sides   . GERD (gastroesophageal reflux disease)   . Heart murmur   . Hypertension    states under control with med., has been on med. x 3-4 yr.  . LVH (left ventricular hypertrophy)    moderate, per echo 01/2016  . Obesity   . PONV (postoperative nausea and vomiting)   . Psoriatic arthritis ([place])   . Rosacea   . Thoracic ascending aortic aneurysm [facility])     Current Outpatient Prescriptions on File Prior to Visit  Medication Sig Dispense Refill  . Adalimumab (HUMIRA PEN) 40 MG/0.8ML PNKT Inject 40 mg into the skin once a week. 4 each 5  . b complex vitamins tablet Take 1 tablet by mouth daily.    . calcipotriene-betamethasone (TACLONEX) ointment Apply topically daily.    . diclofenac sodium (VOLTAREN) 1 % GEL Apply 2 g topically 3 (three) times daily. 100 g 3  . escitalopram (LEXAPRO) 10 MG tablet  TAKE 1 TABLET BY MOUTH ONCE DAILY. 90 tablet 1  . fluticasone (FLONASE) 50 MCG/ACT nasal spray Place 2 sprays into both nostrils daily. 16 g 3  . hydrocortisone valerate (WEST-CORT) 0.2 % ointment Apply 1 application topically daily as needed. As needed     . loratadine (CLARITIN) 10 MG tablet Take 10 mg by mouth daily.      . metoprolol tartrate (LOPRESSOR) 25 MG tablet   3  . metroNIDAZOLE (METROCREAM) 0.75 % cream Apply topically 2 (two) times daily.    . Multiple Vitamins-Minerals (CENTRUM SILVER PO) Take 1 tablet by mouth daily. Use as directed     . nitroGLYCERIN (NITRODUR - DOSED IN MG/24 HR) 0.2 mg/hr patch Apply 1/4th patch to affected ankle, change daily 30 patch 1  . Omega-3 Fatty Acids (FISH OIL PO) Take 1 capsule by mouth daily.    . pantoprazole (PROTONIX) 40 MG tablet TAKE 1 TABLET BY MOUTH DAILY. 90 tablet 2  . VITAMIN D, CHOLECALCIFEROL, PO Take 2,000 Units by mouth daily.      No current facility-administered medications on file prior to visit.     Past Surgical History:  Procedure Laterality Date  . COLONOSCOPY WITH PROPOFOL  09/12/2015  . LAPAROSCOPIC ENDOMETRIOSIS FULGURATION    . MICRODISCECTOMY LUMBAR Right 07/16/2011   L4-5  . PELVIC LAPAROSCOPY     DIAG LAP  . RADIOACTIVE SEED GUIDED EXCISIONAL BREAST BIOPSY Left 07/16/2016   Procedure:  LEFT RADIOACTIVE SEED GUIDED EXCISIONAL BREAST BIOPSY;  Surgeon: Rolm Bookbinder, MD;  Location: [facility];  Service: General;  Laterality: Left;    Allergies  Allergen Reactions  . Adhesive [Tape] Other (See Comments)    SKIN IRRITATION    Social History   Social History  . Marital status: Married    Spouse name: N/A  . Number of children: N/A  . Years of education: N/A   Occupational History  . Not on file.   Social History Main Topics  . Smoking status: Never Smoker  . Smokeless tobacco: Never Used  . Alcohol use 0.0 oz/week     Comment: socially  . Drug use: No  . Sexual activity: No      Comment: 1st intercourse- 58, partners- married- 31 yrs    Other Topics Concern  . Not on file   Social History Narrative  . No narrative on file    Family History  Problem Relation Age of Onset  . Breast cancer Mother   . Cancer Father        prostate cancer, lymphoma  . Heart disease Father   . Hypertension Father   . CAD Father   . Cancer Brother        prostate cancer  . Asthma Brother   . CAD Brother   . Hypertension Brother     BP 138/73   Pulse (!) 58   Ht 5\' 6"  (1.676 m)   Wt 295 lb (133.8 kg)   BMI 47.61 kg/m   Review of Systems: See HPI above.     Objective:  Physical Exam:  Gen: NAD, comfortable in exam room.  Left foot/ankle: Mild lateral ankle swelling.  No bruising, other deformity. FROM without pain.  5/5 strength all directions. No TTP currently. Negative ant drawer and talar tilt.   Negative syndesmotic compression. Thompsons test negative. NV intact distally.  Right foot/ankle: FROM without pain.  Right knee: No gross deformity, ecchymoses, swelling. Mild lateral joint line tenderness only. FROM. Negative ant/post drawers. Negative valgus/varus testing. Negative lachmanns. Negative mcmurrays, apleys, patellar apprehension. NV intact distally.  Left knee: FROM without pain.  Assessment & Plan:  1. Left ankle injury - 2/2 peroneal tenosynovitis, tendinitis, partial tear of peroneus longus.  Continue home exercises and nitro patches.  Can d/c these in 4-6 weeks if doing well.  F/u prn otherwise.  Continue her orthotics.  2. Right knee pain - 2/2 either contusion or flare of arthritis of her knee.  Reassured.  Tylenol, ibuprofen if needed.

## 2017-07-01 NOTE — Assessment & Plan Note (Signed)
2/2 peroneal tenosynovitis, tendinitis, partial tear of peroneus longus.  Continue home exercises and nitro patches.  Can d/c these in 4-6 weeks if doing well.  F/u prn otherwise.  Continue her orthotics.

## 2017-07-01 NOTE — Assessment & Plan Note (Signed)
2/2 either contusion or flare of arthritis of her knee.  Reassured.  Tylenol, ibuprofen if needed.

## 2017-08-02 ENCOUNTER — Other Ambulatory Visit: Payer: Self-pay | Admitting: Internal Medicine

## 2017-08-03 ENCOUNTER — Ambulatory Visit: Payer: 59 | Attending: Internal Medicine | Admitting: Pharmacist

## 2017-08-03 ENCOUNTER — Other Ambulatory Visit: Payer: Self-pay | Admitting: Pharmacist

## 2017-08-03 ENCOUNTER — Encounter: Payer: Self-pay | Admitting: Pharmacist

## 2017-08-03 DIAGNOSIS — L4050 Arthropathic psoriasis, unspecified: Secondary | ICD-10-CM

## 2017-08-03 MED ORDER — ADALIMUMAB 40 MG/0.8ML ~~LOC~~ AJKT: 0.8000 mL | AUTO-INJECTOR | SUBCUTANEOUS | 5 refills | Status: DC

## 2017-08-03 NOTE — Progress Notes (Signed)
   S: Patient presents today to the [facility] for review of her specialty medication..  Patient is currently taking Humira for psoriatic arthritis. Patient is managed by Dr. Trudie Reed for this.   Adherence: denies any missed doses. Reports rotating injection sites.  Efficacy: reports that it is working well.  Dosing:  Psoriatic arthritis: SubQ: 40 mg every other week  Screening: TB test: completed per patient Hepatitis: completed per patient  Monitoring: S/sx of infection: denies CBC: q3 months, have been normal at Dr. Trudie Reed' office. Last one in Epic July 2018 - normal.  S/sx of hypersensitivity: denies S/sx of malignancy: denies S/sx of heart failure: denies    O:     Lab Results  Component Value Date   WBC 7.9 04/20/2017   HGB 12.7 04/20/2017   HCT 38.5 04/20/2017   MCV 91.4 04/20/2017   PLT 245.0 04/20/2017      Chemistry      Component Value Date/Time   NA 140 04/20/2017 0847   K 4.2 04/20/2017 0847   CL 104 04/20/2017 0847   CO2 30 04/20/2017 0847   BUN 17 04/20/2017 0847   CREATININE 0.82 04/20/2017 0847   CREATININE 0.77 12/31/2015 0901      Component Value Date/Time   CALCIUM 9.7 04/20/2017 0847   ALKPHOS 68 04/20/2017 0847   AST 22 04/20/2017 0847   ALT 20 04/20/2017 0847   BILITOT 0.6 04/20/2017 0847       A/P: 1. Medication review: Patient on Humira for psoriatic arthritis and is tolerating it well with no adverse effects and with improved control of her psoriatic arthritis. Reviewed the medication with her, including the following: Humira is a TNF blocking agent indicated for ankylosing spondylitis, Crohn's disease, Hidradenitis suppurativa, psoriatic arthritis, plaque psoriasis, ulcerative colitis, and uveitis. The most common adverse effects are infections, headache, and injection site reactions. There is the possibility of an increased risk of malignancy but it is not well understood if this  increased risk is due to there medication or the disease state. There are rare cases of pancytopenia and aplastic anemia. No recommendations for changes.    Christella Hartigan, PharmD, BCPS, BCACP, [facility] [phone]

## 2017-10-13 ENCOUNTER — Other Ambulatory Visit: Payer: Self-pay | Admitting: Family Medicine

## 2017-10-13 DIAGNOSIS — L400 Psoriasis vulgaris: Secondary | ICD-10-CM | POA: Diagnosis not present

## 2017-10-13 DIAGNOSIS — L718 Other rosacea: Secondary | ICD-10-CM | POA: Diagnosis not present

## 2017-10-26 ENCOUNTER — Encounter: Payer: Self-pay | Admitting: Family Medicine

## 2017-10-26 ENCOUNTER — Other Ambulatory Visit: Payer: Self-pay

## 2017-10-26 ENCOUNTER — Ambulatory Visit (INDEPENDENT_AMBULATORY_CARE_PROVIDER_SITE_OTHER): Payer: 59 | Admitting: Family Medicine

## 2017-10-26 VITALS — BP 136/76 | HR 73 | Temp 98.2°F | Resp 16 | Ht 66.0 in | Wt 319.0 lb

## 2017-10-26 DIAGNOSIS — I712 Thoracic aortic aneurysm, without rupture: Secondary | ICD-10-CM | POA: Diagnosis not present

## 2017-10-26 DIAGNOSIS — I7121 Aneurysm of the ascending aorta, without rupture: Secondary | ICD-10-CM

## 2017-10-26 DIAGNOSIS — I10 Essential (primary) hypertension: Secondary | ICD-10-CM | POA: Diagnosis not present

## 2017-10-26 LAB — BASIC METABOLIC PANEL
BUN: 19 mg/dL (ref 6–23)
CO2: 29 mEq/L (ref 19–32)
Calcium: 9.2 mg/dL (ref 8.4–10.5)
Chloride: 106 mEq/L (ref 96–112)
Creatinine, Ser: 0.75 mg/dL (ref 0.40–1.20)
GFR: 83.35 mL/min (ref >60.00)
Glucose, Bld: 107 mg/dL — ABNORMAL HIGH (ref 70–99)
Potassium: 4.7 mEq/L (ref 3.5–5.1)
Sodium: 141 mEq/L (ref 135–145)

## 2017-10-26 LAB — CBC WITH DIFFERENTIAL/PLATELET
Basophils Absolute: 0.1 10*3/uL (ref 0.0–0.1)
Basophils Relative: 1.2 % (ref 0.0–3.0)
Eosinophils Absolute: 0.4 10*3/uL (ref 0.0–0.7)
Eosinophils Relative: 5.6 % — ABNORMAL HIGH (ref 0.0–5.0)
HCT: 40.4 % (ref 36.0–46.0)
Hemoglobin: 13.4 g/dL (ref 12.0–15.0)
Lymphocytes Relative: 25.2 % (ref 12.0–46.0)
Lymphs Abs: 1.7 10*3/uL (ref 0.7–4.0)
MCHC: 33.1 g/dL (ref 30.0–36.0)
MCV: 89.8 fl (ref 78.0–100.0)
Monocytes Absolute: 0.5 10*3/uL (ref 0.1–1.0)
Monocytes Relative: 7.2 % (ref 3.0–12.0)
Neutro Abs: 4 10*3/uL (ref 1.4–7.7)
Neutrophils Relative %: 60.8 % (ref 43.0–77.0)
Platelets: 243 10*3/uL (ref 150.0–400.0)
RBC: 4.5 Mil/uL (ref 3.87–5.11)
RDW: 14.2 % (ref 11.5–15.5)
WBC: 6.6 10*3/uL (ref 4.0–10.5)

## 2017-10-26 LAB — HEPATIC FUNCTION PANEL
ALT: 22 U/L (ref 0–35)
AST: 21 U/L (ref 0–37)
Albumin: 4 g/dL (ref 3.5–5.2)
Alkaline Phosphatase: 70 U/L (ref 39–117)
Bilirubin, Direct: 0.1 mg/dL (ref 0.0–0.3)
Total Bilirubin: 0.4 mg/dL (ref 0.2–1.2)
Total Protein: 7.2 g/dL (ref 6.0–8.3)

## 2017-10-26 LAB — LIPID PANEL
Cholesterol: 148 mg/dL (ref 0–200)
HDL: 48.4 mg/dL (ref >39.00)
LDL Cholesterol: 85 mg/dL (ref 0–99)
NonHDL: 99.58
Total CHOL/HDL Ratio: 3
Triglycerides: 73 mg/dL (ref 0.0–149.0)
VLDL: 14.6 mg/dL (ref 0.0–40.0)

## 2017-10-26 LAB — HEMOGLOBIN A1C: Hgb A1c MFr Bld: 5.8 % (ref 4.6–6.5)

## 2017-10-26 LAB — TSH: TSH: 2.37 u[IU]/mL (ref 0.35–4.50)

## 2017-10-26 NOTE — Assessment & Plan Note (Signed)
Chronic problem.  Following w/ Dr Oval Linsey yearly and having imaging done.  Currently asymptomatic.

## 2017-10-26 NOTE — Assessment & Plan Note (Signed)
Deteriorated.  Pt has gained 25 lbs since October.  Stressed need to work on [employer] and regular exercise as this is the biggest risk to her health.  Check labs to risk stratify.  Will follow.

## 2017-10-26 NOTE — Assessment & Plan Note (Signed)
Chronic problem.  Adequate control.  Stressed need for healthy diet and regular exercise.  Check labs but no anticipated med changes.  Will follow.

## 2017-10-26 NOTE — Patient Instructions (Addendum)
Schedule your complete physical in 6 months We'll notify you of your lab results and make any changes if needed Try and work on healthy diet and regular exercise- you can do it and you owe it to yourself!!! SCHEDULE YOUR MAMMOGRAM!!! Call with any questions or concerns Hang in there!!!

## 2017-10-26 NOTE — Progress Notes (Signed)
   Subjective:    Patient ID: Virginia Matthews, female    DOB: 06-Aug-1956, 62 y.o.   MRN: [id number]  HPI HTN- chronic problem.  Currently on Metoprolol 25mg  w/ adequate control.  No CP.  + SOB w/ exertion due to weight gain.  No HAs, visual changes, edema.  Obesity- pt has gained 25 lbs since October.  BMI is 51.49.  Admits to not taking care of herself.  She is stress eating- husband and father are not doing well.  Thoracic aortic aneurysm- following w/ Dr Oval Linsey once yearly and having imaging.   Review of Systems For ROS see HPI     Objective:   Physical Exam  Constitutional: She is oriented to person, place, and time. She appears well-developed and well-nourished. No distress.  Morbidly obese  HENT:  Head: Normocephalic and atraumatic.  Eyes: Conjunctivae and EOM are normal. Pupils are equal, round, and reactive to light.  Neck: Normal range of motion. Neck supple. No thyromegaly present.  Cardiovascular: Normal rate, regular rhythm, normal heart sounds and intact distal pulses.  No murmur heard. Pulmonary/Chest: Effort normal and breath sounds normal. No respiratory distress.  Abdominal: Soft. She exhibits no distension. There is no tenderness.  Musculoskeletal: She exhibits no edema.  Lymphadenopathy:    She has no cervical adenopathy.  Neurological: She is alert and oriented to person, place, and time.  Skin: Skin is warm and dry.  Psychiatric: She has a normal mood and affect. Her behavior is normal.  Vitals reviewed.         Assessment & Plan:

## 2017-10-27 ENCOUNTER — Encounter: Payer: Self-pay | Admitting: General Practice

## 2017-11-19 DIAGNOSIS — B029 Zoster without complications: Secondary | ICD-10-CM | POA: Diagnosis not present

## 2017-11-19 DIAGNOSIS — D224 Melanocytic nevi of scalp and neck: Secondary | ICD-10-CM | POA: Diagnosis not present

## 2017-11-19 DIAGNOSIS — L821 Other seborrheic keratosis: Secondary | ICD-10-CM | POA: Diagnosis not present

## 2017-11-19 DIAGNOSIS — L918 Other hypertrophic disorders of the skin: Secondary | ICD-10-CM | POA: Diagnosis not present

## 2017-11-19 DIAGNOSIS — D2239 Melanocytic nevi of other parts of face: Secondary | ICD-10-CM | POA: Diagnosis not present

## 2017-11-19 DIAGNOSIS — D225 Melanocytic nevi of trunk: Secondary | ICD-10-CM | POA: Diagnosis not present

## 2017-11-19 DIAGNOSIS — D2272 Melanocytic nevi of left lower limb, including hip: Secondary | ICD-10-CM | POA: Diagnosis not present

## 2017-11-19 DIAGNOSIS — L814 Other melanin hyperpigmentation: Secondary | ICD-10-CM | POA: Diagnosis not present

## 2017-11-19 DIAGNOSIS — D2271 Melanocytic nevi of right lower limb, including hip: Secondary | ICD-10-CM | POA: Diagnosis not present

## 2017-11-23 ENCOUNTER — Other Ambulatory Visit: Payer: Self-pay | Admitting: Family Medicine

## 2017-11-29 ENCOUNTER — Other Ambulatory Visit: Payer: Self-pay | Admitting: Physician Assistant

## 2017-11-29 DIAGNOSIS — R002 Palpitations: Secondary | ICD-10-CM

## 2017-11-29 NOTE — Telephone Encounter (Signed)
Please review for refill. Thanks!  

## 2017-12-27 DIAGNOSIS — M2550 Pain in unspecified joint: Secondary | ICD-10-CM | POA: Diagnosis not present

## 2017-12-27 DIAGNOSIS — M170 Bilateral primary osteoarthritis of knee: Secondary | ICD-10-CM | POA: Diagnosis not present

## 2017-12-27 DIAGNOSIS — Z79899 Other long term (current) drug therapy: Secondary | ICD-10-CM | POA: Diagnosis not present

## 2017-12-27 DIAGNOSIS — R768 Other specified abnormal immunological findings in serum: Secondary | ICD-10-CM | POA: Diagnosis not present

## 2017-12-27 DIAGNOSIS — L4050 Arthropathic psoriasis, unspecified: Secondary | ICD-10-CM | POA: Diagnosis not present

## 2017-12-27 DIAGNOSIS — Z6841 Body Mass Index (BMI) 40.0 and over, adult: Secondary | ICD-10-CM | POA: Diagnosis not present

## 2017-12-27 DIAGNOSIS — L409 Psoriasis, unspecified: Secondary | ICD-10-CM | POA: Diagnosis not present

## 2017-12-28 ENCOUNTER — Other Ambulatory Visit: Payer: Self-pay | Admitting: Pharmacist

## 2017-12-28 MED ORDER — ADALIMUMAB 40 MG/0.8ML ~~LOC~~ AJKT: 0.8000 mL | AUTO-INJECTOR | SUBCUTANEOUS | 6 refills | Status: DC

## 2017-12-28 MED ORDER — ADALIMUMAB 40 MG/0.4ML ~~LOC~~ AJKT: 1.0000 "pen " | AUTO-INJECTOR | SUBCUTANEOUS | 6 refills | Status: DC

## 2017-12-29 ENCOUNTER — Encounter: Payer: Self-pay | Admitting: Cardiovascular Disease

## 2017-12-29 ENCOUNTER — Ambulatory Visit (INDEPENDENT_AMBULATORY_CARE_PROVIDER_SITE_OTHER): Payer: 59 | Admitting: Cardiovascular Disease

## 2017-12-29 VITALS — BP 106/80 | HR 59 | Ht 66.0 in | Wt 303.0 lb

## 2017-12-29 DIAGNOSIS — R002 Palpitations: Secondary | ICD-10-CM | POA: Diagnosis not present

## 2017-12-29 DIAGNOSIS — I10 Essential (primary) hypertension: Secondary | ICD-10-CM | POA: Diagnosis not present

## 2017-12-29 DIAGNOSIS — I7121 Aneurysm of the ascending aorta, without rupture: Secondary | ICD-10-CM

## 2017-12-29 DIAGNOSIS — I712 Thoracic aortic aneurysm, without rupture: Secondary | ICD-10-CM

## 2017-12-29 MED ORDER — METOPROLOL TARTRATE 25 MG PO TABS: 12.5000 mg | ORAL_TABLET | Freq: Two times a day (BID) | ORAL | 3 refills | Status: DC

## 2017-12-29 NOTE — Patient Instructions (Addendum)
Medication Instructions:  Your physician recommends that you continue on your current medications as directed. Please refer to the Current Medication list given to you today.  Labwork: NONE  Testing/Procedures: Your physician has requested that you have an echocardiogram. Echocardiography is a painless test that uses sound waves to create images of your heart. It provides your doctor with information about the size and shape of your heart and how well your heart's chambers and valves are working. This procedure takes approximately one hour. There are no restrictions for this procedure. MAY 2020  [place] [address]  Follow-Up: Your physician wants you to follow-up in: AFTER ECHO IN MAY 2020 You will receive a reminder letter in the mail two months in advance. If you don't receive a letter, please call our office to schedule the follow-up appointment.  MONITOR YOU BLOOD PRESSURE AT HOME. CALL THE OFFICE IF IT DOES NOT REMAIN BELOW 130/80   If you need a refill on your cardiac medications before your next appointment, please call your pharmacy.

## 2017-12-29 NOTE — Progress Notes (Signed)
Cardiology Office Note   Date:  12/29/2017   ID:  Virginia Matthews, DOB 05-15-1956, MRN [id number]  PCP:  Midge Minium, MD  Cardiologist:   Skeet Latch, MD   Chief Complaint  Patient presents with  . Follow-up    yearly visit, no conerns      History of Present Illness: Virginia Matthews is a 62 y.o. female with hypertension and obesity who presents for follow up.  Virginia Matthews initially presented 12/2015 with atypical chest pain that occurred in stressful situations.  She also has a family history of premature CAD.  Therefore she was referred for cardiac CT angiography and coronary calcium scoring.  Her coronary calcium score was 0 and there were no obstructive lesions.  It showed a dilated pulmonary artery suggestive of pulmonary hypertension and her ascending thoracic aortic was 4.2 cm.  She was referred for echocardiography that revealed PASP 37 mmHg and was otherwise unremarkable.   She followed up with Almyra Deforest, PA, on 07/2016 for palpitations.  She wore a 24-hour Holter 08/2016 that revealed frequent PVCs and occasional PACs.  She was started on metoprolol.  Since her last appointment Virginia Matthews has been well.  She is exercising 5 days/week.  She does yoga on one day and does a recumbent bike on the other 4 days.  She is also started to incorporate body weight exercises.  She has no chest pain or unusual shortness of breath with exertion.  She has mild lower extremity edema at the end of the day but it improves with elevation of her legs.  She has no orthopnea or PND.  She continues to have some palpitations that bother her mostly in the evenings when she is watching TV or when she tries to go to bed.  However she is no longer bothered by these.  She has been stressed lately as her father's lymphoma has recurred and her husband has been ill as well.  She is looking forward to a trip to [place] soon.   Past Medical History:  Diagnosis Date  .  Breast mass, left 06/2016  . Carpal tunnel syndrome on both sides   . GERD (gastroesophageal reflux disease)   . Heart murmur   . Hypertension    states under control with med., has been on med. x 3-4 yr.  . LVH (left ventricular hypertrophy)    moderate, per echo 01/2016  . Obesity   . PONV (postoperative nausea and vomiting)   . Psoriatic arthritis ([place])   . Rosacea   . Thoracic ascending aortic aneurysm [facility])     Past Surgical History:  Procedure Laterality Date  . COLONOSCOPY WITH PROPOFOL  09/12/2015  . LAPAROSCOPIC ENDOMETRIOSIS FULGURATION    . MICRODISCECTOMY LUMBAR Right 07/16/2011   L4-5  . PELVIC LAPAROSCOPY     DIAG LAP  . RADIOACTIVE SEED GUIDED EXCISIONAL BREAST BIOPSY Left 07/16/2016   Procedure: LEFT RADIOACTIVE SEED GUIDED EXCISIONAL BREAST BIOPSY;  Surgeon: Rolm Bookbinder, MD;  Location: [facility];  Service: General;  Laterality: Left;     Current Outpatient Medications  Medication Sig Dispense Refill  . Adalimumab (HUMIRA PEN) 40 MG/0.4ML PNKT Inject 1 pen into the skin once a week. 4 each 6  . b complex vitamins tablet Take 1 tablet by mouth daily.    . calcipotriene-betamethasone (TACLONEX) ointment Apply topically daily.    Marland Kitchen escitalopram (LEXAPRO) 10 MG tablet TAKE 1 TABLET BY MOUTH ONCE DAILY. 90 tablet 1  .  fluticasone (FLONASE) 50 MCG/ACT nasal spray Place 2 sprays into both nostrils daily. 16 g 3  . hydrocortisone valerate cream (WESTCORT) 0.2 %   3  . metoprolol tartrate (LOPRESSOR) 25 MG tablet Take 0.5 tablets (12.5 mg total) by mouth 2 (two) times daily. Ok to take an extra as needed 135 tablet 3  . metroNIDAZOLE (METROGEL) 0.75 % gel   11  . Multiple Vitamins-Minerals (CENTRUM SILVER PO) Take 1 tablet by mouth daily. Use as directed     . Omega-3 Fatty Acids (FISH OIL PO) Take 1 capsule by mouth daily.    . pantoprazole (PROTONIX) 40 MG tablet TAKE 1 TABLET BY MOUTH DAILY. 90 tablet 2  . TURMERIC PO Take 1 capsule by mouth 2  (two) times daily.    Marland Kitchen VITAMIN D, CHOLECALCIFEROL, PO Take 2,000 Units by mouth daily.      No current facility-administered medications for this visit.     Allergies:   Adhesive [tape]    Social History:  The patient  reports that she has never smoked. She has never used smokeless tobacco. She reports that she drinks alcohol. She reports that she does not use drugs.   Family History:  The patient's family history includes Asthma in her brother; Breast cancer in her mother; CAD in her brother and father; Cancer in her brother and father; Heart disease in her father; Hypertension in her brother and father.    ROS:  Please see the history of present illness.   Otherwise, review of systems are positive for none.   All other systems are reviewed and negative.    PHYSICAL EXAM: VS:  BP 106/80   Pulse (!) 59   Ht 5\' 6"  (1.676 m)   Wt (!) 303 lb (137.4 kg)   BMI 48.91 kg/m  , BMI Body mass index is 48.91 kg/m. GENERAL:  Well appearing HEENT: Pupils equal round and reactive, fundi not visualized, oral mucosa unremarkable NECK:  No jugular venous distention, waveform within normal limits, carotid upstroke brisk and symmetric, R carotid bruit LUNGS:  Clear to auscultation bilaterally HEART:  RRR.  PMI not displaced or sustained,S1 and S2 within normal limits, no S3, no S4, no clicks, no rubs, no murmurs ABD:  Flat, positive bowel sounds normal in frequency in pitch, no bruits, no rebound, no guarding, no midline pulsatile mass, no hepatomegaly, no splenomegaly EXT:  2 plus pulses throughout, no edema, no cyanosis no clubbing SKIN:  No rashes no nodules NEURO:  Cranial nerves II through XII grossly intact, motor grossly intact throughout PSYCH:  Cognitively intact, oriented to person place and time  EKG:  EKG is ordered today. 12/29/17: Sinus bradycardia.  Rate 59 bpm.    Cardiac CT-A 01/16/16: Aortic Valve: Trileaflet, normal thickness, no calcifications. Coronary Arteries: Normal  coronary origin. Right dominance. Left main is a large artery with no plaque. LAD is a large caliber artery that gives rise to 3 small diagonal branches and wraps around the apex. There is no plaque. 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 LCX is a medium caliber vessel that gives rise to two small OM branches, there is no plaque. RCA is a large caliber that gives rise to PDA and PLA. There is no plaque. Other findings: Dilated pulmonary artery measuring 36 x 31 mm suggestive of pulmonary hypertension. Normal pulmonary vein drainage into the left atrium. A large left atrial appendage without evidence of a thrombus. IMPRESSION: 1. Coronary calcium score of 0. This  was 0 percentile for age and sex matched control. 2. Normal coronary origin with right dominance. 3. No evidence of CAD. 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 4. Dilated pulmonary artery suggestive of pulmonary hypertension.  IMPRESSION: 1. Evidence of probable air trapping in the visualize lung bases, suggesting small airways disease. 2. Ectasia of the ascending thoracic aorta (4.2 cm in diameter).   Echo 02/06/16:  Study Conclusions  - Left ventricle: The cavity size was normal. Wall thickness was  increased in a pattern of moderate LVH. Systolic function was  normal. The estimated ejection fraction was in the range of 60%  to 65%. Wall motion was normal; there were no regional wall  motion abnormalities. Left ventricular diastolic function  parameters were normal. - Aortic valve: Trileaflet. There was no stenosis. There was  trivial regurgitation. - Ascending aorta: The ascending aorta was dilated to 4 cm. - Mitral valve: Mildly thickened leaflets . There was trivial  regurgitation. - Left atrium: The atrium was normal in size. - Right ventricle: The cavity size was mildly dilated. Systolic  function is reduced. - Right atrium: The atrium was mildly  dilated. - Tricuspid valve: There was trivial regurgitation. - Pulmonary arteries: The main pulmonary artery was not  well-visualized. PA peak pressure: 37 mm Hg (S). - Systemic veins: The IVC measures <2.1 cm, but does not collapse  >50%, suggesting an elevated RA pressure of 8 mmHg.  Impressions:  - LVEF 60-65%, moderate LVH, normal wall motion, normal diastolic  function, normal LA size, mildly dilated ascending aorta to 4.0  cm. Mild RAE and RVE with reduced RV systolic function, trivial  TR, RVSP 37 mmHg, mildly elevated RA pressure of 8 mmHg. Echo 01/2017: Study Conclusions  - Left ventricle: The cavity size was normal. Wall thickness was   increased in a pattern of mild LVH. Systolic function was normal.   The estimated ejection fraction was in the range of 55% to 60%.   Wall motion was normal; there were no regional wall motion   abnormalities. Left ventricular diastolic function parameters   were normal. - Aortic valve: There was mild regurgitation. - Left atrium: The atrium was mildly dilated. - Atrial septum: No defect or patent foramen ovale was identified.  24 Hour Holter Monitor 09/07/16:  Quality: Fair.  Baseline artifact. Predominant rhythm: sinus rhythm Average heart rate: 66 bpm Max heart rate: 119 bpm Min heart rate: 45 bpm  Frequent PVCs (1.4%) Ventricular trigeminy noted Occasional PACs   Recent Labs: 10/26/2017: ALT 22; BUN 19; Creatinine, Ser 0.75; Hemoglobin 13.4; Platelets 243.0; Potassium 4.7; Sodium 141; TSH 2.37    Lipid Panel    Component Value Date/Time   CHOL 148 10/26/2017 0902   TRIG 73.0 10/26/2017 0902   HDL 48.40 10/26/2017 0902   CHOLHDL 3 10/26/2017 0902   VLDL 14.6 10/26/2017 0902   LDLCALC 85 10/26/2017 0902      Wt Readings from Last 3 Encounters:  12/29/17 (!) 303 lb (137.4 kg)  10/26/17 (!) 319 lb (144.7 kg)  06/30/17 295 lb (133.8 kg)      ASSESSMENT AND PLAN:  # Atypical chest pain: Resolved.  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  Her coronary calcium score was 0.  Her ASCVD 10 year risk is 3.4%.  Aspirin is not indicated.    # Hypertension: BP well controlled.  She notes that it was elevated at some other appointments.  She will check this at home and  call if her blood pressure is running greater than 130/80.  Continue metoprolol.  # Obesity: Virginia Matthews was congratulated on her exercise and weight loss.  # Carotid bruit: No carotid stenosis on carotid Doppler.   # Ascending thoracic aortic aneurysm: Stable.  Repeat echo 01/2019.  Current medicines are reviewed at length with the patient today.  The patient does not have concerns regarding medicines.  The following changes have been made:  no change  Labs/ tests ordered today include:   Orders Placed This Encounter  Procedures  . EKG 12-Lead  . ECHOCARDIOGRAM COMPLETE     Disposition:   FU with Fallen Crisostomo C. Oval Linsey, MD, [facility] in 1 year    Signed, Andy Moye C. Oval Linsey, MD, [facility]  12/29/2017 8:52 AM    [facility]

## 2018-03-28 DIAGNOSIS — Z0100 Encounter for examination of eyes and vision without abnormal findings: Secondary | ICD-10-CM | POA: Diagnosis not present

## 2018-04-18 ENCOUNTER — Other Ambulatory Visit: Payer: Self-pay | Admitting: Family Medicine

## 2018-05-10 ENCOUNTER — Ambulatory Visit (INDEPENDENT_AMBULATORY_CARE_PROVIDER_SITE_OTHER): Payer: 59 | Admitting: Family Medicine

## 2018-05-10 ENCOUNTER — Other Ambulatory Visit: Payer: Self-pay

## 2018-05-10 ENCOUNTER — Encounter: Payer: Self-pay | Admitting: Family Medicine

## 2018-05-10 VITALS — BP 110/78 | HR 56 | Temp 98.1°F | Resp 16 | Ht 66.0 in | Wt 294.1 lb

## 2018-05-10 DIAGNOSIS — L4050 Arthropathic psoriasis, unspecified: Secondary | ICD-10-CM | POA: Diagnosis not present

## 2018-05-10 DIAGNOSIS — Z0000 Encounter for general adult medical examination without abnormal findings: Secondary | ICD-10-CM | POA: Diagnosis not present

## 2018-05-10 LAB — CBC WITH DIFFERENTIAL/PLATELET
Basophils Absolute: 0.1 10*3/uL (ref 0.0–0.1)
Basophils Relative: 1 % (ref 0.0–3.0)
Eosinophils Absolute: 0.4 10*3/uL (ref 0.0–0.7)
Eosinophils Relative: 6 % — ABNORMAL HIGH (ref 0.0–5.0)
HCT: 37.8 % (ref 36.0–46.0)
Hemoglobin: 12.8 g/dL (ref 12.0–15.0)
Lymphocytes Relative: 25.4 % (ref 12.0–46.0)
Lymphs Abs: 1.9 10*3/uL (ref 0.7–4.0)
MCHC: 33.9 g/dL (ref 30.0–36.0)
MCV: 90.1 fl (ref 78.0–100.0)
Monocytes Absolute: 0.5 10*3/uL (ref 0.1–1.0)
Monocytes Relative: 6.7 % (ref 3.0–12.0)
Neutro Abs: 4.5 10*3/uL (ref 1.4–7.7)
Neutrophils Relative %: 60.9 % (ref 43.0–77.0)
Platelets: 241 10*3/uL (ref 150.0–400.0)
RBC: 4.19 Mil/uL (ref 3.87–5.11)
RDW: 14 % (ref 11.5–15.5)
WBC: 7.4 10*3/uL (ref 4.0–10.5)

## 2018-05-10 LAB — LIPID PANEL
Cholesterol: 149 mg/dL (ref 0–200)
HDL: 55.7 mg/dL (ref >39.00)
LDL Cholesterol: 77 mg/dL (ref 0–99)
NonHDL: 93.61
Total CHOL/HDL Ratio: 3
Triglycerides: 85 mg/dL (ref 0.0–149.0)
VLDL: 17 mg/dL (ref 0.0–40.0)

## 2018-05-10 LAB — VITAMIN D 25 HYDROXY (VIT D DEFICIENCY, FRACTURES): VITD: 41.73 ng/mL (ref 30.00–100.00)

## 2018-05-10 LAB — HEPATIC FUNCTION PANEL
ALT: 17 U/L (ref 0–35)
AST: 20 U/L (ref 0–37)
Albumin: 4.1 g/dL (ref 3.5–5.2)
Alkaline Phosphatase: 80 U/L (ref 39–117)
Bilirubin, Direct: 0.1 mg/dL (ref 0.0–0.3)
Total Bilirubin: 0.6 mg/dL (ref 0.2–1.2)
Total Protein: 7.4 g/dL (ref 6.0–8.3)

## 2018-05-10 LAB — TSH: TSH: 1.96 u[IU]/mL (ref 0.35–4.50)

## 2018-05-10 LAB — BASIC METABOLIC PANEL
BUN: 19 mg/dL (ref 6–23)
CO2: 32 mEq/L (ref 19–32)
Calcium: 10 mg/dL (ref 8.4–10.5)
Chloride: 100 mEq/L (ref 96–112)
Creatinine, Ser: 0.82 mg/dL (ref 0.40–1.20)
GFR: 75.06 mL/min (ref >60.00)
Glucose, Bld: 98 mg/dL (ref 70–99)
Potassium: 4.5 mEq/L (ref 3.5–5.1)
Sodium: 138 mEq/L (ref 135–145)

## 2018-05-10 NOTE — Progress Notes (Signed)
   Subjective:    Patient ID: Virginia Matthews, female    DOB: 17-Aug-1956, 62 y.o.   MRN: [id number]  HPI CPE- UTD on pap, due for mammo.  UTD on colonoscopy.  UTD on Tdap.   Review of Systems Patient reports no vision/ hearing changes, adenopathy,fever, weight change,  persistant/recurrent hoarseness , swallowing issues, chest pain, edema, persistant/recurrent cough, hemoptysis, dyspnea (rest/exertional/paroxysmal nocturnal), gastrointestinal bleeding (melena, rectal bleeding), abdominal pain, significant heartburn, bowel changes, GU symptoms (dysuria, hematuria, incontinence), Gyn symptoms (abnormal  bleeding, pain),  syncope, focal weakness, memory loss, numbness & tingling, skin/hair/nail changes, abnormal bruising or bleeding, anxiety, or depression.   + palpitations- following w/ Dr Oval Linsey    Objective:   Physical Exam General Appearance:    Alert, cooperative, no distress, appears stated age  Head:    Normocephalic, without obvious abnormality, atraumatic  Eyes:    PERRL, conjunctiva/corneas clear, EOM's intact, fundi    benign, both eyes  Ears:    Normal TM's and external ear canals, both ears  Nose:   Nares normal, septum midline, mucosa normal, no drainage    or sinus tenderness  Throat:   Lips, mucosa, and tongue normal; teeth and gums normal  Neck:   Supple, symmetrical, trachea midline, no adenopathy;    Thyroid: no enlargement/tenderness/nodules  Back:     Symmetric, no curvature, ROM normal, no CVA tenderness  Lungs:     Clear to auscultation bilaterally, respirations unlabored  Chest Wall:    No tenderness or deformity   Heart:    Regular rate and rhythm, S1 and S2 normal, no murmur, rub   or gallop  Breast Exam:    Deferred to mammo  Abdomen:     Soft, non-tender, bowel sounds active all four quadrants,    no masses, no organomegaly  Genitalia:    Deferred to GYN  Rectal:    Extremities:   Extremities normal, atraumatic, no cyanosis or edema  Pulses:   2+  and symmetric all extremities  Skin:   Skin color, texture, turgor normal, no rashes or lesions  Lymph nodes:   Cervical, supraclavicular, and axillary nodes normal  Neurologic:   CNII-XII intact, normal strength, sensation and reflexes    throughout          Assessment & Plan:

## 2018-05-10 NOTE — Assessment & Plan Note (Signed)
Following w/ Dr Trudie Reed.  On Humira weekly.

## 2018-05-10 NOTE — Assessment & Plan Note (Signed)
Ongoing issue for pt.  She is down 9 lbs since last visit- applauded her efforts.  Check labs to risk stratify.  Will follow.

## 2018-05-10 NOTE — Assessment & Plan Note (Signed)
Pt's PE unchanged from previous and WNL w/ exception of obesity.  UTD on pap, colonoscopy.  Due for mammo- pt to schedule.  Check labs.  Anticipatory guidance provided.

## 2018-05-10 NOTE — Patient Instructions (Addendum)
Follow up in 6 months to recheck BP We'll notify you of your lab results and make any changes if needed Continue to work on healthy diet and regular exercise- you can do it! SCHEDULE YOUR MAMMOGRAM!!! Call with any questions or concerns Enjoy the rest of your summer!!!

## 2018-05-11 ENCOUNTER — Encounter: Payer: Self-pay | Admitting: General Practice

## 2018-05-16 ENCOUNTER — Encounter: Payer: 59 | Admitting: Obstetrics & Gynecology

## 2018-05-25 DIAGNOSIS — L400 Psoriasis vulgaris: Secondary | ICD-10-CM | POA: Diagnosis not present

## 2018-06-06 ENCOUNTER — Other Ambulatory Visit: Payer: Self-pay | Admitting: Cardiovascular Disease

## 2018-06-06 DIAGNOSIS — R002 Palpitations: Secondary | ICD-10-CM

## 2018-06-28 DIAGNOSIS — Z6841 Body Mass Index (BMI) 40.0 and over, adult: Secondary | ICD-10-CM | POA: Diagnosis not present

## 2018-06-28 DIAGNOSIS — R768 Other specified abnormal immunological findings in serum: Secondary | ICD-10-CM | POA: Diagnosis not present

## 2018-06-28 DIAGNOSIS — L4050 Arthropathic psoriasis, unspecified: Secondary | ICD-10-CM | POA: Diagnosis not present

## 2018-06-28 DIAGNOSIS — Z79899 Other long term (current) drug therapy: Secondary | ICD-10-CM | POA: Diagnosis not present

## 2018-06-28 DIAGNOSIS — M170 Bilateral primary osteoarthritis of knee: Secondary | ICD-10-CM | POA: Diagnosis not present

## 2018-06-28 DIAGNOSIS — L409 Psoriasis, unspecified: Secondary | ICD-10-CM | POA: Diagnosis not present

## 2018-08-18 ENCOUNTER — Encounter: Payer: 59 | Admitting: Obstetrics & Gynecology

## 2018-08-19 ENCOUNTER — Other Ambulatory Visit: Payer: Self-pay | Admitting: Family Medicine

## 2018-09-30 ENCOUNTER — Other Ambulatory Visit: Payer: Self-pay | Admitting: Pharmacist

## 2018-09-30 MED ORDER — ADALIMUMAB 40 MG/0.4ML ~~LOC~~ AJKT: 1.0000 "pen " | AUTO-INJECTOR | SUBCUTANEOUS | 6 refills | Status: DC

## 2018-10-12 ENCOUNTER — Other Ambulatory Visit: Payer: Self-pay | Admitting: Family Medicine

## 2018-10-12 DIAGNOSIS — Z1231 Encounter for screening mammogram for malignant neoplasm of breast: Secondary | ICD-10-CM

## 2018-10-13 ENCOUNTER — Ambulatory Visit
Admission: RE | Admit: 2018-10-13 | Discharge: 2018-10-13 | Disposition: A | Discharge status: Home / Self Care | Payer: 59 | Source: Ambulatory Visit

## 2018-10-13 DIAGNOSIS — Z1231 Encounter for screening mammogram for malignant neoplasm of breast: Secondary | ICD-10-CM

## 2018-10-17 ENCOUNTER — Other Ambulatory Visit: Payer: Self-pay | Admitting: Family Medicine

## 2018-11-09 ENCOUNTER — Ambulatory Visit (INDEPENDENT_AMBULATORY_CARE_PROVIDER_SITE_OTHER): Payer: 59 | Admitting: Family Medicine

## 2018-11-09 ENCOUNTER — Other Ambulatory Visit: Payer: Self-pay

## 2018-11-09 ENCOUNTER — Encounter: Payer: Self-pay | Admitting: General Practice

## 2018-11-09 ENCOUNTER — Encounter: Payer: Self-pay | Admitting: Family Medicine

## 2018-11-09 VITALS — BP 131/86 | HR 81 | Temp 98.1°F | Resp 17 | Ht 66.0 in | Wt 312.2 lb

## 2018-11-09 DIAGNOSIS — I10 Essential (primary) hypertension: Secondary | ICD-10-CM | POA: Diagnosis not present

## 2018-11-09 LAB — BASIC METABOLIC PANEL
BUN: 15 mg/dL (ref 6–23)
CO2: 25 mEq/L (ref 19–32)
Calcium: 10 mg/dL (ref 8.4–10.5)
Chloride: 102 mEq/L (ref 96–112)
Creatinine, Ser: 0.82 mg/dL (ref 0.40–1.20)
GFR: 70.51 mL/min (ref >60.00)
Glucose, Bld: 107 mg/dL — ABNORMAL HIGH (ref 70–99)
Potassium: 4.5 mEq/L (ref 3.5–5.1)
Sodium: 140 mEq/L (ref 135–145)

## 2018-11-09 LAB — CBC WITH DIFFERENTIAL/PLATELET
Basophils Absolute: 0.2 10*3/uL — ABNORMAL HIGH (ref 0.0–0.1)
Basophils Relative: 2 % (ref 0.0–3.0)
Eosinophils Absolute: 0.4 10*3/uL (ref 0.0–0.7)
Eosinophils Relative: 5.7 % — ABNORMAL HIGH (ref 0.0–5.0)
HCT: 39.4 % (ref 36.0–46.0)
Hemoglobin: 13.1 g/dL (ref 12.0–15.0)
Lymphocytes Relative: 23.8 % (ref 12.0–46.0)
Lymphs Abs: 1.8 10*3/uL (ref 0.7–4.0)
MCHC: 33.2 g/dL (ref 30.0–36.0)
MCV: 86.8 fl (ref 78.0–100.0)
Monocytes Absolute: 0.5 10*3/uL (ref 0.1–1.0)
Monocytes Relative: 6.8 % (ref 3.0–12.0)
Neutro Abs: 4.6 10*3/uL (ref 1.4–7.7)
Neutrophils Relative %: 61.7 % (ref 43.0–77.0)
Platelets: 244 10*3/uL (ref 150.0–400.0)
RBC: 4.54 Mil/uL (ref 3.87–5.11)
RDW: 14.3 % (ref 11.5–15.5)
WBC: 7.4 10*3/uL (ref 4.0–10.5)

## 2018-11-09 LAB — HEPATIC FUNCTION PANEL
ALT: 23 U/L (ref 0–35)
AST: 26 U/L (ref 0–37)
Albumin: 4.1 g/dL (ref 3.5–5.2)
Alkaline Phosphatase: 76 U/L (ref 39–117)
Bilirubin, Direct: 0.1 mg/dL (ref 0.0–0.3)
Total Bilirubin: 0.4 mg/dL (ref 0.2–1.2)
Total Protein: 7.3 g/dL (ref 6.0–8.3)

## 2018-11-09 LAB — TSH: TSH: 4.02 u[IU]/mL (ref 0.35–4.50)

## 2018-11-09 LAB — LIPID PANEL
Cholesterol: 165 mg/dL (ref 0–200)
HDL: 50.6 mg/dL (ref >39.00)
LDL Cholesterol: 97 mg/dL (ref 0–99)
NonHDL: 114.81
Total CHOL/HDL Ratio: 3
Triglycerides: 90 mg/dL (ref 0.0–149.0)
VLDL: 18 mg/dL (ref 0.0–40.0)

## 2018-11-09 LAB — HEMOGLOBIN A1C: Hgb A1c MFr Bld: 5.7 % (ref 4.6–6.5)

## 2018-11-09 NOTE — Patient Instructions (Signed)
Schedule your complete physical in 6 months We'll notify you of your lab results and make any changes if needed Really focus on healthy diet and regular exercise.  You can do it! Consider Weight Watchers or Noom to get started Call with any questions or concerns Happy Valentine's Day!!!

## 2018-11-09 NOTE — Progress Notes (Signed)
   Subjective:    Patient ID: Virginia Matthews, female    DOB: 07/22/1956, 63 y.o.   MRN: [id number]  HPI HTN- chronic problem, on Metoprolol 12.5mg  BID w/ adequate control.  Pt has recently given blood and BP was normal.  No CP, SOB above baseline, HAs, visual changes, edema.  Obesity- pt has gained 18 lbs since last visit.  Not exercising but plans to start.  'I got to'.  Not following a particular diet.  Has done [facility] in the past.  Feels the biggest issue for her is portion control.   Review of Systems For ROS see HPI     Objective:   Physical Exam Vitals signs reviewed.  Constitutional:      General: She is not in acute distress.    Appearance: She is well-developed. She is obese.  HENT:     Head: Normocephalic and atraumatic.  Eyes:     Conjunctiva/sclera: Conjunctivae normal.     Pupils: Pupils are equal, round, and reactive to light.  Neck:     Musculoskeletal: Normal range of motion and neck supple.     Thyroid: No thyromegaly.  Cardiovascular:     Rate and Rhythm: Normal rate and regular rhythm.     Heart sounds: Normal heart sounds. No murmur.  Pulmonary:     Effort: Pulmonary effort is normal. No respiratory distress.     Breath sounds: Normal breath sounds.  Abdominal:     General: There is no distension.     Palpations: Abdomen is soft.     Tenderness: There is no abdominal tenderness.  Lymphadenopathy:     Cervical: No cervical adenopathy.  Skin:    General: Skin is warm and dry.  Neurological:     Mental Status: She is alert and oriented to person, place, and time.  Psychiatric:        Behavior: Behavior normal.           Assessment & Plan:

## 2018-11-09 NOTE — Assessment & Plan Note (Signed)
Deteriorated.  Pt has gained 18 lbs since last visit.  Stressed need for healthy diet and regular exercise.  Discussed joining Weight Watchers or Noom as pt has strong emotional eating component (and Noom was developed by Psychologist).  Check labs to risk stratify.  Will follow closely.

## 2018-11-14 ENCOUNTER — Telehealth: Payer: 59 | Admitting: Family

## 2018-11-14 DIAGNOSIS — R6889 Other general symptoms and signs: Secondary | ICD-10-CM

## 2018-11-14 MED ORDER — OSELTAMIVIR PHOSPHATE 75 MG PO CAPS: 75.0000 mg | ORAL_CAPSULE | Freq: Two times a day (BID) | ORAL | 0 refills | Status: DC

## 2018-11-14 NOTE — Progress Notes (Signed)
E visit for Flu like symptoms   We are sorry that you are not feeling well.  Here is how we plan to help! Based on what you have shared with me it looks like you may have a respiratory virus that may be influenza.  Influenza or "the flu" is   an infection caused by a respiratory virus. The flu virus is highly contagious and persons who did not receive their yearly flu vaccination may "catch" the flu from close contact.  We have anti-viral medications to treat the viruses that cause this infection. They are not a "cure" and only shorten the course of the infection. These prescriptions are most effective when they are given within the first 2 days of "flu" symptoms. Antiviral medication are indicated if you have a high risk of complications from the flu. You should  also consider an antiviral medication if you are in close contact with someone who is at risk. These medications can help patients avoid complications from the flu  but have side effects that you should know. Possible side effects from Tamiflu or oseltamivir include nausea, vomiting, diarrhea, dizziness, headaches, eye redness, sleep problems or other respiratory symptoms. You should not take Tamiflu if you have an allergy to oseltamivir or any to the ingredients in Tamiflu.  Based upon your symptoms and potential risk factors I have prescribed Oseltamivir (Tamiflu).  It has been sent to your designated pharmacy.  You will take one 75 mg capsule orally twice a day for the next 5 days.   Approximately, 5 mins spent documenting and reviewing patient's chart.  ANYONE WHO HAS FLU SYMPTOMS SHOULD: . Stay home. The flu is highly contagious and going out or to work exposes others! . Be sure to drink plenty of fluids. Water is fine as well as fruit juices, sodas and electrolyte beverages. You may want to stay away from caffeine or alcohol. If you are nauseated, try taking small sips of liquids. How do you know if you are getting enough fluid? Your  urine should be a pale yellow or almost colorless. . Get rest. . Taking a steamy shower or using a humidifier may help nasal congestion and ease sore throat pain. Using a saline nasal spray works much the same way. . Cough drops, hard candies and sore throat lozenges may ease your cough. . Line up a caregiver. Have someone check on you regularly.   GET HELP RIGHT AWAY IF: . You cannot keep down liquids or your medications. . You become short of breath . Your fell like you are going to pass out or loose consciousness. . Your symptoms persist after you have completed your treatment plan MAKE SURE YOU   Understand these instructions.  Will watch your condition.  Will get help right away if you are not doing well or get worse.  Your e-visit answers were reviewed by a board certified advanced clinical practitioner to complete your personal care plan.  Depending on the condition, your plan could have included both over the counter or prescription medications.  If there is a problem please reply  once you have received a response from your provider.  Your safety is important to [place].  If you have drug allergies check your prescription carefully.    You can use MyChart to ask questions about today's visit, request a non-urgent call back, or ask for a work or school excuse for 24 hours related to this e-Visit. If it has been greater than 24 hours you will need  to follow up with your provider, or enter a new e-Visit to address those concerns.  You will get an e-mail in the next two days asking about your experience.  I hope that your e-visit has been valuable and will speed your recovery. Thank you for using e-visits.

## 2018-11-23 DIAGNOSIS — L814 Other melanin hyperpigmentation: Secondary | ICD-10-CM | POA: Diagnosis not present

## 2018-11-23 DIAGNOSIS — D2371 Other benign neoplasm of skin of right lower limb, including hip: Secondary | ICD-10-CM | POA: Diagnosis not present

## 2018-11-23 DIAGNOSIS — C44311 Basal cell carcinoma of skin of nose: Secondary | ICD-10-CM | POA: Diagnosis not present

## 2018-11-23 DIAGNOSIS — C44319 Basal cell carcinoma of skin of other parts of face: Secondary | ICD-10-CM | POA: Diagnosis not present

## 2018-11-23 DIAGNOSIS — D2261 Melanocytic nevi of right upper limb, including shoulder: Secondary | ICD-10-CM | POA: Diagnosis not present

## 2018-11-23 DIAGNOSIS — D2262 Melanocytic nevi of left upper limb, including shoulder: Secondary | ICD-10-CM | POA: Diagnosis not present

## 2018-11-23 DIAGNOSIS — D2272 Melanocytic nevi of left lower limb, including hip: Secondary | ICD-10-CM | POA: Diagnosis not present

## 2018-11-23 DIAGNOSIS — D485 Neoplasm of uncertain behavior of skin: Secondary | ICD-10-CM | POA: Diagnosis not present

## 2018-11-23 DIAGNOSIS — L400 Psoriasis vulgaris: Secondary | ICD-10-CM | POA: Diagnosis not present

## 2018-11-23 DIAGNOSIS — L918 Other hypertrophic disorders of the skin: Secondary | ICD-10-CM | POA: Diagnosis not present

## 2018-11-23 DIAGNOSIS — D225 Melanocytic nevi of trunk: Secondary | ICD-10-CM | POA: Diagnosis not present

## 2018-12-20 ENCOUNTER — Other Ambulatory Visit: Payer: Self-pay

## 2018-12-20 ENCOUNTER — Ambulatory Visit (INDEPENDENT_AMBULATORY_CARE_PROVIDER_SITE_OTHER): Payer: 59 | Admitting: Pharmacist

## 2018-12-20 DIAGNOSIS — Z79899 Other long term (current) drug therapy: Secondary | ICD-10-CM

## 2018-12-20 NOTE — Progress Notes (Signed)
   S: Patient presents today to the [facility] for review of her specialty medication..  Patient is currently taking Humira for psoriatic arthritis. Patient is managed by Dr. Trudie Reed for this.   Adherence: reports that she sometimes is late taking her doses. She also had to miss a dose earlier this year when she had the flu. She can tell when she is late on a dose because her hand will start to hurt.  Efficacy: reports that it is working well.  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 CBC: q3 months, have been normal at Dr. Trudie Reed' office. S/sx of hypersensitivity: denies S/sx of malignancy: denies S/sx of heart failure: denies    O:     Lab Results  Component Value Date   WBC 7.4 11/09/2018   HGB 13.1 11/09/2018   HCT 39.4 11/09/2018   MCV 86.8 11/09/2018   PLT 244.0 11/09/2018      Chemistry      Component Value Date/Time   NA 140 11/09/2018 0841   K 4.5 11/09/2018 0841   CL 102 11/09/2018 0841   CO2 25 11/09/2018 0841   BUN 15 11/09/2018 0841   CREATININE 0.82 11/09/2018 0841   CREATININE 0.77 12/31/2015 0901      Component Value Date/Time   CALCIUM 10.0 11/09/2018 0841   ALKPHOS 76 11/09/2018 0841   AST 26 11/09/2018 0841   ALT 23 11/09/2018 0841   BILITOT 0.4 11/09/2018 0841       A/P: 1. Medication review: Patient on Humira for psoriatic arthritis and is tolerating it well with no adverse effects and with improved control of her psoriatic arthritis. Reviewed the medication with her, including the following: Humira is a TNF blocking agent indicated for ankylosing spondylitis, Crohn's disease, Hidradenitis suppurativa, psoriatic arthritis, plaque psoriasis,  ulcerative colitis, and uveitis. The most common adverse effects are infections, headache, and injection site reactions. There is the possibility of an increased risk of malignancy but it is not well understood if this increased risk is due to there medication or the disease state. There are rare cases of pancytopenia and aplastic anemia. No recommendations for changes. Discussed Humira and how it can cause an immunocompromised state so that if she can avoid direct patient care, to do so, but that if she has further concerns that she can address them with her doctor if it does look like she might have to work in direct patient care. Encouraged good infection prevention measures, especially during [place] pandemic.   Christella Hartigan, PharmD, BCPS, BCACP, CPP Clinical Pharmacist Practitioner  [phone]

## 2019-02-13 ENCOUNTER — Telehealth (HOSPITAL_COMMUNITY): Payer: Self-pay

## 2019-02-13 NOTE — Telephone Encounter (Signed)

## 2019-02-15 ENCOUNTER — Other Ambulatory Visit: Payer: Self-pay

## 2019-02-15 ENCOUNTER — Ambulatory Visit (HOSPITAL_COMMUNITY): Payer: 59 | Attending: Internal Medicine

## 2019-02-15 DIAGNOSIS — R002 Palpitations: Secondary | ICD-10-CM | POA: Insufficient documentation

## 2019-02-15 DIAGNOSIS — I7121 Aneurysm of the ascending aorta, without rupture: Secondary | ICD-10-CM

## 2019-02-15 DIAGNOSIS — I712 Thoracic aortic aneurysm, without rupture: Secondary | ICD-10-CM | POA: Diagnosis not present

## 2019-02-16 ENCOUNTER — Telehealth: Payer: 59 | Admitting: Cardiovascular Disease

## 2019-02-21 ENCOUNTER — Telehealth: Payer: 59 | Admitting: Cardiovascular Disease

## 2019-02-24 ENCOUNTER — Other Ambulatory Visit: Payer: Self-pay

## 2019-02-24 DIAGNOSIS — I712 Thoracic aortic aneurysm, without rupture: Secondary | ICD-10-CM

## 2019-02-24 DIAGNOSIS — R002 Palpitations: Secondary | ICD-10-CM

## 2019-02-24 DIAGNOSIS — I7121 Aneurysm of the ascending aorta, without rupture: Secondary | ICD-10-CM

## 2019-02-24 NOTE — Progress Notes (Signed)
Notes recorded by Skeet Latch, MD on 02/23/2019 at 4:06 PM EDT Heart is squeezing well but doesn't relax completely. This can cause shortness of breath and swelling. It will be important to keep her BP well-controlled to prevent that from getting any worse. Ascending aorta is mildly dilated. Repeat echo in one year. R00.2 Palpitations  I71.2 Ascending Aortic Aneurysm

## 2019-02-28 DIAGNOSIS — C44311 Basal cell carcinoma of skin of nose: Secondary | ICD-10-CM | POA: Diagnosis not present

## 2019-04-21 ENCOUNTER — Other Ambulatory Visit: Payer: Self-pay | Admitting: Family Medicine

## 2019-05-03 ENCOUNTER — Other Ambulatory Visit: Payer: Self-pay | Admitting: Pharmacist

## 2019-05-03 DIAGNOSIS — L4050 Arthropathic psoriasis, unspecified: Secondary | ICD-10-CM | POA: Diagnosis not present

## 2019-05-03 DIAGNOSIS — M170 Bilateral primary osteoarthritis of knee: Secondary | ICD-10-CM | POA: Diagnosis not present

## 2019-05-03 DIAGNOSIS — L409 Psoriasis, unspecified: Secondary | ICD-10-CM | POA: Diagnosis not present

## 2019-05-03 DIAGNOSIS — R768 Other specified abnormal immunological findings in serum: Secondary | ICD-10-CM | POA: Diagnosis not present

## 2019-05-03 DIAGNOSIS — Z79899 Other long term (current) drug therapy: Secondary | ICD-10-CM | POA: Diagnosis not present

## 2019-05-03 MED ORDER — HUMIRA (2 PEN) 40 MG/0.4ML ~~LOC~~ AJKT: 1.0000 "pen " | AUTO-INJECTOR | SUBCUTANEOUS | 1 refills | Status: DC

## 2019-05-18 ENCOUNTER — Encounter: Payer: 59 | Admitting: Family Medicine

## 2019-05-19 ENCOUNTER — Other Ambulatory Visit: Payer: Self-pay | Admitting: Family Medicine

## 2019-08-21 ENCOUNTER — Other Ambulatory Visit: Payer: Self-pay | Admitting: Family Medicine

## 2019-09-01 ENCOUNTER — Ambulatory Visit: Payer: 59 | Admitting: Cardiovascular Disease

## 2019-09-08 ENCOUNTER — Other Ambulatory Visit: Payer: Self-pay | Admitting: Cardiovascular Disease

## 2019-09-08 DIAGNOSIS — R002 Palpitations: Secondary | ICD-10-CM

## 2019-09-12 NOTE — Telephone Encounter (Signed)
Rx has been sent to the pharmacy electronically. ° °

## 2019-09-13 ENCOUNTER — Encounter: Payer: 59 | Admitting: Family Medicine

## 2019-10-21 ENCOUNTER — Other Ambulatory Visit: Payer: Self-pay | Admitting: Family Medicine

## 2019-10-27 ENCOUNTER — Encounter: Payer: Self-pay | Admitting: Cardiovascular Disease

## 2019-10-27 ENCOUNTER — Ambulatory Visit (INDEPENDENT_AMBULATORY_CARE_PROVIDER_SITE_OTHER): Payer: 59 | Admitting: Cardiovascular Disease

## 2019-10-27 ENCOUNTER — Other Ambulatory Visit: Payer: Self-pay

## 2019-10-27 VITALS — BP 154/84 | HR 67 | Ht 66.0 in | Wt 320.0 lb

## 2019-10-27 DIAGNOSIS — Z5181 Encounter for therapeutic drug level monitoring: Secondary | ICD-10-CM | POA: Diagnosis not present

## 2019-10-27 DIAGNOSIS — I517 Cardiomegaly: Secondary | ICD-10-CM | POA: Diagnosis not present

## 2019-10-27 DIAGNOSIS — I712 Thoracic aortic aneurysm, without rupture: Secondary | ICD-10-CM | POA: Diagnosis not present

## 2019-10-27 DIAGNOSIS — I10 Essential (primary) hypertension: Secondary | ICD-10-CM

## 2019-10-27 DIAGNOSIS — R002 Palpitations: Secondary | ICD-10-CM

## 2019-10-27 DIAGNOSIS — I7121 Aneurysm of the ascending aorta, without rupture: Secondary | ICD-10-CM

## 2019-10-27 MED ORDER — LISINOPRIL 20 MG PO TABS: 20.0000 mg | ORAL_TABLET | Freq: Every day | ORAL | 3 refills | Status: DC

## 2019-10-27 NOTE — Patient Instructions (Signed)
Medication Instructions:  STOP METOPROLOL   START LISINOPRIL 20 MG DAILY   *If you need a refill on your cardiac medications before your next appointment, please call your pharmacy*  Lab Work: FASTING LP/CMET IN 1 WEEK   If you have labs (blood work) drawn today and your tests are completely normal, you will receive your results only by: Marland Kitchen MyChart Message (if you have MyChart) OR . A paper copy in the mail If you have any lab test that is abnormal or we need to change your treatment, we will call you to review the results.  Testing/Procedures: Your physician has requested that you have an echocardiogram. Echocardiography is a painless test that uses sound waves to create images of your heart. It provides your doctor with information about the size and shape of your heart and how well your heart's chambers and valves are working. This procedure takes approximately one hour. There are no restrictions for this procedure. [facility] [address] TO BE DONE IN MAY   Follow-Up: At [facility], you and your health needs are our priority.  As part of our continuing mission to provide you with exceptional heart care, we have created designated Provider Care Teams.  These Care Teams include your primary Cardiologist (physician) and Advanced Practice Providers (APPs -  Physician Assistants and Nurse Practitioners) who all work together to provide you with the care you need, when you need it.  Your next appointment:   4-6  week(s)  The format for your next appointment:   Either In Person or Virtual  Provider:   You may see DR [facility]  or one of the following Advanced Practice Providers on your designated Care Team:    Kerin Ransom, PA-C  [place], [place]  Coletta Memos, [place]

## 2019-10-27 NOTE — Progress Notes (Signed)
Cardiology Office Note   Date:  11/06/2019   ID:  VERLINA SHUBIN, DOB 04/17/1956, MRN [id number]  PCP:  Midge Minium, MD  Cardiologist:   Skeet Latch, MD   No chief complaint on file.    History of Present Illness: Virginia Matthews is a 64 y.o. female with hypertension and morbid obesity who presents for follow up.  [name] initially presented 12/2015 with atypical chest pain that occurred in stressful situations.  She also has a family history of premature CAD.  Therefore she was referred for cardiac CT angiography and coronary calcium scoring.  Her coronary calcium score was 0 and there were no obstructive lesions.  It showed a dilated pulmonary artery suggestive of pulmonary hypertension and her ascending thoracic aortic was 4.2 cm.  She was referred for echocardiography that revealed PASP 37 mmHg and was otherwise unremarkable.   She followed up with Almyra Deforest, PA, on 07/2016 for palpitations.  She wore a 24-hour Holter 08/2016 that revealed frequent PVCs and occasional PACs.  She was started on metoprolol.  Since her last appointment Virginia Matthews has been under a lot of stress.  Her husband had a stroke.  Soon after discharge she had a pulmonary embolism.  He is now doing better but she continues to be his primary caregiver.  Her father also passed away.  Prior to his death she spent a lot of time caring for him as well.  She has had to move because her home was not suitable for her husband after his stroke.  She has not been able to take much time to care for herself.  She is not exercising.  She notices that her exercise tolerance is much worse lately.  She gets exertional dyspnea but has no chest pain.  She has lower extremity edema at the end of the day but denies orthopnea or PND.  She does not regularly check her blood pressure at home.  In the past she took lisinopril.  This was stopped and switched to metoprolol for palpitation control.  She  has been on Lexapro for anxiety and has not noted any palpitations lately.  She also reports insomnia.  She has difficulty falling asleep.  Past Medical History:  Diagnosis Date  . Breast mass, left 06/2016  . Carpal tunnel syndrome on both sides   . GERD (gastroesophageal reflux disease)   . Heart murmur   . Hypertension    states under control with med., has been on med. x 3-4 yr.  . LVH (left ventricular hypertrophy)    moderate, per echo 01/2016  . Obesity   . PONV (postoperative nausea and vomiting)   . Psoriatic arthritis ([place])   . Rosacea   . Thoracic ascending aortic aneurysm [facility])     Past Surgical History:  Procedure Laterality Date  . BREAST EXCISIONAL BIOPSY Left    benign  . COLONOSCOPY WITH PROPOFOL  09/12/2015  . LAPAROSCOPIC ENDOMETRIOSIS FULGURATION    . MICRODISCECTOMY LUMBAR Right 07/16/2011   L4-5  . PELVIC LAPAROSCOPY     DIAG LAP  . RADIOACTIVE SEED GUIDED EXCISIONAL BREAST BIOPSY Left 07/16/2016   Procedure: LEFT RADIOACTIVE SEED GUIDED EXCISIONAL BREAST BIOPSY;  Surgeon: Rolm Bookbinder, MD;  Location: [facility];  Service: General;  Laterality: Left;     Current Outpatient Medications  Medication Sig Dispense Refill  . Adalimumab (HUMIRA PEN) 40 MG/0.4ML PNKT Inject 1 pen into the skin once a week. 12 each 1  .  b complex vitamins tablet Take 1 tablet by mouth daily.    . calcipotriene-betamethasone (TACLONEX) ointment Apply topically daily.    Marland Kitchen escitalopram (LEXAPRO) 10 MG tablet TAKE 1 TABLET BY MOUTH ONCE DAILY. 90 tablet 1  . fluticasone (FLONASE) 50 MCG/ACT nasal spray Place 2 sprays into both nostrils daily. 16 g 3  . hydrocortisone valerate cream (WESTCORT) 0.2 %   3  . metroNIDAZOLE (METROGEL) 0.75 % gel   11  . Multiple Vitamins-Minerals (CENTRUM SILVER PO) Take 1 tablet by mouth daily. Use as directed     . Omega-3 Fatty Acids (FISH OIL PO) Take 1 capsule by mouth daily.    Marland Kitchen oseltamivir (TAMIFLU) 75 MG capsule Take 1  capsule (75 mg total) by mouth 2 (two) times daily. 10 capsule 0  . pantoprazole (PROTONIX) 40 MG tablet TAKE 1 TABLET BY MOUTH DAILY. 90 tablet 0  . TURMERIC PO Take 1 capsule by mouth 2 (two) times daily.    Marland Kitchen VITAMIN D, CHOLECALCIFEROL, PO Take 2,000 Units by mouth daily.     Marland Kitchen lisinopril (ZESTRIL) 20 MG tablet Take 1 tablet (20 mg total) by mouth daily. 90 tablet 3   No current facility-administered medications for this visit.    Allergies:   Adhesive [tape]    Social History:  The patient  reports that she has never smoked. She has never used smokeless tobacco. She reports current alcohol use. She reports that she does not use drugs.   Family History:  The patient's family history includes Asthma in her brother; Breast cancer in her mother; CAD in her brother and father; Cancer in her brother and father; Heart disease in her father; Hypertension in her brother and father.    ROS:  Please see the history of present illness.   Otherwise, review of systems are positive for none.   All other systems are reviewed and negative.    PHYSICAL EXAM: VS:  BP (!) 154/84   Pulse 67   Ht 5\' 6"  (1.676 m)   Wt (!) 320 lb (145.2 kg)   SpO2 94%   BMI 51.65 kg/m  , BMI Body mass index is 51.65 kg/m. GENERAL:  Well appearing HEENT: Pupils equal round and reactive, fundi not visualized, oral mucosa unremarkable NECK:  No jugular venous distention, waveform within normal limits, carotid upstroke brisk and symmetric, no bruits LUNGS:  Clear to auscultation bilaterally HEART:  RRR.  PMI not displaced or sustained,S1 and S2 within normal limits, no S3, no S4, no clicks, no rubs, no murmurs ABD:  Flat, positive bowel sounds normal in frequency in pitch, no bruits, no rebound, no guarding, no midline pulsatile mass, no hepatomegaly, no splenomegaly EXT:  2 plus pulses throughout, no edema, no cyanosis no clubbing SKIN:  No rashes no nodules NEURO:  Cranial nerves II through XII grossly intact, motor  grossly intact throughout PSYCH:  Cognitively intact, oriented to person place and time   EKG:  EKG is ordered today. 12/29/17: Sinus bradycardia.  Rate 59 bpm.   10/27/19: Sinus rhythm.  Rate 67 bpm  Cardiac CT-A 01/16/16: Aortic Valve: Trileaflet, normal thickness, no calcifications. Coronary Arteries: Normal coronary origin. Right dominance. Left main is a large artery with no plaque. LAD is a large caliber artery that gives rise to 3 small diagonal branches and wraps around the apex. There is no plaque. 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 LCX is a medium caliber vessel that gives rise to two small OM  branches, there is no plaque. RCA is a large caliber that gives rise to PDA and PLA. There is no plaque. Other findings: Dilated pulmonary artery measuring 36 x 31 mm suggestive of pulmonary hypertension. Normal pulmonary vein drainage into the left atrium. A large left atrial appendage without evidence of a thrombus. IMPRESSION: 1. Coronary calcium score of 0. This was 0 percentile for age and sex matched control. 2. Normal coronary origin with right dominance. 3. No evidence of CAD. 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 4. Dilated pulmonary artery suggestive of pulmonary hypertension.  IMPRESSION: 1. Evidence of probable air trapping in the visualize lung bases, suggesting small airways disease. 2. Ectasia of the ascending thoracic aorta (4.2 cm in diameter).   Echo 02/06/16:  Study Conclusions  - Left ventricle: The cavity size was normal. Wall thickness was  increased in a pattern of moderate LVH. Systolic function was  normal. The estimated ejection fraction was in the range of 60%  to 65%. Wall motion was normal; there were no regional wall  motion abnormalities. Left ventricular diastolic function  parameters were normal. - Aortic valve: Trileaflet. There was no stenosis. There was  trivial  regurgitation. - Ascending aorta: The ascending aorta was dilated to 4 cm. - Mitral valve: Mildly thickened leaflets . There was trivial  regurgitation. - Left atrium: The atrium was normal in size. - Right ventricle: The cavity size was mildly dilated. Systolic  function is reduced. - Right atrium: The atrium was mildly dilated. - Tricuspid valve: There was trivial regurgitation. - Pulmonary arteries: The main pulmonary artery was not  well-visualized. PA peak pressure: 37 mm Hg (S). - Systemic veins: The IVC measures <2.1 cm, but does not collapse  >50%, suggesting an elevated RA pressure of 8 mmHg.  Impressions:  - LVEF 60-65%, moderate LVH, normal wall motion, normal diastolic  function, normal LA size, mildly dilated ascending aorta to 4.0  cm. Mild RAE and RVE with reduced RV systolic function, trivial  TR, RVSP 37 mmHg, mildly elevated RA pressure of 8 mmHg. Echo 01/2017: Study Conclusions  - Left ventricle: The cavity size was normal. Wall thickness was   increased in a pattern of mild LVH. Systolic function was normal.   The estimated ejection fraction was in the range of 55% to 60%.   Wall motion was normal; there were no regional wall motion   abnormalities. Left ventricular diastolic function parameters   were normal. - Aortic valve: There was mild regurgitation. - Left atrium: The atrium was mildly dilated. - Atrial septum: No defect or patent foramen ovale was identified.  24 Hour Holter Monitor 09/07/16:  Quality: Fair.  Baseline artifact. Predominant rhythm: sinus rhythm Average heart rate: 66 bpm Max heart rate: 119 bpm Min heart rate: 45 bpm  Frequent PVCs (1.4%) Ventricular trigeminy noted Occasional PACs   Recent Labs: 11/09/2018: ALT 23; BUN 15; Creatinine, Ser 0.82; Hemoglobin 13.1; Platelets 244.0; Potassium 4.5; Sodium 140; TSH 4.02    Lipid Panel    Component Value Date/Time   CHOL 165 11/09/2018 0841   TRIG 90.0 11/09/2018 0841    HDL 50.60 11/09/2018 0841   CHOLHDL 3 11/09/2018 0841   VLDL 18.0 11/09/2018 0841   LDLCALC 97 11/09/2018 0841      Wt Readings from Last 3 Encounters:  10/27/19 (!) 320 lb (145.2 kg)  11/09/18 (!) 312 lb 4 oz (141.6 kg)  05/10/18 294 lb 2 oz (133.4 kg)  ASSESSMENT AND PLAN:  # Atypical chest pain: Resolved.  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  Her coronary calcium score was 0.  Her ASCVD 10 year risk is 3.4%.  Aspirin is not indicated.    # Hypertension: BP is not controlled.  This is likely due to weight gain and physical inactivity.  She is going to get back into her exercise routine.  I encouraged her to sign 20 minutes daily for herself.  Stop metoprolol as her palpitations are now better controlled since her anxiety has been controlled.  Start lisinopril 20 mg daily.  Check a CMP in 1 week.  # Morbid obesity: [name] was encouraged to get back into her exercise routine. 150 minute weekly.  Check fasting lipids/cmp.  # Carotid bruit: No carotid stenosis on carotid Doppler.   # Ascending thoracic aortic aneurysm: 4.5 cm 01/2019.  Repeat echo 01/2020.  Current medicines are reviewed at length with the patient today.  The patient does not have concerns regarding medicines.  The following changes have been made:  stop metoprolol.  Start lisinopril.  Labs/ tests ordered today include:   Orders Placed This Encounter  Procedures  . Lipid panel  . Comprehensive metabolic panel  . EKG 12-Lead     Disposition:   FU with Virginia Swords C. Oval Linsey, MD, [facility] in 4-6 weeks.   Signed, Virginia Berrocal C. Oval Linsey, MD, [facility]  11/06/2019 5:34 PM    [facility]

## 2019-11-10 ENCOUNTER — Encounter: Payer: Self-pay | Admitting: Family Medicine

## 2019-11-10 ENCOUNTER — Ambulatory Visit (INDEPENDENT_AMBULATORY_CARE_PROVIDER_SITE_OTHER): Payer: 59 | Admitting: Family Medicine

## 2019-11-10 ENCOUNTER — Other Ambulatory Visit: Payer: Self-pay

## 2019-11-10 ENCOUNTER — Encounter: Payer: Self-pay | Admitting: General Practice

## 2019-11-10 VITALS — BP 124/84 | HR 77 | Temp 97.9°F | Resp 16 | Ht 66.0 in | Wt 321.0 lb

## 2019-11-10 DIAGNOSIS — Z0000 Encounter for general adult medical examination without abnormal findings: Secondary | ICD-10-CM | POA: Diagnosis not present

## 2019-11-10 LAB — LIPID PANEL
Cholesterol: 151 mg/dL (ref 0–200)
HDL: 50.7 mg/dL (ref >39.00)
LDL Cholesterol: 87 mg/dL (ref 0–99)
NonHDL: 100.04
Total CHOL/HDL Ratio: 3
Triglycerides: 66 mg/dL (ref 0.0–149.0)
VLDL: 13.2 mg/dL (ref 0.0–40.0)

## 2019-11-10 LAB — HEPATIC FUNCTION PANEL
ALT: 27 U/L (ref 0–35)
AST: 27 U/L (ref 0–37)
Albumin: 3.9 g/dL (ref 3.5–5.2)
Alkaline Phosphatase: 78 U/L (ref 39–117)
Bilirubin, Direct: 0.1 mg/dL (ref 0.0–0.3)
Total Bilirubin: 0.4 mg/dL (ref 0.2–1.2)
Total Protein: 7.1 g/dL (ref 6.0–8.3)

## 2019-11-10 LAB — CBC WITH DIFFERENTIAL/PLATELET
Basophils Absolute: 0.1 10*3/uL (ref 0.0–0.1)
Basophils Relative: 1.4 % (ref 0.0–3.0)
Eosinophils Absolute: 0.4 10*3/uL (ref 0.0–0.7)
Eosinophils Relative: 4.2 % (ref 0.0–5.0)
HCT: 39.5 % (ref 36.0–46.0)
Hemoglobin: 13.2 g/dL (ref 12.0–15.0)
Lymphocytes Relative: 16.5 % (ref 12.0–46.0)
Lymphs Abs: 1.4 10*3/uL (ref 0.7–4.0)
MCHC: 33.3 g/dL (ref 30.0–36.0)
MCV: 90.1 fl (ref 78.0–100.0)
Monocytes Absolute: 0.5 10*3/uL (ref 0.1–1.0)
Monocytes Relative: 6.3 % (ref 3.0–12.0)
Neutro Abs: 5.9 10*3/uL (ref 1.4–7.7)
Neutrophils Relative %: 71.6 % (ref 43.0–77.0)
Platelets: 182 10*3/uL (ref 150.0–400.0)
RBC: 4.39 Mil/uL (ref 3.87–5.11)
RDW: 13.8 % (ref 11.5–15.5)
WBC: 8.3 10*3/uL (ref 4.0–10.5)

## 2019-11-10 LAB — BASIC METABOLIC PANEL
BUN: 20 mg/dL (ref 6–23)
CO2: 28 mEq/L (ref 19–32)
Calcium: 9.2 mg/dL (ref 8.4–10.5)
Chloride: 105 mEq/L (ref 96–112)
Creatinine, Ser: 0.74 mg/dL (ref 0.40–1.20)
GFR: 79.12 mL/min (ref >60.00)
Glucose, Bld: 109 mg/dL — ABNORMAL HIGH (ref 70–99)
Potassium: 4.4 mEq/L (ref 3.5–5.1)
Sodium: 140 mEq/L (ref 135–145)

## 2019-11-10 LAB — TSH: TSH: 2.72 u[IU]/mL (ref 0.35–4.50)

## 2019-11-10 NOTE — Patient Instructions (Signed)
Follow up in 6 months to recheck BP and weight loss progress We'll notify you of your lab results and make any changes if needed Continue to work on healthy diet and add some form of daily activity- you can do it! Schedule your mammo at your convenience Call with any questions or concerns Stay Safe!  Stay Healthy!

## 2019-11-10 NOTE — Assessment & Plan Note (Signed)
Ongoing issue.  Pt has gained 9 lbs since last visit.  Stressed need for healthy diet and regular exercise.  Check labs to risk stratify.  Will follow.

## 2019-11-10 NOTE — Assessment & Plan Note (Signed)
Pt's PE WNL w/ exception of obesity.  UTD on colonoscopy, pap, immunizations.  Due for mammo.  Pt to schedule.  Check labs.  Anticipatory guidance provided.

## 2019-11-10 NOTE — Progress Notes (Signed)
   Subjective:    Patient ID: Virginia Matthews, female    DOB: [birth year], 64 y.o.   MRN: [id number]  HPI CPE- UTD on flu, Tdap, COVID.  UTD on pap, due for mammo.  Pt has gained 9 lbs since last visit   Review of Systems Patient reports no vision/ hearing changes, adenopathy,fever, persistant/recurrent hoarseness, swallowing issues, chest pain, palpitations, edema, persistant/recurrent cough, hemoptysis, gastrointestinal bleeding (melena, rectal bleeding), abdominal pain, significant heartburn, bowel changes, GU symptoms (dysuria, hematuria, incontinence), Gyn symptoms (abnormal  bleeding, pain),  syncope, focal weakness, memory loss, numbness & tingling, skin/hair/nail changes, abnormal bruising or bleeding, anxiety, or depression.   +SOB w/ exertion  This visit occurred during the SARS-CoV-2 public health emergency.  Safety protocols were in place, including screening questions prior to the visit, additional usage of staff PPE, and extensive cleaning of exam room while observing appropriate contact time as indicated for disinfecting solutions.       Objective:   Physical Exam General Appearance:    Alert, cooperative, no distress, appears stated age, obese  Head:    Normocephalic, without obvious abnormality, atraumatic  Eyes:    PERRL, conjunctiva/corneas clear, EOM's intact, fundi    benign, both eyes  Ears:    Normal TM's and external ear canals, both ears  Nose:   Deferred due to COVID  Throat:   Neck:   Supple, symmetrical, trachea midline, no adenopathy;    Thyroid: no enlargement/tenderness/nodules  Back:     Symmetric, no curvature, ROM normal, no CVA tenderness  Lungs:     Clear to auscultation bilaterally, respirations unlabored  Chest Wall:    No tenderness or deformity   Heart:    Regular rate and rhythm, S1 and S2 normal, no murmur, rub   or gallop  Breast Exam:    Deferred to GYN  Abdomen:     Soft, non-tender, bowel sounds active all four quadrants,    no  masses, no organomegaly  Genitalia:    Deferred to GYN  Rectal:    Extremities:   Extremities normal, atraumatic, no cyanosis or edema  Pulses:   2+ and symmetric all extremities  Skin:   Skin color, texture, turgor normal, no rashes or lesions  Lymph nodes:   Cervical, supraclavicular, and axillary nodes normal  Neurologic:   CNII-XII intact, normal strength, sensation and reflexes    throughout          Assessment & Plan:

## 2019-11-27 ENCOUNTER — Other Ambulatory Visit: Payer: Self-pay | Admitting: Family Medicine

## 2019-11-28 ENCOUNTER — Telehealth: Payer: 59 | Admitting: Cardiovascular Disease

## 2019-11-29 ENCOUNTER — Telehealth (INDEPENDENT_AMBULATORY_CARE_PROVIDER_SITE_OTHER): Payer: 59 | Admitting: Cardiovascular Disease

## 2019-11-29 ENCOUNTER — Encounter: Payer: Self-pay | Admitting: Cardiovascular Disease

## 2019-11-29 VITALS — BP 134/84 | HR 74 | Ht 66.0 in

## 2019-11-29 DIAGNOSIS — I517 Cardiomegaly: Secondary | ICD-10-CM | POA: Diagnosis not present

## 2019-11-29 DIAGNOSIS — I10 Essential (primary) hypertension: Secondary | ICD-10-CM | POA: Diagnosis not present

## 2019-11-29 DIAGNOSIS — I712 Thoracic aortic aneurysm, without rupture: Secondary | ICD-10-CM

## 2019-11-29 DIAGNOSIS — I7121 Aneurysm of the ascending aorta, without rupture: Secondary | ICD-10-CM

## 2019-11-29 NOTE — Progress Notes (Signed)
Virtual Visit via Telephone Note   This visit type was conducted due to national recommendations for restrictions regarding the COVID-19 Pandemic (e.g. social distancing) in an effort to limit this patient's exposure and mitigate transmission in our community.  Due to her co-morbid illnesses, this patient is at least at moderate risk for complications without adequate follow up.  This format is felt to be most appropriate for this patient at this time.  The patient did not have access to video technology/had technical difficulties with video requiring transitioning to audio format only (telephone).  All issues noted in this document were discussed and addressed.  No physical exam could be performed with this format.  Please refer to the patient's chart for her  consent to telehealth for [facility].   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  [name] initially presented 12/2015 with atypical chest pain that occurred in stressful situations.  She also has a family history of premature CAD.  Therefore she was referred for cardiac CT angiography and coronary calcium scoring.  Her coronary calcium score was 0 and there were no obstructive lesions.  It showed a dilated pulmonary artery suggestive of pulmonary hypertension and her ascending thoracic aortic was 4.2 cm.  She was referred for echocardiography that revealed PASP 37 mmHg and was otherwise unremarkable.   She followed up with Almyra Deforest, PA, on 07/2016 for palpitations.  She wore a 24-hour Holter  08/2016 that revealed frequent PVCs and occasional PACs.  She was started on metoprolol.  At her last appointment metoprolol was stopped because her palpitations were better controlled.  She was started on lisinopril due to poorly controlled blood pressure.  She hasn't been checking her blood pressure regularly but when she saw her primary care provider it was well-controlled.  She has not noticed any palpitations since stopping the metoprolol.  She feels a skipped beat every once in a while but nothing sustained.  She denies lower extremity edema, orthopnea, or PND.  She is trying to walk more.  She plans to retire this summer.  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  Retirement will also give her time to take care of her husband who recently had a stroke.  The patient does not have symptoms concerning for COVID-19 infection (fever, chills, cough, or new shortness of breath).    Past Medical History:  Diagnosis Date  . Breast mass, left 06/2016  . Carpal tunnel syndrome on both sides   . GERD (gastroesophageal reflux disease)   . Heart murmur   . Hypertension    states under control with med., has been on med. x 3-4 yr.  . LVH (left ventricular hypertrophy)    moderate, per echo 01/2016  . Obesity   . PONV (postoperative nausea and vomiting)   . Psoriatic arthritis ([place])   . Rosacea   . Thoracic ascending aortic aneurysm [facility])    Past Surgical History:  Procedure Laterality Date  . BREAST EXCISIONAL BIOPSY Left    benign  . COLONOSCOPY WITH PROPOFOL  09/12/2015  . LAPAROSCOPIC  ENDOMETRIOSIS FULGURATION    . MICRODISCECTOMY LUMBAR Right 07/16/2011   L4-5  . PELVIC LAPAROSCOPY     DIAG LAP  . RADIOACTIVE SEED GUIDED EXCISIONAL BREAST BIOPSY Left 07/16/2016   Procedure: LEFT RADIOACTIVE SEED GUIDED EXCISIONAL BREAST BIOPSY;  Surgeon: Rolm Bookbinder, MD;  Location: [place];  Service: General;  Laterality: Left;     Current Meds  Medication  Sig  . Adalimumab (HUMIRA PEN) 40 MG/0.4ML PNKT Inject 1 pen into the skin once a week.  Marland Kitchen b complex vitamins tablet Take 1 tablet by mouth daily.  . calcipotriene-betamethasone (TACLONEX) ointment Apply topically daily.  Marland Kitchen escitalopram (LEXAPRO) 10 MG tablet TAKE 1 TABLET BY MOUTH ONCE DAILY.  . fluticasone (FLONASE) 50 MCG/ACT nasal spray Place 2 sprays into both nostrils daily.  . hydrocortisone valerate cream (WESTCORT) 0.2 %   . lisinopril (ZESTRIL) 20 MG tablet Take 1 tablet (20 mg total) by mouth daily.  . metroNIDAZOLE (METROGEL) 0.75 % gel   . Multiple Vitamins-Minerals (CENTRUM SILVER PO) Take 1 tablet by mouth daily. Use as directed   . naproxen (NAPROSYN) 250 MG tablet Take by mouth daily.  . Omega-3 Fatty Acids (FISH OIL PO) Take 1 capsule by mouth daily.  . pantoprazole (PROTONIX) 40 MG tablet TAKE 1 TABLET BY MOUTH DAILY.  Marland Kitchen TURMERIC PO Take 1 capsule by mouth 2 (two) times daily.  Marland Kitchen VITAMIN D, CHOLECALCIFEROL, PO Take 2,000 Units by mouth daily.      Allergies:   Adhesive [tape]   Social History   Tobacco Use  . Smoking status: Never Smoker  . Smokeless tobacco: Never Used  Substance Use Topics  . Alcohol use: Yes    Alcohol/week: 0.0 standard drinks    Comment: socially  . Drug use: No     Family Hx: The patient's family history includes Asthma in her brother; Breast cancer in her mother; CAD in her brother and father; Cancer in her brother and father; Heart disease in her father; Hypertension in her brother and father.  ROS:   Please see the history of present illness.     All other systems reviewed and are negative.   Prior CV studies:   The following studies were reviewed today:  Cardiac CT-A 01/16/16: Aortic Valve: Trileaflet, normal thickness, no calcifications. Coronary Arteries: Normal coronary origin. Right dominance. Left main is a large artery with no plaque. LAD is a large caliber artery that gives rise to 3 small diagonal branches and  wraps around the apex. There is no plaque. 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 LCX is a medium caliber vessel that gives rise to two small OM branches, there is no plaque. RCA is a large caliber that gives rise to PDA and PLA. There is no plaque. Other findings: Dilated pulmonary artery measuring 36 x 31 mm suggestive of pulmonary hypertension. Normal pulmonary vein drainage into the left atrium. A large left atrial appendage without evidence of a thrombus. IMPRESSION: 1. Coronary calcium score of 0. This was 0 percentile for age and sex matched control. 2. Normal coronary origin with right dominance. 3. No evidence of CAD. 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 4. Dilated pulmonary artery suggestive of pulmonary hypertension.  IMPRESSION: 1. Evidence of probable air trapping in the visualize lung bases, suggesting small airways disease. 2. Ectasia of the ascending thoracic aorta (4.2 cm in diameter).   Echo 02/06/16:  Study Conclusions  - Left ventricle: The cavity  size was normal. Wall thickness was  increased in a pattern of moderate LVH. Systolic function was  normal. The estimated ejection fraction was in the range of 60%  to 65%. Wall motion was normal; there were no regional wall  motion abnormalities. Left ventricular diastolic function  parameters were normal. - Aortic valve: Trileaflet. There was no stenosis. There was  trivial regurgitation. - Ascending aorta: The ascending aorta was dilated to 4 cm. - Mitral valve: Mildly thickened leaflets . There was trivial  regurgitation. - Left atrium: The atrium was normal in size. - Right ventricle: The cavity size was mildly dilated. Systolic  function is reduced. - Right atrium: The atrium was mildly dilated. - Tricuspid valve: There was trivial regurgitation. - Pulmonary arteries: The main pulmonary artery was not  well-visualized. PA peak pressure: 37  mm Hg (S). - Systemic veins: The IVC measures <2.1 cm, but does not collapse  >50%, suggesting an elevated RA pressure of 8 mmHg.  Impressions:  - LVEF 60-65%, moderate LVH, normal wall motion, normal diastolic  function, normal LA size, mildly dilated ascending aorta to 4.0  cm. Mild RAE and RVE with reduced RV systolic function, trivial  TR, RVSP 37 mmHg, mildly elevated RA pressure of 8 mmHg. Echo 01/2017: Study Conclusions  - Left ventricle: The cavity size was normal. Wall thickness was increased in a pattern of mild LVH. Systolic function was normal. The estimated ejection fraction was in the range of 55% to 60%. Wall motion was normal; there were no regional wall motion abnormalities. Left ventricular diastolic function parameters were normal. - Aortic valve: There was mild regurgitation. - Left atrium: The atrium was mildly dilated. - Atrial septum: No defect or patent foramen ovale was identified.  24 Hour Holter Monitor 09/07/16:  Quality: Fair. Baseline artifact. Predominant rhythm: sinus rhythm Average heart rate: 66 bpm Max heart rate: 119 bpm Min heart rate: 45 bpm  Frequent PVCs (1.4%) Ventricular trigeminy noted Occasional PACs    Labs/Other Tests and Data Reviewed:    EKG:  No ECG reviewed.  Recent Labs: 11/10/2019: ALT 27; BUN 20; Creatinine, Ser 0.74; Hemoglobin 13.2; Platelets 182.0; Potassium 4.4; Sodium 140; TSH 2.72   Recent Lipid Panel Lab Results  Component Value Date/Time   CHOL 151 11/10/2019 09:41 AM   TRIG 66.0 11/10/2019 09:41 AM   HDL 50.70 11/10/2019 09:41 AM   CHOLHDL 3 11/10/2019 09:41 AM   LDLCALC 87 11/10/2019 09:41 AM    Wt Readings from Last 3 Encounters:  11/10/19 (!) 321 lb (145.6 kg)  10/27/19 (!) 320 lb (145.2 kg)  11/09/18 (!) 312 lb 4 oz (141.6 kg)     Objective:    Vital Signs:  BP 134/84   Pulse 74   Ht 5\' 6"  (1.676 m)   BMI 51.81 kg/m    VITAL SIGNS:  reviewed GEN:  no acute  distress EYES:  sclerae anicteric, EOMI - Extraocular Movements Intact RESPIRATORY:  normal respiratory effort, symmetric expansion CARDIOVASCULAR:  no peripheral edema SKIN:  no rash, lesions or ulcers. MUSCULOSKELETAL:  no obvious deformities. NEURO:  alert and oriented x 3, no obvious focal deficit PSYCH:  normal affect  ASSESSMENT & PLAN:    # Atypical chest pain: Resolved.  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  Her coronary calcium score was 0.  Her ASCVD 10 year risk is 3.4%.  Aspirin is not indicated.    # Hypertension: BP is much better controlled.  Her goal is less than 130/80  given her ascending aortic aneurysm.  She is very close.  We discussed trying to keep her blood pressure goal by increasing her exercise and watching sodium intake rather than increasing her medication.  # Morbid obesity: [name] was encouraged to get back into her exercise routine. 150 minute weekly.   # Carotid bruit: No carotid stenosis on carotid Doppler.   # Ascending thoracic aortic aneurysm: 4.5 cm 01/2019.  Repeat echo 01/2020.  COVID-19 Education: The signs and symptoms of COVID-19 were discussed with the patient and how to seek care for testing (follow up with PCP or arrange E-visit).  The importance of social distancing was discussed today.  Time:   Today, I have spent 12 minutes with the patient with telehealth technology discussing the above problems.     Medication Adjustments/Labs and Tests Ordered: Current medicines are reviewed at length with the patient today.  Concerns regarding medicines are outlined above.   Tests Ordered: No orders of the defined types were placed in this encounter.   Medication Changes: No orders of the defined types were placed in this encounter.   Follow Up:  In Person in 6 month(s)  Signed, Skeet Latch, MD  11/29/2019 8:43 AM    [place]

## 2019-11-29 NOTE — Patient Instructions (Signed)
Medication Instructions:  Your physician recommends that you continue on your current medications as directed. Please refer to the Current Medication list given to you today.  *If you need a refill on your cardiac medications before your next appointment, please call your pharmacy*  Lab Work: NONE   Testing/Procedures: NONE  Follow-Up: At [facility], you and your health needs are our priority.  As part of our continuing mission to provide you with exceptional heart care, we have created designated Provider Care Teams.  These Care Teams include your primary Cardiologist (physician) and Advanced Practice Providers (APPs -  Physician Assistants and Nurse Practitioners) who all work together to provide you with the care you need, when you need it.  We recommend signing up for the patient portal called "MyChart".  Sign up information is provided on this After Visit Summary.  MyChart is used to connect with patients for Virtual Visits (Telemedicine).  Patients are able to view lab/test results, encounter notes, upcoming appointments, etc.  Non-urgent messages can be sent to your provider as well.   To learn more about what you can do with MyChart, go to [email or web address].    Your next appointment:   6 month(s)  You will receive a reminder letter in the mail two months in advance. If you don't receive a letter, please call our office to schedule the follow-up appointment.  The format for your next appointment:   In Person  Provider:   You may see DR [place]  or one of the following Advanced Practice Providers on your designated Care Team:    Luke Kilroy, PA-C  Callie Goodrich, PA-C  Jesse Cleaver, FNP    

## 2019-12-15 ENCOUNTER — Encounter: Payer: Self-pay | Admitting: Pharmacist

## 2019-12-15 ENCOUNTER — Ambulatory Visit (HOSPITAL_BASED_OUTPATIENT_CLINIC_OR_DEPARTMENT_OTHER): Payer: 59 | Admitting: Pharmacist

## 2019-12-15 ENCOUNTER — Other Ambulatory Visit: Payer: Self-pay

## 2019-12-15 DIAGNOSIS — Z79899 Other long term (current) drug therapy: Secondary | ICD-10-CM

## 2019-12-15 NOTE — Progress Notes (Signed)
  S: Patient presents for review of their specialty medication therapy.  Patient is currently taking Humira for psoriatic arthritis. Patient is managed by Dr. Trudie Reed for this.    Adherence: reports stopping Humira for 6 weeks in order to take COVID vaccine. Has now resumed taking Humira  Efficacy: working well since resuming Humira  Dosing:  Psoriatic arthritis: SubQ: 40 mg every other week (may continue methotrexate, other nonbiologic DMARDS, corticosteroids, NSAIDs and/or analgesics)  Dose adjustments: Renal: no dose adjustments (has not been studied) Hepatic: no dose adjustments (has not been studied)  Drug-drug interactions: none  Screening: TB test: completed per patient Hepatitis: completed per patient  Monitoring: S/sx of infection: denies CBC: q6 months with Dr. Trudie Reed S/sx of hypersensitivity: denies S/sx of malignancy: denies S/sx of heart failure: denies  Other side effects: none  O:     Lab Results  Component Value Date   WBC 8.3 11/10/2019   HGB 13.2 11/10/2019   HCT 39.5 11/10/2019   MCV 90.1 11/10/2019   PLT 182.0 11/10/2019      Chemistry      Component Value Date/Time   NA 140 11/10/2019 0941   K 4.4 11/10/2019 0941   CL 105 11/10/2019 0941   CO2 28 11/10/2019 0941   BUN 20 11/10/2019 0941   CREATININE 0.74 11/10/2019 0941   CREATININE 0.77 12/31/2015 0901      Component Value Date/Time   CALCIUM 9.2 11/10/2019 0941   ALKPHOS 78 11/10/2019 0941   AST 27 11/10/2019 0941   ALT 27 11/10/2019 0941   BILITOT 0.4 11/10/2019 0941       A/P: 1. Medication review: Patient currently on Humira for psoriatic arthritis. Reviewed the medication with the patient, including the following: Humira is a TNF blocking agent indicated for ankylosing spondylitis, Crohn's disease, Hidradenitis suppurativa, psoriatic arthritis, plaque psoriasis, ulcerative colitis, and uveitis. Patient educated on purpose, proper use and potential adverse effects of Humira.  Possible adverse effects are increased risk of infections, headache, and injection site reactions. There is the possibility of an increased risk of malignancy but it is not well understood if this increased risk is due to there medication or the disease state. There are rare cases of pancytopenia and aplastic anemia. For SubQ injection at separate sites in the thigh or lower abdomen (avoiding areas within 2 inches of navel); rotate injection sites. May leave at room temperature for ~15 to 30 minutes prior to use; do not remove cap or cover while allowing product to reach room temperature. Do not use if solution is discolored or contains particulate matter. Do not administer to skin which is red, tender, bruised, hard, or that has scars, stretch marks, or psoriasis plaques. Needle cap of the prefilled syringe or needle cover for the adalimumab pen may contain latex. Prefilled pens and syringes are available for use by patients and the full amount of the syringe should be injected (self-administration); the vial is intended for institutional use only. Vials do not contain a preservative; discard unused portion. No recommendations for any changes at this time.  Lorel Monaco, PharmD PGY1 Ambulatory Care Resident [facility]

## 2020-01-29 DIAGNOSIS — R768 Other specified abnormal immunological findings in serum: Secondary | ICD-10-CM | POA: Diagnosis not present

## 2020-01-29 DIAGNOSIS — M170 Bilateral primary osteoarthritis of knee: Secondary | ICD-10-CM | POA: Diagnosis not present

## 2020-01-29 DIAGNOSIS — L4050 Arthropathic psoriasis, unspecified: Secondary | ICD-10-CM | POA: Diagnosis not present

## 2020-01-29 DIAGNOSIS — Z6841 Body Mass Index (BMI) 40.0 and over, adult: Secondary | ICD-10-CM | POA: Diagnosis not present

## 2020-01-29 DIAGNOSIS — L409 Psoriasis, unspecified: Secondary | ICD-10-CM | POA: Diagnosis not present

## 2020-01-31 ENCOUNTER — Other Ambulatory Visit: Payer: Self-pay | Admitting: Family Medicine

## 2020-01-31 DIAGNOSIS — Z1231 Encounter for screening mammogram for malignant neoplasm of breast: Secondary | ICD-10-CM

## 2020-02-02 ENCOUNTER — Ambulatory Visit
Admission: RE | Admit: 2020-02-02 | Discharge: 2020-02-02 | Disposition: A | Discharge status: Home / Self Care | Payer: 59 | Source: Ambulatory Visit

## 2020-02-02 ENCOUNTER — Other Ambulatory Visit: Payer: Self-pay

## 2020-02-02 DIAGNOSIS — Z1231 Encounter for screening mammogram for malignant neoplasm of breast: Secondary | ICD-10-CM

## 2020-02-05 ENCOUNTER — Other Ambulatory Visit: Payer: Self-pay | Admitting: Pharmacist

## 2020-02-05 MED ORDER — HUMIRA (2 PEN) 40 MG/0.4ML ~~LOC~~ AJKT: 1.0000 "pen " | AUTO-INJECTOR | SUBCUTANEOUS | 1 refills | Status: DC

## 2020-02-07 ENCOUNTER — Other Ambulatory Visit (HOSPITAL_COMMUNITY): Payer: Self-pay | Admitting: Dermatology

## 2020-02-07 DIAGNOSIS — L858 Other specified epidermal thickening: Secondary | ICD-10-CM | POA: Diagnosis not present

## 2020-02-07 DIAGNOSIS — L814 Other melanin hyperpigmentation: Secondary | ICD-10-CM | POA: Diagnosis not present

## 2020-02-07 DIAGNOSIS — L820 Inflamed seborrheic keratosis: Secondary | ICD-10-CM | POA: Diagnosis not present

## 2020-02-07 DIAGNOSIS — D2262 Melanocytic nevi of left upper limb, including shoulder: Secondary | ICD-10-CM | POA: Diagnosis not present

## 2020-02-07 DIAGNOSIS — L918 Other hypertrophic disorders of the skin: Secondary | ICD-10-CM | POA: Diagnosis not present

## 2020-02-07 DIAGNOSIS — D2271 Melanocytic nevi of right lower limb, including hip: Secondary | ICD-10-CM | POA: Diagnosis not present

## 2020-02-07 DIAGNOSIS — D224 Melanocytic nevi of scalp and neck: Secondary | ICD-10-CM | POA: Diagnosis not present

## 2020-02-07 DIAGNOSIS — L400 Psoriasis vulgaris: Secondary | ICD-10-CM | POA: Diagnosis not present

## 2020-02-07 DIAGNOSIS — Z85828 Personal history of other malignant neoplasm of skin: Secondary | ICD-10-CM | POA: Diagnosis not present

## 2020-02-09 ENCOUNTER — Other Ambulatory Visit: Payer: Self-pay

## 2020-02-09 ENCOUNTER — Ambulatory Visit (HOSPITAL_COMMUNITY): Payer: 59 | Attending: Cardiovascular Disease

## 2020-02-09 DIAGNOSIS — R002 Palpitations: Secondary | ICD-10-CM | POA: Diagnosis not present

## 2020-02-09 DIAGNOSIS — I712 Thoracic aortic aneurysm, without rupture: Secondary | ICD-10-CM | POA: Insufficient documentation

## 2020-02-09 DIAGNOSIS — I7121 Aneurysm of the ascending aorta, without rupture: Secondary | ICD-10-CM

## 2020-02-14 ENCOUNTER — Other Ambulatory Visit: Payer: Self-pay | Admitting: *Deleted

## 2020-02-14 DIAGNOSIS — I712 Thoracic aortic aneurysm, without rupture: Secondary | ICD-10-CM

## 2020-02-14 DIAGNOSIS — I359 Nonrheumatic aortic valve disorder, unspecified: Secondary | ICD-10-CM

## 2020-02-14 DIAGNOSIS — I7121 Aneurysm of the ascending aorta, without rupture: Secondary | ICD-10-CM

## 2020-02-14 DIAGNOSIS — I517 Cardiomegaly: Secondary | ICD-10-CM

## 2020-02-14 NOTE — Progress Notes (Signed)
1 Year follow up echo order and recall paced

## 2020-02-15 DIAGNOSIS — D485 Neoplasm of uncertain behavior of skin: Secondary | ICD-10-CM | POA: Diagnosis not present

## 2020-02-15 DIAGNOSIS — D2372 Other benign neoplasm of skin of left lower limb, including hip: Secondary | ICD-10-CM | POA: Diagnosis not present

## 2020-02-15 DIAGNOSIS — Z85828 Personal history of other malignant neoplasm of skin: Secondary | ICD-10-CM | POA: Diagnosis not present

## 2020-02-24 ENCOUNTER — Other Ambulatory Visit: Payer: Self-pay | Admitting: Family Medicine

## 2020-04-11 ENCOUNTER — Other Ambulatory Visit: Payer: Self-pay | Admitting: Family Medicine

## 2020-05-20 ENCOUNTER — Ambulatory Visit: Payer: 59 | Admitting: Family Medicine

## 2020-05-20 ENCOUNTER — Encounter: Payer: Self-pay | Admitting: Family Medicine

## 2020-05-20 ENCOUNTER — Other Ambulatory Visit: Payer: Self-pay

## 2020-05-20 VITALS — BP 123/83 | HR 82 | Temp 97.9°F | Resp 16 | Ht 66.0 in | Wt 299.4 lb

## 2020-05-20 DIAGNOSIS — I10 Essential (primary) hypertension: Secondary | ICD-10-CM | POA: Diagnosis not present

## 2020-05-20 LAB — CBC WITH DIFFERENTIAL/PLATELET
Basophils Absolute: 0.1 10*3/uL (ref 0.0–0.1)
Basophils Relative: 1.1 % (ref 0.0–3.0)
Eosinophils Absolute: 0.7 10*3/uL (ref 0.0–0.7)
Eosinophils Relative: 8.8 % — ABNORMAL HIGH (ref 0.0–5.0)
HCT: 39.6 % (ref 36.0–46.0)
Hemoglobin: 13.2 g/dL (ref 12.0–15.0)
Lymphocytes Relative: 17.6 % (ref 12.0–46.0)
Lymphs Abs: 1.3 10*3/uL (ref 0.7–4.0)
MCHC: 33.3 g/dL (ref 30.0–36.0)
MCV: 92.2 fl (ref 78.0–100.0)
Monocytes Absolute: 0.6 10*3/uL (ref 0.1–1.0)
Monocytes Relative: 7.4 % (ref 3.0–12.0)
Neutro Abs: 4.8 10*3/uL (ref 1.4–7.7)
Neutrophils Relative %: 65.1 % (ref 43.0–77.0)
Platelets: 151 10*3/uL (ref 150.0–400.0)
RBC: 4.29 Mil/uL (ref 3.87–5.11)
RDW: 14 % (ref 11.5–15.5)
WBC: 7.4 10*3/uL (ref 4.0–10.5)

## 2020-05-20 LAB — LIPID PANEL
Cholesterol: 165 mg/dL (ref 0–200)
HDL: 43.5 mg/dL (ref >39.00)
LDL Cholesterol: 102 mg/dL — ABNORMAL HIGH (ref 0–99)
NonHDL: 121.08
Total CHOL/HDL Ratio: 4
Triglycerides: 97 mg/dL (ref 0.0–149.0)
VLDL: 19.4 mg/dL (ref 0.0–40.0)

## 2020-05-20 LAB — BASIC METABOLIC PANEL
BUN: 16 mg/dL (ref 6–23)
CO2: 28 mEq/L (ref 19–32)
Calcium: 9.2 mg/dL (ref 8.4–10.5)
Chloride: 104 mEq/L (ref 96–112)
Creatinine, Ser: 0.75 mg/dL (ref 0.40–1.20)
GFR: 77.77 mL/min (ref >60.00)
Glucose, Bld: 100 mg/dL — ABNORMAL HIGH (ref 70–99)
Potassium: 4.2 mEq/L (ref 3.5–5.1)
Sodium: 140 mEq/L (ref 135–145)

## 2020-05-20 LAB — HEPATIC FUNCTION PANEL
ALT: 24 U/L (ref 0–35)
AST: 24 U/L (ref 0–37)
Albumin: 4 g/dL (ref 3.5–5.2)
Alkaline Phosphatase: 64 U/L (ref 39–117)
Bilirubin, Direct: 0.1 mg/dL (ref 0.0–0.3)
Total Bilirubin: 0.4 mg/dL (ref 0.2–1.2)
Total Protein: 6.9 g/dL (ref 6.0–8.3)

## 2020-05-20 LAB — TSH: TSH: 2.24 u[IU]/mL (ref 0.35–4.50)

## 2020-05-20 NOTE — Patient Instructions (Addendum)
Schedule your complete physical in 6 months We'll notify you of your lab results and make any changes if needed Continue to work on healthy diet and regular exercise- you're doing great!!! Call with any questions or concerns [employer] Ret[employer]! Stay Safe!  Stay Healthy!

## 2020-05-20 NOTE — Progress Notes (Signed)
   Subjective:    Patient ID: Virginia Matthews, female    DOB: 1956-07-30, 64 y.o.   MRN: [id number]  HPI HTN- chronic problem, on Lisinopril 20mg  w/ good control.  No CP, SOB above baseline (actually improving), HAs, visual changes, edema.  Obesity- pt has lost 22 lbs since last visit.  Pt has had more time since retirement.  Eating better, walking regularly.  Having difficulty w/ knee arthritis- particularly stairs   Review of Systems For ROS see HPI   This visit occurred during the SARS-CoV-2 public health emergency.  Safety protocols were in place, including screening questions prior to the visit, additional usage of staff PPE, and extensive cleaning of exam room while observing appropriate contact time as indicated for disinfecting solutions.       Objective:   Physical Exam Vitals reviewed.  Constitutional:      General: She is not in acute distress.    Appearance: She is well-developed. She is obese.  HENT:     Head: Normocephalic and atraumatic.  Eyes:     Conjunctiva/sclera: Conjunctivae normal.     Pupils: Pupils are equal, round, and reactive to light.  Neck:     Thyroid: No thyromegaly.  Cardiovascular:     Rate and Rhythm: Normal rate and regular rhythm.     Heart sounds: Normal heart sounds. No murmur heard.   Pulmonary:     Effort: Pulmonary effort is normal. No respiratory distress.     Breath sounds: Normal breath sounds.  Abdominal:     General: There is no distension.     Palpations: Abdomen is soft.     Tenderness: There is no abdominal tenderness.  Musculoskeletal:     Cervical back: Normal range of motion and neck supple.  Lymphadenopathy:     Cervical: No cervical adenopathy.  Skin:    General: Skin is warm and dry.  Neurological:     Mental Status: She is alert and oriented to person, place, and time.  Psychiatric:        Behavior: Behavior normal.           Assessment & Plan:

## 2020-05-20 NOTE — Assessment & Plan Note (Signed)
Chronic problem.  Currently well controlled.  Asymptomatic.  Check labs.  No anticipated med changes.  Will follow. 

## 2020-05-20 NOTE — Assessment & Plan Note (Signed)
Ongoing issue for pt.  She is down 22 lbs since last visit.  Applauded her efforts.  Check labs.  Will follow.

## 2020-05-21 ENCOUNTER — Other Ambulatory Visit: Payer: Self-pay | Admitting: Family Medicine

## 2020-05-21 ENCOUNTER — Encounter: Payer: Self-pay | Admitting: General Practice

## 2020-05-28 ENCOUNTER — Other Ambulatory Visit: Payer: Self-pay

## 2020-05-28 ENCOUNTER — Ambulatory Visit (INDEPENDENT_AMBULATORY_CARE_PROVIDER_SITE_OTHER): Payer: 59 | Admitting: Sports Medicine

## 2020-05-28 VITALS — BP 120/74 | Ht 66.0 in | Wt 292.0 lb

## 2020-05-28 DIAGNOSIS — M25512 Pain in left shoulder: Secondary | ICD-10-CM | POA: Diagnosis not present

## 2020-05-28 DIAGNOSIS — M25562 Pain in left knee: Secondary | ICD-10-CM

## 2020-05-28 DIAGNOSIS — M25561 Pain in right knee: Secondary | ICD-10-CM

## 2020-05-28 DIAGNOSIS — G8929 Other chronic pain: Secondary | ICD-10-CM

## 2020-05-28 MED ORDER — METHYLPREDNISOLONE ACETATE 40 MG/ML IJ SUSP
40.0000 mg | Freq: Once | INTRAMUSCULAR | Status: AC
  Administered 2020-05-28: 40 mg via INTRA_ARTICULAR

## 2020-05-28 NOTE — Progress Notes (Signed)
Office Visit Note   Patient: Virginia Matthews           Date of Birth: [birth year]           MRN: [id number] Visit Date: 05/28/2020 Requested by: Midge Minium, MD [address] [place],  [place] [address] PCP: Midge Minium, MD  Subjective: CC: Bilateral knee pain; Left shoulder pain  HPI: Patient is a 64 year old female presenting to clinic today to follow-up on chronic bilateral knee pain, as well as acute left shoulder pain. Per her knee pain, patient has a known history of tricompartmental osteoarthritis which she has struggled with for many years.  Typically, her symptoms are controlled with NSAIDs and low impact activity (she walks 1 mi up to 5 x weekly).  Recently, however, she returned from a trip with her husband and is noticed increasing pain in both of her knees (R>L).  She states her trip had many stairs, and going downstairs was particularly painful.  She denies any catching or locking of the knees, and denies any new trauma.  She states her knees did not swell on her.  Her knee pain has nearly resolved as of today (her home has no stairs), but she states that she wants to make sure that she is able to travel with her husband as they have both recently retired and they are hoping to go on several vacations within the next few months (including [place] in 2 weeks). Per her shoulder, patient denies any trauma or acute exacerbating event, but states for the past 2 weeks she has noticed intermittent left shoulder pain.  She has difficulty pinpointing exact activities that worsen her pain, but states she will occasionally notice discomfort when reaching for items, like drinks in the fridge.  Denies any particular pain with repetitive over head activities, but states she does not often have to participate in these.  She says that she is primarily concerned about her shoulder because she is the caregiver for her husband, and "if I go down, the whole ship goes down with  me."  She strongly wants to be proactive with tackling her shoulder pain before it becomes a bigger issue.  She states she has no pain today.              ROS:   All other systems were reviewed and are negative.  Objective: Vital Signs: BP 120/74   Ht 5\' 6"  (1.676 m)   Wt 292 lb (132.5 kg)   BMI 47.13 kg/m   Physical Exam:  General:  Alert and oriented, in no acute distress. Cardiac: Appears well perfused. Pulm:  Breathing unlabored. Psy:  Normal mood, congruent affect. Skin: Bilateral legs with dry skin, 2 cm dry plaque over lateral aspect of right knee.  No other skin lesions appreciated. Knee exam:  Normal gait.  No varus or valgus deformity of the knees.  Seated exam:  Bilateral knees with full range of motion in flexion and extension, moderate patellar crepitus appreciated bilaterally.  Palpation:  Trace effusion bilaterally. Mild tenderness to palpation over patellar facets, and with patellar compression-worse on the right.  Right sided medial joint line tenderness.  No lateral joint line tenderness bilaterally.  No patellar tendon tenderness bilaterally.  Ligamentous exam: No pain or laxity with anterior or posterior drawer bilaterally.  No pain or laxity with varus or valgus stress across the knee.  Meniscal: McMurray's smooth, with no pain or clicking appreciated bilaterally.   Shoulder exam: Shoulders  with symmetrical muscle mass, no obvious deformity.  Full range of motion with bilateral shoulders throughout abduction, flexion, extension, internal and external rotation, and abduction without pain.  Apley scratch symmetrical at approximate level of T6.  Palpation:  No tenderness to palpation over bilateral AC joints, over bilateral bicipital tendon, over bilateral superior trapezius musculature, or subacromial areas.  Special tests:  Empty can with minimal pain on left (2 out of 10), though strength somewhat reduced when compared to right.  No pain or weakness with  belly press or liftoff.  No pain or weakness with speeds or O'Brien.  No pain or clicking with crank, or biceps load 2.  No pain with AC compression. Impingement:  No worsening of pain with Neer's or Hawkins bilaterally.    Imaging: No results found.  Assessment & Plan: 64 year old female presenting to clinic today with chronic bilateral knee pain, in the setting of known tricompartmental osteoarthritis.  Suspect flare of underlying arthritis due to increased activity while on vacation-specifically multiple flights of stairs at her vacation home.  -Given her upcoming vacation to [place], offered a steroid shot today into her right knee, as this is the more affected side.  Risks and benefits discussed and patient opted to proceed.  -Previously fit for custom orthotics, however these have aged.  May benefit from a new pair of orthotics.  Patient instructed to schedule an appointment prior to her upcoming vacation for refitting.  -Pending improvement from today's injection, if she decides to have her left knee injected as well she is welcome to schedule for this in clinic, or it can be done at the time of her orthotic fitting. -Patient is agreeable to physical therapy for quad strengthening and knee stabilization exercises, to help improve her overall knee health and hopefully reduce her osteoarthritis symptom burden.   Shoulder pain: Left shoulder pain difficult to reproduce on exam today, though suspect likely early rotator cuff tendinitis. -As patient is agreeable to physical therapy referral for her knees, will include her shoulder pain and this referral, to start with early shoulder physical therapy and rotator cuff strengthening.   -Patient is agreeable with plan and has no further questions or concerns today.      Procedures: Right Knee Cortisone Injection:  Risks and benefits or procedure discussed, Patient opted to proceed. Written Consent obtained.  Timeout performed.  Skin  prepped in a sterile fashion with alcohol. Ethyl Chloride was used for topical analgesia.  Right knee was injected with 3cc 1% Lidocaine without epinephrine and 40mg  methylprednisolone via the flexed medial approach, using a 25G, 1.5in needle.   Patient tolerated the injection well with no immediate complications. Aftercare instructions were discussed, and patient was given strict return precautions.    Patient seen and evaluated with the sports medicine fellow.  I agree with the above plan of care.  Patient's right knee was injected with cortisone as above.  Patient is referred to physical therapy for both knee osteoarthritis and left shoulder rotator cuff impingement.  She will follow-up next week for new custom orthotics.  If she finds today's cortisone injection to be beneficial, then we can consider injecting her left knee at that same visit.

## 2020-05-29 ENCOUNTER — Encounter: Payer: Self-pay | Admitting: Family Medicine

## 2020-05-31 DIAGNOSIS — L409 Psoriasis, unspecified: Secondary | ICD-10-CM | POA: Diagnosis not present

## 2020-05-31 DIAGNOSIS — M170 Bilateral primary osteoarthritis of knee: Secondary | ICD-10-CM | POA: Diagnosis not present

## 2020-05-31 DIAGNOSIS — Z79899 Other long term (current) drug therapy: Secondary | ICD-10-CM | POA: Diagnosis not present

## 2020-05-31 DIAGNOSIS — R768 Other specified abnormal immunological findings in serum: Secondary | ICD-10-CM | POA: Diagnosis not present

## 2020-05-31 DIAGNOSIS — Z6841 Body Mass Index (BMI) 40.0 and over, adult: Secondary | ICD-10-CM | POA: Diagnosis not present

## 2020-05-31 DIAGNOSIS — L4050 Arthropathic psoriasis, unspecified: Secondary | ICD-10-CM | POA: Diagnosis not present

## 2020-06-06 ENCOUNTER — Encounter: Payer: Self-pay | Admitting: Physical Therapy

## 2020-06-06 ENCOUNTER — Other Ambulatory Visit: Payer: Self-pay

## 2020-06-06 ENCOUNTER — Encounter: Payer: Self-pay | Admitting: Sports Medicine

## 2020-06-06 ENCOUNTER — Ambulatory Visit: Payer: 59 | Attending: Sports Medicine | Admitting: Physical Therapy

## 2020-06-06 ENCOUNTER — Ambulatory Visit (INDEPENDENT_AMBULATORY_CARE_PROVIDER_SITE_OTHER): Payer: 59 | Admitting: Sports Medicine

## 2020-06-06 VITALS — BP 133/68 | Ht 66.0 in | Wt 290.0 lb

## 2020-06-06 DIAGNOSIS — R2689 Other abnormalities of gait and mobility: Secondary | ICD-10-CM | POA: Diagnosis not present

## 2020-06-06 DIAGNOSIS — G8929 Other chronic pain: Secondary | ICD-10-CM | POA: Diagnosis not present

## 2020-06-06 DIAGNOSIS — M25561 Pain in right knee: Secondary | ICD-10-CM | POA: Diagnosis not present

## 2020-06-06 DIAGNOSIS — M25512 Pain in left shoulder: Secondary | ICD-10-CM | POA: Diagnosis not present

## 2020-06-06 DIAGNOSIS — M25562 Pain in left knee: Secondary | ICD-10-CM

## 2020-06-06 NOTE — Progress Notes (Signed)
[name] presents today for fitting of a custom orthotic in order to assist with her known bilateral knee degenerative joint disease.  Please see previous note for details on her bilateral osteoarthritis.  Patient was fitted for a : standard, cushioned, semi-rigid orthotic. The orthotic was heated and afterward the patient stood on the orthotic blank positioned on the orthotic stand. The patient was positioned in subtalar neutral position and 10 degrees of ankle dorsiflexion in a weight bearing stance. After completion of molding, a stable base was applied to the orthotic blank. The blank was ground to a stable position for weight bearing. Size: 9 Base: blue EVA Posting: none Additional orthotic padding: lateral heel wedge was placed bilaterally  Patient gait was evaluated after fitting of the custom orthotic in her walking shoes.  She had a neutral gait noted in the new orthotics and patient found them to be comfortable.  Of note, patient was seen recently and had an injection into her left knee joint, she reports that it was helpful.  We discussed that she can return for injection of her right knee as needed based on pain.  Also discussed that patient can return as needed for any revisions the need to be made on her custom orthotic.  Dagoberto Ligas, MD Sports Medicine Fellow, [place]  Patient seen and evaluated with the sports medicine fellow.  I agree with the above plan of care.  Roshanda found her new custom orthotics to be comfortable.  She did note that she did not feel as much pressure in the arch as her previous custom orthotics.  If this is an issue for her down the road, then we can add a scaphoid pad to the top of her custom orthotics.  She will follow-up as needed.

## 2020-06-07 ENCOUNTER — Ambulatory Visit: Payer: 59 | Admitting: Physical Therapy

## 2020-06-07 ENCOUNTER — Encounter: Payer: Self-pay | Admitting: Physical Therapy

## 2020-06-07 NOTE — Therapy (Signed)
[place], [place], [address] Phone: [phone]   Fax:  [phone]  Physical Therapy Evaluation  Patient Details  Name: Virginia Matthews MRN: [id number] Date of Birth: 1956-04-28 Referring Provider (PT): Dr Lilia Argue    Encounter Date: 06/06/2020   PT End of Session - 06/07/20 0905    Visit Number 1    Number of Visits 6    Date for PT Re-Evaluation 07/19/20    Authorization Type [facility] UMR    PT Start Time 1630    PT Stop Time 1712    PT Time Calculation (min) 42 min    Activity Tolerance Patient tolerated treatment well    Behavior During Therapy [facility] for tasks assessed/performed           Past Medical History:  Diagnosis Date  . Breast mass, left 06/2016  . Carpal tunnel syndrome on both sides   . GERD (gastroesophageal reflux disease)   . Heart murmur   . Hypertension    states under control with med., has been on med. x 3-4 yr.  . LVH (left ventricular hypertrophy)    moderate, per echo 01/2016  . Obesity   . PONV (postoperative nausea and vomiting)   . Psoriatic arthritis ([place])   . Rosacea   . Thoracic ascending aortic aneurysm [facility])     Past Surgical History:  Procedure Laterality Date  . BREAST EXCISIONAL BIOPSY Left    benign  . COLONOSCOPY WITH PROPOFOL  09/12/2015  . LAPAROSCOPIC ENDOMETRIOSIS FULGURATION    . MICRODISCECTOMY LUMBAR Right 07/16/2011   L4-5  . PELVIC LAPAROSCOPY     DIAG LAP  . RADIOACTIVE SEED GUIDED EXCISIONAL BREAST BIOPSY Left 07/16/2016   Procedure: LEFT RADIOACTIVE SEED GUIDED EXCISIONAL BREAST BIOPSY;  Surgeon: Rolm Bookbinder, MD;  Location: [facility];  Service: General;  Laterality: Left;    There were no vitals filed for this visit.    Subjective Assessment - 06/06/20 1635    Subjective Patient had an exacerbation of left knee pain while on vacation. She has a hisotry of OA in her knee. It has always been a problem but  now it has gotten worse. She is alos has pain in her left shoulder. She has pain in certain motions. She had a coritsone shot which helped her knee. It has improed sicne theshot.    Pertinent History bilateral carpal tunnel, left ventricular hypertrophy, OA Psoriatic arthritis    How long can you sit comfortably? No limit    How long can you stand comfortably? depdns on how flaired up the knee is    How long can you walk comfortably? walks for exercise 5 days a week    Diagnostic tests X-ray: 2:15 shows significant joint space narrowing    Patient Stated Goals to have less pain when she ambualtes.    Currently in Pain? Yes    Pain Score 2     Pain Location Knee    Pain Orientation Right    Pain Descriptors / Indicators Aching    Pain Type Chronic pain    Pain Onset 1 to 4 weeks ago    Pain Frequency Intermittent    Aggravating Factors  standing and walking    Pain Relieving Factors reviewed HEp and symptom managment    Effect of Pain on Daily Activities pain wlaking down steps    Multiple Pain Sites Yes    Pain Score 6    Pain Location Shoulder  Pain Orientation Left    Pain Descriptors / Indicators Aching    Pain Type Chronic pain    Pain Onset More than a month ago    Pain Frequency Intermittent    Aggravating Factors  reaching in certain angles    Pain Relieving Factors not reaching in those angles    Effect of Pain on Daily Activities pain reaching in certain directions              [facility] PT Assessment - 06/07/20 0001      Assessment   Medical Diagnosis Right Knee Pain     Referring Provider (PT) Dr Lilia Argue     Onset Date/Surgical Date --   Knee August 2021/ left shoulder 6 weeks    Hand Dominance Right    Next MD Visit as needed     Prior Therapy PT for her ankle in the past       Precautions   Precautions None      Restrictions   Weight Bearing Restrictions No      Balance Screen   Has the patient fallen in the past 6 months No    Has the patient  had a decrease in activity level because of a fear of falling?  No    Is the patient reluctant to leave their home because of a fear of falling?  No      Home Environment   Additional Comments No steps at her house. 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     Leisure Yoga, walking,        Cognition   Overall Cognitive Status Within Functional Limits for tasks assessed    Attention Focused    Focused Attention Appears intact    Memory Appears intact    Awareness Appears intact    Problem Solving Appears intact      Observation/Other Assessments   Focus on Therapeutic Outcomes (FOTO)  Knee 37% limited 32% expected shoulder 45% limited 33% expceted       Sensation   Light Touch Appears Intact      Coordination   Gross Motor Movements are Fluid and Coordinated Yes    Fine Motor Movements are Fluid and Coordinated Yes      AROM   Overall AROM Comments full active shoulder motion . Pain with end range IR. Full Knee ROM       PROM   Overall PROM Comments full passvie knee ROM       Strength   Strength Assessment Site Shoulder    Right/Left Shoulder Right;Left    Right Shoulder Flexion 5/5    Left Shoulder Flexion 4+/5    Left Shoulder Internal Rotation 4+/5    Left Shoulder External Rotation 4+/5      Palpation   Patella mobility limited superior and inferior mobility bilateral     Palpation comment no unexpected tenderness to palpation in the shoulder or knee       Ambulation/Gait   Gait Comments mild lateral pertabations with gait and decreased hip flexion but given her OA her gait is pretty good.                       Objective measurements completed on examination: See above findings.       [facility] Adult  PT Treatment/Exercise - 06/07/20 0001      Knee/Hip Exercises: Supine   Bridges Limitations 2x10     Straight  Leg Raises Limitations 2x10     Other Supine Knee/Hip Exercises supine clamshell 2x10 green       Shoulder Exercises: Standing   Other Standing Exercises Shoulder red extension 2x10     Other Standing Exercises Scap retraction 2x10 red                   PT Education - 06/07/20 0903    Education Details HEP and symptom mangement    Person(s) Educated Patient    Methods Explanation    Comprehension Verbalized understanding;Returned demonstration;Verbal cues required;Tactile cues required            PT Short Term Goals - 06/07/20 0924      PT SHORT TERM GOAL #1   Title thewrapy will review FOTO scores    Time 1    Period Weeks    Status New    Target Date 06/28/20      PT SHORT TERM GOAL #2   Title Patient will be indepdnent with basic HEP for psoterior chain UE strengthening and LE  stability strengthening    Time 3    Period Weeks    Status New    Target Date 06/28/20      PT SHORT TERM GOAL #3   Title Patient will demonstrate improved inferior and superior patella mobility    Time 3    Period Weeks    Status New    Target Date 06/28/20             PT Long Term Goals - 06/07/20 0928      PT LONG TERM GOAL #1   Title Patient will go down 4 steps without pain    Time 6    Period Weeks    Status New    Target Date 07/19/20      PT LONG TERM GOAL #2   Title Patient will reach to an overhead cabinet with a 2 lb object without pain    Time 6    Period Weeks    Status New    Target Date 07/19/20      PT LONG TERM GOAL #3   Title Patient will reach behind her back without pain    Time 6    Period Weeks    Status New    Target Date 07/19/20                  Plan - 06/07/20 9242    Clinical Impression Statement Patient is a 64 year old female who presents to PT with right knee pain and left shoulder pain. Her knee pain increases with long distance walking and going down steps. She has a history of bilateral knee OA. Her shoulder hurts when  reaching in certain positions. Sings and symptoms are consitent with impingement. She hads decreased inferior and superior patella mobility bilateral. She would benefit from skilled therapy to improve overall stability    Personal Factors and Comorbidities Comorbidity 1;Comorbidity 2    Comorbidities obesity, psoriatic arthrisi, OA    Examination-Activity Limitations Squat;Stairs;Stand;Locomotion Level    Examination-Participation Restrictions Community Activity;Shop;Cleaning    Stability/Clinical Decision Making Stable/Uncomplicated    Clinical Decision Making Low    Rehab Potential Good    PT Frequency 1x / week    PT Duration 6 weeks    PT Treatment/Interventions ADLs/Self Care  Home Management;Cryotherapy;Electrical Stimulation;Moist Heat;Iontophoresis 4mg /ml Dexamethasone;Ultrasound;DME Instruction;Gait training;Stair training;Functional mobility training;Therapeutic activities;Therapeutic exercise;Neuromuscular re-education;Patient/family education;Manual techniques;Passive range of motion;Taping    PT Next Visit Plan continue withpatella mobilizations; review HEP; add bilateral rotator cuff stability exercises; consider psoterio and inferior shoulder glides. Review standing exercises: [facility] for [facility] pool. modalities PRN    PT Home Exercise Plan bridge; SLR; clamshell; scpa retraction shoulder extension    Consulted and Agree with Plan of Care Patient           Patient will benefit from skilled therapeutic intervention in order to improve the following deficits and impairments:  Abnormal gait, Decreased activity tolerance, Pain, Decreased mobility, Impaired UE functional use  Visit Diagnosis: Chronic pain of right knee  Chronic left shoulder pain  Other abnormalities of gait and mobility     Problem List Patient Active Problem List   Diagnosis Date Noted  . Right knee pain 07/01/2017  . Tricompartmental disease of knee 04/13/2017  .  Psoriatic arthritis ([place]) 04/17/2016  . LVH (left ventricular hypertrophy) 02/11/2016  . Ascending aortic aneurysm ([place]) 02/11/2016  . Carotid bruit 12/31/2015  . 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 Left wrist pain 07/05/2015  . Bilateral hip pain 07/05/2015  . Leg size inequality 07/05/2015  . Right facial numbness 12/21/2013  . Anxiety and depression 05/13/2012  . Physical exam, routine 05/13/2012  . GERD (gastroesophageal reflux disease) 05/13/2012  . Seasonal allergies   . PCO (polycystic ovaries)   . Ruptured lumbar disc   . Endometriosis   . HYPERTENSION, BENIGN ESSENTIAL 05/01/2010  . NONSPEC ELEVATION OF LEVELS OF TRANSAMINASE/LDH 03/14/2009  . Severe obesity (BMI >= 40) ([place]) 02/11/2009  . RHINITIS 02/11/2009  . ABDOMINAL PAIN, UNSPECIFIED SITE 02/11/2009    Carney Living PT DPT  06/07/2020, 12:29 PM  [facility] [address] [place], [place], [address] Phone: [phone]   Fax:  [phone]  Name: Virginia Matthews MRN: [id number] Date of Birth: August 20, 1956

## 2020-06-10 ENCOUNTER — Ambulatory Visit: Payer: 59 | Attending: Internal Medicine

## 2020-06-10 DIAGNOSIS — Z23 Encounter for immunization: Secondary | ICD-10-CM

## 2020-06-10 NOTE — Progress Notes (Signed)
   Covid-19 Vaccination Clinic  Name:  Virginia Matthews    MRN: [id number] DOB: [birth year]  06/10/2020  Virginia Matthews was observed post Covid-19 immunization for 15 minutes without incident. She was provided with Vaccine Information Sheet and instruction to access the V-Safe system.   Virginia Matthews was instructed to call 911 with any severe reactions post vaccine: Marland Kitchen Difficulty breathing  . Swelling of face and throat  . A fast heartbeat  . A bad rash all over body  . Dizziness and weakness

## 2020-06-12 ENCOUNTER — Encounter: Payer: Self-pay | Admitting: Physical Therapy

## 2020-06-12 ENCOUNTER — Ambulatory Visit: Payer: 59 | Admitting: Physical Therapy

## 2020-06-12 ENCOUNTER — Other Ambulatory Visit: Payer: Self-pay

## 2020-06-12 DIAGNOSIS — R2689 Other abnormalities of gait and mobility: Secondary | ICD-10-CM | POA: Diagnosis not present

## 2020-06-12 DIAGNOSIS — M25512 Pain in left shoulder: Secondary | ICD-10-CM

## 2020-06-12 DIAGNOSIS — G8929 Other chronic pain: Secondary | ICD-10-CM | POA: Diagnosis not present

## 2020-06-12 DIAGNOSIS — M25561 Pain in right knee: Secondary | ICD-10-CM | POA: Diagnosis not present

## 2020-06-12 NOTE — Patient Instructions (Signed)
Access Code: [email or web address]: [email or web address]: 09/15/2021Prepared by: Anderson Malta PaaExercises  Supine Bridge - 1 x daily - 7 x weekly - 3 sets - 10 reps  Supine Active Straight Leg Raise - 1 x daily - 7 x weekly - 3 sets - 10 reps  Seated Hip Abduction with Resistance - 1 x daily - 7 x weekly - 3 sets - 10 reps  4 Way Patellar Glide - 1 x daily - 7 x weekly - 3 sets - 10 reps  Shoulder extension with resistance - Neutral - 1 x daily - 7 x weekly - 3 sets - 10 reps  Scapular Retraction with Resistance - 1 x daily - 7 x weekly - 3 sets - 10 reps  Straight Leg Raise with External Rotation - 1 x daily - 7 x weekly - 2 sets - 10 reps - 5 hold  Supine Shoulder External Rotation with Resistance - 1 x daily - 7 x weekly - 2 sets - 10 reps - 5 hold

## 2020-06-12 NOTE — Therapy (Signed)
[place], [place], [address] Phone: [phone]   Fax:  [phone]  Physical Therapy Treatment  Patient Details  Name: Virginia Matthews MRN: [id number] Date of Birth: Dec 15, 1955 Referring Provider (PT): Dr Lilia Argue    Encounter Date: 06/12/2020   PT End of Session - 06/12/20 1047    Visit Number 2    Number of Visits 6    Date for PT Re-Evaluation 07/19/20    Authorization Type [facility] U[facility]    PT Start Time 1000    PT Stop Time 1050    PT Time Calculation (min) 50 min    Activity Tolerance Patient tolerated treatment well    Behavior During Therapy [facility] for tasks assessed/performed           Past Medical History:  Diagnosis Date  . Breast mass, left 06/2016  . Carpal tunnel syndrome on both sides   . GERD (gastroesophageal reflux disease)   . Heart murmur   . Hypertension    states under control with med., has been on med. x 3-4 yr.  . LVH (left ventricular hypertrophy)    moderate, per echo 01/2016  . Obesity   . PONV (postoperative nausea and vomiting)   . Psoriatic arthritis ([place])   . Rosacea   . Thoracic ascending aortic aneurysm [facility])     Past Surgical History:  Procedure Laterality Date  . BREAST EXCISIONAL BIOPSY Left    benign  . COLONOSCOPY WITH PROPOFOL  09/12/2015  . LAPAROSCOPIC ENDOMETRIOSIS FULGURATION    . MICRODISCECTOMY LUMBAR Right 07/16/2011   L4-5  . PELVIC LAPAROSCOPY     DIAG LAP  . RADIOACTIVE SEED GUIDED EXCISIONAL BREAST BIOPSY Left 07/16/2016   Procedure: LEFT RADIOACTIVE SEED GUIDED EXCISIONAL BREAST BIOPSY;  Surgeon: Rolm Bookbinder, MD;  Location: [place];  Service: General;  Laterality: Left;    There were no vitals filed for this visit.   Subjective Assessment - 06/12/20 1007    Subjective Rt knee worse, Lt shoulder.  She got a booster Monday! She is generally stiff due to psoriatic arthritis. Has a question about the  patellar mobilzations    Currently in Pain? Yes    Pain Score 1     Pain Location Generalized             [facility] Adult PT Treatment/Exercise - 06/12/20 0001      Knee/Hip Exercises: Aerobic   Nustep L5 UE and LE for 6 min       Knee/Hip Exercises: Supine   Bridges Limitations x 10 with green band     Straight Leg Raises Both;2 sets;10 reps    Straight Leg Raise with External Rotation Both;2 sets;10 reps    Patellar Mobs shown with leg extended on table     Other Supine Knee/Hip Exercises supine clamshell 2x10 green       Shoulder Exercises: Standing   External Rotation Strengthening;Left;15 reps    Theraband Level (Shoulder External Rotation) Level 2 (Red)    Extension Strengthening;Both;15 reps;Theraband    Theraband Level (Shoulder Extension) Level 3 (Green)    Row Strengthening;Both;15 reps    Theraband Level (Shoulder Row) Level 3 (Green)      Shoulder Exercises: ROM/Strengthening   Wall Pushups 10 reps    Wall Pushups Limitations 2 sets, 1 set triceps     Other ROM/Strengthening Exercises mini squat hold on wall with yellow T band horizontal pull x 10  Modalities   Modalities Cryotherapy      Cryotherapy   Number Minutes Cryotherapy 8 Minutes    Cryotherapy Location Forearm;Knee    Type of Cryotherapy Ice pack                    PT Short Term Goals - 06/07/20 0924      PT SHORT TERM GOAL #1   Title thewrapy will review FOTO scores    Time 1    Period Weeks    Status New    Target Date 06/28/20      PT SHORT TERM GOAL #2   Title Patient will be indepdnent with basic HEP for psoterior chain UE strengthening and LE  stability strengthening    Time 3    Period Weeks    Status New    Target Date 06/28/20      PT SHORT TERM GOAL #3   Title Patient will demonstrate improved inferior and superior patella mobility    Time 3    Period Weeks    Status New    Target Date 06/28/20             PT Long Term Goals - 06/07/20 0928      PT  LONG TERM GOAL #1   Title Patient will go down 4 steps without pain    Time 6    Period Weeks    Status New    Target Date 07/19/20      PT LONG TERM GOAL #2   Title Patient will reach to an overhead cabinet with a 2 lb object without pain    Time 6    Period Weeks    Status New    Target Date 07/19/20      PT LONG TERM GOAL #3   Title Patient will reach behind her back without pain    Time 6    Period Weeks    Status New    Target Date 07/19/20                 Plan - 06/12/20 1042    Clinical Impression Statement Patient will be out of town and sked for more visits for her to work on knee and shoulder.  She tolerated exercises well.  WOuld like to be able to rejoin the [facility] and take yoga , began to address shoulder stability on wall in prep for downwrd dog, etc.  Will cont to encourge modification for pain .    PT Treatment/Interventions ADLs/Self Care Home Management;Cryotherapy;Electrical Stimulation;Moist Heat;Iontophoresis 4mg /ml Dexamethasone;Ultrasound;DME Instruction;Gait training;Stair training;Functional mobility training;Therapeutic activities;Therapeutic exercise;Neuromuscular re-education;Patient/family education;Manual techniques;Passive range of motion;Taping    PT Next Visit Plan review FOTO. review HEP; consider psoterio and inferior shoulder glides. Review standing exercises: [facility] for the [facility]. modalities PRN    PT Home Exercise Plan bridge with band; SLR toes up and then out ; clamshell; scap retraction, shoulder extension ER unattached    Consulted and Agree with Plan of Care Patient           Patient will benefit from skilled therapeutic intervention in order to improve the following deficits and impairments:  Abnormal gait, Decreased activity tolerance, Pain, Decreased mobility, Impaired UE functional use  Visit Diagnosis: Chronic pain of right knee  Chronic left shoulder pain  Other abnormalities of gait  and mobility     Problem List Patient Active Problem List   Diagnosis Date  Noted  . Right knee pain 07/01/2017  . Tricompartmental disease of knee 04/13/2017  . Psoriatic arthritis ([place]) 04/17/2016  . LVH (left ventricular hypertrophy) 02/11/2016  . Ascending aortic aneurysm ([place]) 02/11/2016  . Carotid bruit 12/31/2015  . 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 Left wrist pain 07/05/2015  . Bilateral hip pain 07/05/2015  . Leg size inequality 07/05/2015  . Right facial numbness 12/21/2013  . Anxiety and depression 05/13/2012  . Physical exam, routine 05/13/2012  . GERD (gastroesophageal reflux disease) 05/13/2012  . Seasonal allergies   . PCO (polycystic ovaries)   . Ruptured lumbar disc   . Endometriosis   . HYPERTENSION, BENIGN ESSENTIAL 05/01/2010  . NONSPEC ELEVATION OF LEVELS OF TRANSAMINASE/LDH 03/14/2009  . Severe obesity (BMI >= 40) ([place]) 02/11/2009  . RHINITIS 02/11/2009  . ABDOMINAL PAIN, UNSPECIFIED SITE 02/11/2009    Shaneen Reeser 06/12/2020, 12:52 PM  [facility] [address] [place], [place], [address] Phone: [phone]   Fax:  [phone]  Name: Virginia Matthews MRN: [id number] Date of Birth: [birth year]  Raeford Razor, PT 06/12/20 12:53 PM Phone: [phone] Fax: [phone]

## 2020-06-20 DIAGNOSIS — Z20828 Contact with and (suspected) exposure to other viral communicable diseases: Secondary | ICD-10-CM | POA: Diagnosis not present

## 2020-06-28 DIAGNOSIS — Z20822 Contact with and (suspected) exposure to covid-19: Secondary | ICD-10-CM | POA: Diagnosis not present

## 2020-06-28 DIAGNOSIS — Z20828 Contact with and (suspected) exposure to other viral communicable diseases: Secondary | ICD-10-CM | POA: Diagnosis not present

## 2020-07-02 ENCOUNTER — Other Ambulatory Visit: Payer: Self-pay

## 2020-07-02 ENCOUNTER — Ambulatory Visit: Payer: 59 | Attending: Sports Medicine | Admitting: Physical Therapy

## 2020-07-02 DIAGNOSIS — M25561 Pain in right knee: Secondary | ICD-10-CM | POA: Insufficient documentation

## 2020-07-02 DIAGNOSIS — R2689 Other abnormalities of gait and mobility: Secondary | ICD-10-CM | POA: Insufficient documentation

## 2020-07-02 DIAGNOSIS — M25512 Pain in left shoulder: Secondary | ICD-10-CM | POA: Insufficient documentation

## 2020-07-02 DIAGNOSIS — G8929 Other chronic pain: Secondary | ICD-10-CM | POA: Diagnosis not present

## 2020-07-03 ENCOUNTER — Encounter: Payer: Self-pay | Admitting: Physical Therapy

## 2020-07-03 ENCOUNTER — Ambulatory Visit (INDEPENDENT_AMBULATORY_CARE_PROVIDER_SITE_OTHER): Payer: 59 | Admitting: Cardiovascular Disease

## 2020-07-03 ENCOUNTER — Encounter: Payer: Self-pay | Admitting: Cardiovascular Disease

## 2020-07-03 ENCOUNTER — Other Ambulatory Visit: Payer: Self-pay | Admitting: Cardiovascular Disease

## 2020-07-03 VITALS — BP 124/80 | HR 58 | Ht 66.0 in | Wt 293.0 lb

## 2020-07-03 DIAGNOSIS — I517 Cardiomegaly: Secondary | ICD-10-CM

## 2020-07-03 DIAGNOSIS — I359 Nonrheumatic aortic valve disorder, unspecified: Secondary | ICD-10-CM

## 2020-07-03 DIAGNOSIS — I712 Thoracic aortic aneurysm, without rupture: Secondary | ICD-10-CM | POA: Diagnosis not present

## 2020-07-03 DIAGNOSIS — I351 Nonrheumatic aortic (valve) insufficiency: Secondary | ICD-10-CM

## 2020-07-03 DIAGNOSIS — I7121 Aneurysm of the ascending aorta, without rupture: Secondary | ICD-10-CM

## 2020-07-03 DIAGNOSIS — I10 Essential (primary) hypertension: Secondary | ICD-10-CM

## 2020-07-03 HISTORY — DX: Nonrheumatic aortic (valve) insufficiency: I35.1

## 2020-07-03 MED ORDER — LISINOPRIL 20 MG PO TABS: 20.0000 mg | ORAL_TABLET | Freq: Every day | ORAL | 3 refills | Status: DC

## 2020-07-03 NOTE — Patient Instructions (Signed)
Medication Instructions:  Your physician recommends that you continue on your current medications as directed. Please refer to the Current Medication list given to you today.  *If you need a refill on your cardiac medications before your next appointment, please call your pharmacy*   Lab Work: NONE If you have labs (blood work) drawn today and your tests are completely normal, you will receive your results only by:  [place] (if you have MyChart) OR  A paper copy in the mail If you have any lab test that is abnormal or we need to change your treatment, we will call you to review the results.   Testing/Procedures: Your physician has requested that you have an echocardiogram. Echocardiography is a painless test that uses sound waves to create images of your heart. It provides your doctor with information about the size and shape of your heart and how well your hearts chambers and valves are working. This procedure takes approximately one hour. There are no restrictions for this procedure. TO BE DONE IN MAY 2022   Follow-Up: At [facility], you and your health needs are our priority.  As part of our continuing mission to provide you with exceptional heart care, we have created designated Provider Care Teams.  These Care Teams include your primary Cardiologist (physician) and Advanced Practice Providers (APPs -  Physician Assistants and Nurse Practitioners) who all work together to provide you with the care you need, when you need it.  We recommend signing up for the patient portal called "MyChart".  Sign up information is provided on this After Visit Summary.  MyChart is used to connect with patients for Virtual Visits (Telemedicine).  Patients are able to view lab/test results, encounter notes, upcoming appointments, etc.  Non-urgent messages can be sent to your provider as well.   To learn more about what you can do with MyChart, go to [email or web address].    Your next  appointment:   7 month(s) AFTER ECHO  You will receive a reminder letter in the mail two months in advance. If you don't receive a letter, please call our office to schedule the follow-up appointment.   The format for your next appointment:   In Person  Provider:   You may see Skeet Latch, MD or one of the following Advanced Practice Providers on your designated Care Team:    Kerin Ransom, PA-C  [place], [place]  Coletta Memos, [place]

## 2020-07-03 NOTE — Therapy (Signed)
[place], [place], [address] Phone: [phone]   Fax:  [phone]  Physical Therapy Treatment  Patient Details  Name: Virginia Matthews MRN: [id number] Date of Birth: 01/05/1956 Referring Provider (PT): Dr Lilia Argue    Encounter Date: 07/02/2020   PT End of Session - 07/03/20 1259    Visit Number 3    Number of Visits 6    Date for PT Re-Evaluation 07/19/20    Authorization Type [facility] U[facility]    PT Start Time 0345    PT Stop Time 0426    PT Time Calculation (min) 41 min    Activity Tolerance Patient tolerated treatment well    Behavior During Therapy [facility] for tasks assessed/performed           Past Medical History:  Diagnosis Date   Breast mass, left 06/2016   Carpal tunnel syndrome on both sides    GERD (gastroesophageal reflux disease)    Heart murmur    Hypertension    states under control with med., has been on med. x 3-4 yr.   LVH (left ventricular hypertrophy)    moderate, per echo 01/2016   Moderate aortic regurgitation 07/03/2020   Obesity    PONV (postoperative nausea and vomiting)    Psoriatic arthritis ([place])    Rosacea    Thoracic ascending aortic aneurysm [facility])     Past Surgical History:  Procedure Laterality Date   BREAST EXCISIONAL BIOPSY Left    benign   COLONOSCOPY WITH PROPOFOL  09/12/2015   LAPAROSCOPIC ENDOMETRIOSIS FULGURATION     MICRODISCECTOMY LUMBAR Right 07/16/2011   L4-5   PELVIC LAPAROSCOPY     DIAG LAP   RADIOACTIVE SEED GUIDED EXCISIONAL BREAST BIOPSY Left 07/16/2016   Procedure: LEFT RADIOACTIVE SEED GUIDED EXCISIONAL BREAST BIOPSY;  Surgeon: Rolm Bookbinder, MD;  Location: [place];  Service: General;  Laterality: Left;    There were no vitals filed for this visit.   Subjective Assessment - 07/02/20 1553    Subjective Pt reports that bilateral knees have been doing well over the past 2 weeks, and she was able  to walk 5 miles one day during her trip to [place]. Pt states she continues to have L shoulder pain along anterior and superior aspects of acromioclavicular joint that is aggravated due to lifting her husband's scooter frequently.    Currently in Pain? No/denies   L shoulder pain with activity but none reported at moment   Pain Score 0    Pain Location Shoulder    Pain Orientation Left    Pain Descriptors / Indicators Sharp;Aching    Pain Type Chronic pain    Pain Onset More than a month ago    Pain Frequency Intermittent    Aggravating Factors  with activity in certain ranges and lifting husband's scooter    Pain Relieving Factors minimal to no pain and rest and when avoiding painful ranges/planes of motion    Effect of Pain on Daily Activities difficulty lifting husband's scooter and reaching in certain planes                             [facility] Adult PT Treatment/Exercise - 07/03/20 0001      Knee/Hip Exercises: Standing   Hip Flexion Limitations slow standing marches alternating BLE with BUE on counter    Hip Abduction Stengthening;Both;1 set;10 reps    Abduction Limitations BUE support at  counter    Hip Extension Stengthening;Both;1 set;10 reps    Extension Limitations BUE support at counter      Shoulder Exercises: Standing   External Rotation Strengthening;Left;15 reps    Theraband Level (Shoulder External Rotation) Level 2 (Red)    Internal Rotation Strengthening;Both;15 reps;Theraband    Theraband Level (Shoulder Internal Rotation) Level 2 (Red)    Extension Strengthening;Both;15 reps;Theraband    Theraband Level (Shoulder Extension) Level 3 (Green)    Row Strengthening;Both;15 reps    Theraband Level (Shoulder Row) Level 3 (Green)      Manual Therapy   Manual therapy comments R medial/lateral patellar mobilizations; L shoulder posterior and inferior glides grades III and IV                  PT Education - 07/03/20 1258    Education Details  reviewed ther-ex for shoulders and reasoining    Person(s) Educated Patient    Methods Explanation;Demonstration    Comprehension Returned demonstration;Verbal cues required;Tactile cues required;Verbalized understanding            PT Short Term Goals - 06/07/20 0924      PT SHORT TERM GOAL #1   Title thewrapy will review FOTO scores    Time 1    Period Weeks    Status New    Target Date 06/28/20      PT SHORT TERM GOAL #2   Title Patient will be indepdnent with basic HEP for psoterior chain UE strengthening and LE  stability strengthening    Time 3    Period Weeks    Status New    Target Date 06/28/20      PT SHORT TERM GOAL #3   Title Patient will demonstrate improved inferior and superior patella mobility    Time 3    Period Weeks    Status New    Target Date 06/28/20             PT Long Term Goals - 06/07/20 0928      PT LONG TERM GOAL #1   Title Patient will go down 4 steps without pain    Time 6    Period Weeks    Status New    Target Date 07/19/20      PT LONG TERM GOAL #2   Title Patient will reach to an overhead cabinet with a 2 lb object without pain    Time 6    Period Weeks    Status New    Target Date 07/19/20      PT LONG TERM GOAL #3   Title Patient will reach behind her back without pain    Time 6    Period Weeks    Status New    Target Date 07/19/20                 Plan - 07/03/20 1301    Clinical Impression Statement Patient had full ROM after manual therapy to her left shoulder today. her pain is only in certain positions. She wa advised to see how she feels over the next few days. Therapy continues to progress her scpaular and RTC strengthening. her knee is making good progress. she has good patellar motion de to sside today but still limited up and down. Therapy will continue to progress as tolerated.    Personal Factors and Comorbidities Comorbidity 1;Comorbidity 2    Comorbidities obesity, psoriatic arthrisi, OA     Examination-Activity Limitations Squat;Stairs;Stand;Locomotion Level  Examination-Participation Restrictions Community Activity;Shop;Cleaning    Stability/Clinical Decision Making Stable/Uncomplicated    Clinical Decision Making Low    Rehab Potential Good    PT Duration 6 weeks    PT Treatment/Interventions ADLs/Self Care Home Management;Cryotherapy;Electrical Stimulation;Moist Heat;Iontophoresis 4mg /ml Dexamethasone;Ultrasound;DME Instruction;Gait training;Stair training;Functional mobility training;Therapeutic activities;Therapeutic exercise;Neuromuscular re-education;Patient/family education;Manual techniques;Passive range of motion;Taping           Patient will benefit from skilled therapeutic intervention in order to improve the following deficits and impairments:  Abnormal gait, Decreased activity tolerance, Pain, Decreased mobility, Impaired UE functional use  Visit Diagnosis: Chronic pain of right knee  Chronic left shoulder pain  Other abnormalities of gait and mobility     Problem List 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   Psoriatic arthritis ([place]) 04/17/2016   LVH (left ventricular hypertrophy) 02/11/2016   Ascending aortic aneurysm ([place]) 02/11/2016   Carotid bruit 12/31/2015   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 41/96/2229   Left wrist pain 07/05/2015   Bilateral hip pain 07/05/2015   Leg size inequality 07/05/2015   Right facial numbness 12/21/2013   Anxiety and depression 05/13/2012   Physical exam, routine 05/13/2012   GERD (gastroesophageal reflux disease) 05/13/2012   Seasonal allergies    PCO (polycystic ovaries)    Ruptured lumbar disc    Endometriosis    HYPERTENSION, BENIGN ESSENTIAL 05/01/2010   NONSPEC ELEVATION OF LEVELS OF TRANSAMINASE/LDH 03/14/2009   Severe  obesity (BMI >= 40) ([place]) 02/11/2009   RHINITIS 02/11/2009   ABDOMINAL PAIN, UNSPECIFIED SITE 02/11/2009    Carney Living PT DPT  07/03/2020, 1:05 PM  [place] [facility] [address] [place], [place], [address] Phone: [phone]   Fax:  [phone]  Name: BLASA RAISCH MRN: [id number] Date of Birth: 1955-12-27

## 2020-07-03 NOTE — Progress Notes (Signed)
Cardiology Office Note   Date:  07/03/2020   ID:  Virginia Matthews, DOB 17-Jun-1956, MRN [id number]  PCP:  Midge Minium, MD  Cardiologist:   Skeet Latch, MD   No chief complaint on file.   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 [name] initially presented 12/2015 with atypical chest pain that occurred in stressful situations. She also has a family history of premature CAD. Therefore she was referred for cardiac CT angiography and coronary calcium scoring. Her coronary calcium score was 0 and there were no obstructive lesions. It showed a dilated pulmonary artery suggestive of pulmonary hypertension and her ascending thoracic aortic was 4.2 cm. She was referred for echocardiography that revealed PASP 37 mmHg and was otherwise unremarkable. She had a repeat echo 01/2020 that revealed LVEF 60 to 65% with normal diastolic function.  Her aorta was 4.4 cm but she had moderate aortic regurgitation.  She followed up with Almyra Deforest, PA, on 07/2016 for palpitations. She wore a 24-hour Holter 08/2016 that revealed frequent PVCs and occasional PACs. She was started on metoprolol.  Metoprolol was stopped because her palpitations were better controlled.  She was started on lisinopril due to poorly controlled blood pressure.    She continues to take care of her husband who had a stroke.  They were able to do some traveling lately but she notes that it has been more difficult since he has slowed down.  She has been struggling with her arthritis and has been going to physical therapy for about 3 weeks.  It does seem as though this is helping.  She also walks for about 20 to 25 minutes 5 days/week.  She notes that she gets somewhat short of breath with exertion but nothing out of the ordinary.  She denies any exertional chest pain or  pressure.  She has not had any lower extremity edema, orthopnea, or PND.  Past Medical History:  Diagnosis Date  . Breast mass, left 06/2016  . Carpal tunnel syndrome on both sides   . GERD (gastroesophageal reflux disease)   . Heart murmur   . Hypertension    states under control with med., has been on med. x 3-4 yr.  . LVH (left ventricular hypertrophy)    moderate, per echo 01/2016  . Moderate aortic regurgitation 07/03/2020  . Obesity   . PONV (postoperative nausea and vomiting)   . Psoriatic arthritis ([place])   . Rosacea   . Thoracic ascending aortic aneurysm [facility])     Past Surgical History:  Procedure Laterality Date  . BREAST EXCISIONAL BIOPSY Left    benign  . COLONOSCOPY WITH PROPOFOL  09/12/2015  . LAPAROSCOPIC ENDOMETRIOSIS FULGURATION    . MICRODISCECTOMY LUMBAR Right 07/16/2011   L4-5  . PELVIC LAPAROSCOPY     DIAG LAP  . RADIOACTIVE SEED GUIDED EXCISIONAL BREAST BIOPSY Left 07/16/2016   Procedure: LEFT RADIOACTIVE SEED GUIDED EXCISIONAL BREAST BIOPSY;  Surgeon: Rolm Bookbinder, MD;  Location: [place];  Service: General;  Laterality: Left;     Current Outpatient Medications  Medication Sig Dispense Refill  . Adalimumab (HUMIRA PEN) 40 MG/0.4ML PNKT Inject 1 pen into the skin once a week. 12 each 1  . b complex vitamins tablet Take 1 tablet by mouth daily.    . calcipotriene-betamethasone (TACLONEX) ointment Apply topically daily.    Marland Kitchen escitalopram (LEXAPRO) 10 MG  tablet TAKE 1 TABLET BY MOUTH ONCE DAILY. 90 tablet 1  . fluticasone (FLONASE) 50 MCG/ACT nasal spray Place 2 sprays into both nostrils daily. 16 g 3  . hydrocortisone valerate cream (WESTCORT) 0.2 %   3  . lisinopril (ZESTRIL) 20 MG tablet Take 1 tablet (20 mg total) by mouth daily. 90 tablet 3  . metroNIDAZOLE (METROGEL) 0.75 % gel   11  . Multiple Vitamins-Minerals (CENTRUM SILVER PO) Take 1 tablet by mouth daily. Use as directed     . naproxen (NAPROSYN) 250 MG tablet Take by  mouth daily.    . Omega-3 Fatty Acids (FISH OIL PO) Take 1 capsule by mouth daily.    . pantoprazole (PROTONIX) 40 MG tablet TAKE 1 TABLET BY MOUTH DAILY. 90 tablet 1  . TURMERIC PO Take 1 capsule by mouth 2 (two) times daily.    Marland Kitchen VITAMIN D, CHOLECALCIFEROL, PO Take 2,000 Units by mouth daily.      No current facility-administered medications for this visit.    Allergies:   Adhesive [tape]    Social History:  The patient  reports that she has never smoked. She has never used smokeless tobacco. She reports current alcohol use. She reports that she does not use drugs.   Family History:  The patient's family history includes Asthma in her brother; Breast cancer in her mother; CAD in her brother and father; Cancer in her brother and father; Heart disease in her father; Hypertension in her brother and father.    ROS:  Please see the history of present illness.   Otherwise, review of systems are positive for none.   All other systems are reviewed and negative.    PHYSICAL EXAM: VS:  BP 124/80   Pulse (!) 58   Ht 5\' 6"  (1.676 m)   Wt 293 lb (132.9 kg)   SpO2 95%   BMI 47.29 kg/m  , BMI Body mass index is 47.29 kg/m. GENERAL:  Well appearing HEENT:  Pupils equal round and reactive, fundi not visualized, oral mucosa unremarkable NECK:  No jugular venous distention, waveform within normal limits, carotid upstroke brisk and symmetric, no bruits LUNGS:  Clear to auscultation bilaterally HEART:  RRR.  PMI not displaced or sustained,S1 and S2 within normal limits, no S3, no S4, no clicks, no rubs, no murmurs ABD:  Flat, positive bowel sounds normal in frequency in pitch, no bruits, no rebound, no guarding, no midline pulsatile mass, no hepatomegaly, no splenomegaly EXT:  2 plus pulses throughout, no edema, no cyanosis no clubbing SKIN:  No rashes no nodules NEURO:  Cranial nerves II through XII grossly intact, motor grossly intact throughout PSYCH:  Cognitively intact, oriented to person  place and time    EKG:  EKG is ordered today. The ekg ordered today demonstrates sinus bradycardia.  Rate 58 bpm.  Cardiac CT-A 01/16/16: Aortic Valve: Trileaflet, normal thickness, no calcifications. Coronary Arteries: Normal coronary origin. Right dominance. Left main is a large artery with no plaque. LAD is a large caliber artery that gives rise to 3 small diagonal branches and wraps around the apex. There is no plaque. 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 LCX is a medium caliber vessel that gives rise to two small OM branches, there is no plaque. RCA is a large caliber that gives rise to PDA and PLA. There is no plaque. Other findings: Dilated pulmonary artery measuring 36 x 31 mm suggestive of pulmonary hypertension. Normal pulmonary vein drainage into the  left atrium. A large left atrial appendage without evidence of a thrombus. IMPRESSION: 1. Coronary calcium score of 0. This was 0 percentile for age and sex matched control. 2. Normal coronary origin with right dominance. 3. No evidence of CAD. 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 4. Dilated pulmonary artery suggestive of pulmonary hypertension.  IMPRESSION: 1. Evidence of probable air trapping in the visualize lung bases, suggesting small airways disease. 2. Ectasia of the ascending thoracic aorta (4.2 cm in diameter).   Echo 02/06/16:  Study Conclusions  - Left ventricle: The cavity size was normal. Wall thickness was  increased in a pattern of moderate LVH. Systolic function was  normal. The estimated ejection fraction was in the range of 60%  to 65%. Wall motion was normal; there were no regional wall  motion abnormalities. Left ventricular diastolic function  parameters were normal. - Aortic valve: Trileaflet. There was no stenosis. There was  trivial regurgitation. - Ascending aorta: The ascending aorta was dilated to 4 cm. - Mitral valve:  Mildly thickened leaflets . There was trivial  regurgitation. - Left atrium: The atrium was normal in size. - Right ventricle: The cavity size was mildly dilated. Systolic  function is reduced. - Right atrium: The atrium was mildly dilated. - Tricuspid valve: There was trivial regurgitation. - Pulmonary arteries: The main pulmonary artery was not  well-visualized. PA peak pressure: 37 mm Hg (S). - Systemic veins: The IVC measures <2.1 cm, but does not collapse  >50%, suggesting an elevated RA pressure of 8 mmHg.  Impressions:  - LVEF 60-65%, moderate LVH, normal wall motion, normal diastolic  function, normal LA size, mildly dilated ascending aorta to 4.0  cm. Mild RAE and RVE with reduced RV systolic function, trivial  TR, RVSP 37 mmHg, mildly elevated RA pressure of 8 mmHg. Echo 01/2017: Study Conclusions  - Left ventricle: The cavity size was normal. Wall thickness was increased in a pattern of mild LVH. Systolic function was normal. The estimated ejection fraction was in the range of 55% to 60%. Wall motion was normal; there were no regional wall motion abnormalities. Left ventricular diastolic function parameters were normal. - Aortic valve: There was mild regurgitation. - Left atrium: The atrium was mildly dilated. - Atrial septum: No defect or patent foramen ovale was identified.  24 Hour Holter Monitor 09/07/16:  Quality: Fair. Baseline artifact. Predominant rhythm: sinus rhythm Average heart rate: 66 bpm Max heart rate: 119 bpm Min heart rate: 45 bpm  Frequent PVCs (1.4%) Ventricular trigeminy noted Occasional PACs   Recent Labs: 05/20/2020: ALT 24; BUN 16; Creatinine, Ser 0.75; Hemoglobin 13.2; Platelets 151.0; Potassium 4.2; Sodium 140; TSH 2.24    Lipid Panel    Component Value Date/Time   CHOL 165 05/20/2020 0906   TRIG 97.0 05/20/2020 0906   HDL 43.50 05/20/2020 0906   CHOLHDL 4 05/20/2020 0906   VLDL 19.4 05/20/2020 0906    LDLCALC 102 (H) 05/20/2020 0906      Wt Readings from Last 3 Encounters:  07/03/20 293 lb (132.9 kg)  06/06/20 290 lb (131.5 kg)  05/28/20 292 lb (132.5 kg)      ASSESSMENT AND PLAN:  # Atypical chest pain: Resolved. 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 Her coronary calcium score was 0. Her ASCVD 10 year risk is 3.4%. Aspirin is not indicated.   # Hypertension:BPis  well-controlled on lisinopril.  She does note a mild cough that she is unsure whether it is due  to allergies or the lisinopril.  She would rather continue on lisinopril at this time.  If she would like we would be happy to switch her to an ARB in the future.  #Morbid obesity: [name] wascongratulated on her walking efforts.  Keep up the good work.  # Carotid bruit: No carotid stenosis on carotid Doppler.   # Ascending thoracic aortic aneurysm: # Moderate aortic regurgitation. 4.5 cm 01/2019 and 4.4 cm on 01/2020.  She does now have moderate aortic regurgitation.  She is asymptomatic and has no evidence of systolic dysfunction or left ventricular dilatation.  We will repeat her echo 01/2021.   Current medicines are reviewed at length with the patient today.  The patient does not have concerns regarding medicines.  The following changes have been made:  no change  Labs/ tests ordered today include:   Orders Placed This Encounter  Procedures  . EKG 12-Lead     Disposition:   FU with Melony Tenpas C. Oval Linsey, MD, [facility] 01/2021 after echo     Signed, Rance Smithson C. Oval Linsey, MD, [facility]  07/03/2020 10:11 AM    [facility]

## 2020-07-05 ENCOUNTER — Ambulatory Visit (HOSPITAL_BASED_OUTPATIENT_CLINIC_OR_DEPARTMENT_OTHER): Payer: 59 | Admitting: Pharmacist

## 2020-07-05 ENCOUNTER — Other Ambulatory Visit: Payer: Self-pay

## 2020-07-05 ENCOUNTER — Other Ambulatory Visit: Payer: Self-pay | Admitting: Pharmacist

## 2020-07-05 DIAGNOSIS — Z79899 Other long term (current) drug therapy: Secondary | ICD-10-CM

## 2020-07-05 MED ORDER — STELARA 90 MG/ML ~~LOC~~ SOSY: PREFILLED_SYRINGE | SUBCUTANEOUS | 4 refills | Status: DC

## 2020-07-05 NOTE — Progress Notes (Signed)
  S: Patient presents for review of their specialty medication therapy.  Patient is currently taking Stelara for psoriatic arthritis. Patient is managed by Dr. Trudie Reed for this.   Adherence: she's changing from Humira  Efficacy: medication therapy is being initiated   Dosing:  Crohn disease:  Psoriatic arthritis: SubQ:   >100 kg: Initial and maintenance: 90 mg at 0 and 4 weeks, and then every 12 weeks thereafter.  Dose adjustments: Renal: no dose adjustments (has not been studied) Hepatic: no dose adjustments (has not been studied) Special populations: . Patients >100 kg: May require higher dose to achieve adequate serum levels.  Drug-drug interactions: none identified   Screening: TB test: completed  Hepatitis: completed   Monitoring: S/sx of infection: none  CBC: monitored by Rheum Reversible posterior leukoencephalopathy syndrome (RPLS - sx include headache, seizures, confusion, and visual disturbances): none  Squamous cell skin carcinoma: none   Dosage form specific issues: . Latex: Packaging may contain natural latex rubber. . Polysorbate 80: Some dosage forms may contain polysorbate 80 (also known as Tweens). Hypersensitivity reactions, usually a delayed reaction, have been reported following exposure to pharmaceutical products containing polysorbate 80 in certain individuals Dolly Rias, 2002; Lucente 2000; Lollie Marrow, [place]). Thrombocytopenia, ascites, pulmonary deterioration, and renal and hepatic failure have been reported in premature neonates after receiving parenteral products containing polysorbate 80 ([place], 1986; CDC, 1984). See manufacturer's labeling.   O:     Lab Results  Component Value Date   WBC 7.4 05/20/2020   HGB 13.2 05/20/2020   HCT 39.6 05/20/2020   MCV 92.2 05/20/2020   PLT 151.0 05/20/2020      Chemistry      Component Value Date/Time   NA 140 05/20/2020 0906   K 4.2 05/20/2020 0906   CL 104 05/20/2020 0906   CO2 28 05/20/2020 0906   BUN  16 05/20/2020 0906   CREATININE 0.75 05/20/2020 0906   CREATININE 0.77 12/31/2015 0901      Component Value Date/Time   CALCIUM 9.2 05/20/2020 0906   ALKPHOS 64 05/20/2020 0906   AST 24 05/20/2020 0906   ALT 24 05/20/2020 0906   BILITOT 0.4 05/20/2020 0906       A/P: 1. Medication review: patient currently on Stelara for psoriatic arthritis. Reviewed the medication with the patient, including the following: Stelara, ustekinumab, is a TNF? blocker. Patient educated on purpose, proper use and potential adverse effects of Stelara.  Following instruction patient verbalized understanding of treatment plan. There is an increased risk of infection and malignancy with this medication. Do not give patients live vaccinations while they are on this medication. Subcutaneous: Administer by subcutaneous injection into the top of the thigh, abdomen, upper arms, or buttocks. Rotate sites. Do not inject into tender, bruised, erythematous, or indurated skin. Avoid areas of skin where psoriasis is present. Discard any unused portion. Intended for use under supervision of physician; self-injection may occur after proper training. If using the single-dose vial, a 1 mL syringe with a 27-gauge 1/2 inch needle is recommended. No recommendations for any changes.Benard Halsted, PharmD, [place] [phone]

## 2020-07-09 ENCOUNTER — Encounter: Payer: Self-pay | Admitting: Physical Therapy

## 2020-07-09 ENCOUNTER — Ambulatory Visit: Payer: 59 | Admitting: Physical Therapy

## 2020-07-09 ENCOUNTER — Encounter: Payer: 59 | Admitting: Physical Therapy

## 2020-07-09 ENCOUNTER — Other Ambulatory Visit: Payer: Self-pay

## 2020-07-09 DIAGNOSIS — R2689 Other abnormalities of gait and mobility: Secondary | ICD-10-CM | POA: Diagnosis not present

## 2020-07-09 DIAGNOSIS — G8929 Other chronic pain: Secondary | ICD-10-CM

## 2020-07-09 DIAGNOSIS — M25512 Pain in left shoulder: Secondary | ICD-10-CM | POA: Diagnosis not present

## 2020-07-09 DIAGNOSIS — M25561 Pain in right knee: Secondary | ICD-10-CM | POA: Diagnosis not present

## 2020-07-10 ENCOUNTER — Encounter: Payer: Self-pay | Admitting: Physical Therapy

## 2020-07-10 NOTE — Therapy (Signed)
[place], [place], [address] Phone: [phone]   Fax:  [phone]  Physical Therapy Treatment  Patient Details  Name: Virginia Matthews MRN: [id number] Date of Birth: 1956/08/01 Referring Provider (PT): Dr Lilia Argue    Encounter Date: 07/09/2020   PT End of Session - 07/09/20 1644    Visit Number 4    Number of Visits 6    Date for PT Re-Evaluation 07/19/20    Authorization Type [facility] UMR    PT Start Time 1497    PT Stop Time 0263    PT Time Calculation (min) 38 min    Activity Tolerance Patient tolerated treatment well    Behavior During Therapy [facility] for tasks assessed/performed           Past Medical History:  Diagnosis Date  . Breast mass, left 06/2016  . Carpal tunnel syndrome on both sides   . GERD (gastroesophageal reflux disease)   . Heart murmur   . Hypertension    states under control with med., has been on med. x 3-4 yr.  . LVH (left ventricular hypertrophy)    moderate, per echo 01/2016  . Moderate aortic regurgitation 07/03/2020  . Obesity   . PONV (postoperative nausea and vomiting)   . Psoriatic arthritis ([place])   . Rosacea   . Thoracic ascending aortic aneurysm [facility]     Past Surgical History:  Procedure Laterality Date  . BREAST EXCISIONAL BIOPSY Left    benign  . COLONOSCOPY WITH PROPOFOL  09/12/2015  . LAPAROSCOPIC ENDOMETRIOSIS FULGURATION    . MICRODISCECTOMY LUMBAR Right 07/16/2011   L4-5  . PELVIC LAPAROSCOPY     DIAG LAP  . RADIOACTIVE SEED GUIDED EXCISIONAL BREAST BIOPSY Left 07/16/2016   Procedure: LEFT RADIOACTIVE SEED GUIDED EXCISIONAL BREAST BIOPSY;  Surgeon: Rolm Bookbinder, MD;  Location: [place];  Service: General;  Laterality: Left;    There were no vitals filed for this visit.   Subjective Assessment - 07/09/20 1642    Subjective Patient continues to report the knees and shoulder shave been doing well. She is not sure if  the shoulder is better or she is just gaurding.    Pertinent History bilateral carpal tunnel, left ventricular hypertrophy, OA Psoriatic arthritis    How long can you sit comfortably? No limit    How long can you stand comfortably? depdns on how flaired up the knee is    How long can you walk comfortably? walks for exercise 5 days a week    Diagnostic tests X-ray: 2:15 shows significant joint space narrowing    Patient Stated Goals to have less pain when she ambualtes.    Currently in Pain? No/denies                             [facility] Adult PT Treatment/Exercise - 07/10/20 0001      Knee/Hip Exercises: Standing   Other Standing Knee Exercises h       Knee/Hip Exercises: Supine   Bridges Limitations x 10 with green band     Straight Leg Raises Limitations 2x10 1lb and 2b 2lb too much     Other Supine Knee/Hip Exercises supine clamshell 2x10 green       Shoulder Exercises: Supine   Other Supine Exercises wand flexion 2x10; supine ABC 2x10 2lb weight       Shoulder Exercises: Standing   Extension Strengthening;Both;15 reps;Theraband  Theraband Level (Shoulder Extension) Level 3 (Green)    Row Strengthening;Both;15 reps    Theraband Level (Shoulder Row) Level 3 (Green)    Other Standing Exercises towel vs wall cw/ccw/  flexion/ side to side 10 each       Manual Therapy   Manual Therapy Soft tissue mobilization    Manual therapy comments R medial/lateral patellar mobilizations; L shoulder posterior and inferior glides grades III and IV    Soft tissue mobilization to upper trap and anterior shoulder                   PT Education - 07/09/20 1643    Education Details HEP and symptom mangement    Person(s) Educated Patient    Methods Explanation;Demonstration;Tactile cues;Verbal cues    Comprehension Verbalized understanding;Returned demonstration;Tactile cues required;Verbal cues required            PT Short Term Goals - 06/07/20 0924      PT  SHORT TERM GOAL #1   Title thewrapy will review FOTO scores    Time 1    Period Weeks    Status New    Target Date 06/28/20      PT SHORT TERM GOAL #2   Title Patient will be indepdnent with basic HEP for psoterior chain UE strengthening and LE  stability strengthening    Time 3    Period Weeks    Status New    Target Date 06/28/20      PT SHORT TERM GOAL #3   Title Patient will demonstrate improved inferior and superior patella mobility    Time 3    Period Weeks    Status New    Target Date 06/28/20             PT Long Term Goals - 06/07/20 0928      PT LONG TERM GOAL #1   Title Patient will go down 4 steps without pain    Time 6    Period Weeks    Status New    Target Date 07/19/20      PT LONG TERM GOAL #2   Title Patient will reach to an overhead cabinet with a 2 lb object without pain    Time 6    Period Weeks    Status New    Target Date 07/19/20      PT LONG TERM GOAL #3   Title Patient will reach behind her back without pain    Time 6    Period Weeks    Status New    Target Date 07/19/20                 Plan - 07/10/20 1400    Clinical Impression Statement Patient is making good progress. she did not have pain with treatment tpday. Therapy had her perfrom endurance exercises for her shoulder in flexion. She reported no signifciant pain. She had no pain. Therapy advanced her LE exercises. She will continue her exercises at home. she was also given shoulder ABc's    Personal Factors and Comorbidities Comorbidity 1;Comorbidity 2    Comorbidities obesity, psoriatic arthrisi, OA    Examination-Activity Limitations Squat;Stairs;Stand;Locomotion Level    Examination-Participation Restrictions Community Activity;Shop;Cleaning    Stability/Clinical Decision Making Stable/Uncomplicated    Clinical Decision Making Low    Rehab Potential Good    PT Frequency 1x / week    PT Duration 6 weeks    PT Treatment/Interventions ADLs/Self Care Home  Management;Cryotherapy;Dealer  Stimulation;Moist Heat;Iontophoresis 4mg /ml Dexamethasone;Ultrasound;DME Instruction;Gait training;Stair training;Functional mobility training;Therapeutic activities;Therapeutic exercise;Neuromuscular re-education;Patient/family education;Manual techniques;Passive range of motion;Taping    PT Next Visit Plan review FOTO. review HEP; consider psoterio and inferior shoulder glides. Review standing exercises: [facility] for [facility]. modalities PRN    PT Home Exercise Plan bridge with band; SLR toes up and then out ; clamshell; scap retraction, shoulder extension ER unattached    Consulted and Agree with Plan of Care Patient           Patient will benefit from skilled therapeutic intervention in order to improve the following deficits and impairments:  Abnormal gait, Decreased activity tolerance, Pain, Decreased mobility, Impaired UE functional use  Visit Diagnosis: Chronic pain of right knee  Chronic left shoulder pain  Other abnormalities of gait and mobility     Problem List Patient Active Problem List   Diagnosis Date Noted  . Moderate aortic regurgitation 07/03/2020  . Right knee pain 07/01/2017  . Tricompartmental disease of knee 04/13/2017  . Psoriatic arthritis ([place]) 04/17/2016  . LVH (left ventricular hypertrophy) 02/11/2016  . Ascending aortic aneurysm ([place]) 02/11/2016  . Carotid bruit 12/31/2015  . 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 Left wrist pain 07/05/2015  . Bilateral hip pain 07/05/2015  . Leg size inequality 07/05/2015  . Right facial numbness 12/21/2013  . Anxiety and depression 05/13/2012  . Physical exam, routine 05/13/2012  . GERD (gastroesophageal reflux disease) 05/13/2012  . Seasonal allergies   . PCO (polycystic ovaries)   . Ruptured lumbar disc   . Endometriosis   . HYPERTENSION, BENIGN ESSENTIAL 05/01/2010  . NONSPEC ELEVATION OF  LEVELS OF TRANSAMINASE/LDH 03/14/2009  . Severe obesity (BMI >= 40) ([place]) 02/11/2009  . RHINITIS 02/11/2009  . ABDOMINAL PAIN, UNSPECIFIED SITE 02/11/2009    Carney Living PT DPT  07/10/2020, 2:09 PM  [facility] [address] [place], [place], [address] Phone: [phone]   Fax:  [phone]  Name: Virginia Matthews MRN: [id number] Date of Birth: 1956-03-08

## 2020-07-16 ENCOUNTER — Other Ambulatory Visit: Payer: Self-pay

## 2020-07-16 ENCOUNTER — Encounter: Payer: Self-pay | Admitting: Physical Therapy

## 2020-07-16 ENCOUNTER — Ambulatory Visit: Payer: 59 | Admitting: Physical Therapy

## 2020-07-16 DIAGNOSIS — G8929 Other chronic pain: Secondary | ICD-10-CM

## 2020-07-16 DIAGNOSIS — R2689 Other abnormalities of gait and mobility: Secondary | ICD-10-CM

## 2020-07-16 DIAGNOSIS — M25561 Pain in right knee: Secondary | ICD-10-CM | POA: Diagnosis not present

## 2020-07-16 DIAGNOSIS — M25512 Pain in left shoulder: Secondary | ICD-10-CM | POA: Diagnosis not present

## 2020-07-16 NOTE — Therapy (Signed)
[place], [place], [address] Phone: [phone]   Fax:  [phone]  Physical Therapy Treatment/Discharge   Patient Details  Name: Virginia Matthews MRN: [id number] Date of Birth: [birth year] Referring Provider (PT): Dr Lilia Argue    Encounter Date: 07/16/2020   PT End of Session - 07/16/20 1101    Visit Number 5    Number of Visits 6    Date for PT Re-Evaluation 07/19/20    Authorization Type [facility] UMR    PT Start Time 0930    PT Stop Time 1012    PT Time Calculation (min) 42 min    Activity Tolerance Patient tolerated treatment well    Behavior During Therapy [facility] for tasks assessed/performed           Past Medical History:  Diagnosis Date  . Breast mass, left 06/2016  . Carpal tunnel syndrome on both sides   . GERD (gastroesophageal reflux disease)   . Heart murmur   . Hypertension    states under control with med., has been on med. x 3-4 yr.  . LVH (left ventricular hypertrophy)    moderate, per echo 01/2016  . Moderate aortic regurgitation 07/03/2020  . Obesity   . PONV (postoperative nausea and vomiting)   . Psoriatic arthritis ([place])   . Rosacea   . Thoracic ascending aortic aneurysm [facility])     Past Surgical History:  Procedure Laterality Date  . BREAST EXCISIONAL BIOPSY Left    benign  . COLONOSCOPY WITH PROPOFOL  09/12/2015  . LAPAROSCOPIC ENDOMETRIOSIS FULGURATION    . MICRODISCECTOMY LUMBAR Right 07/16/2011   L4-5  . PELVIC LAPAROSCOPY     DIAG LAP  . RADIOACTIVE SEED GUIDED EXCISIONAL BREAST BIOPSY Left 07/16/2016   Procedure: LEFT RADIOACTIVE SEED GUIDED EXCISIONAL BREAST BIOPSY;  Surgeon: Rolm Bookbinder, MD;  Location: [place];  Service: General;  Laterality: Left;    There were no vitals filed for this visit.   Subjective Assessment - 07/16/20 1515    Subjective Patient reports that one day last week she had some shoulder pain but it  isimporved. She feels like her knee pain increases when she increases her acitvity but it is controlled. She feels like she has a good program and is comfortable with discharge.    Pertinent History bilateral carpal tunnel, left ventricular hypertrophy, OA Psoriatic arthritis    How long can you sit comfortably? No limit    How long can you stand comfortably? depdns on how flaired up the knee is    How long can you walk comfortably? walks for exercise 5 days a week    Diagnostic tests X-ray: 2:15 shows significant joint space narrowing    Patient Stated Goals to have less pain when she ambualtes.    Currently in Pain? No/denies    Pain Score 3     Pain Location Knee    Pain Orientation Right    Pain Descriptors / Indicators Aching    Pain Type Chronic pain    Pain Onset 1 to 4 weeks ago    Pain Frequency Intermittent    Aggravating Factors  standing and walking    Pain Relieving Factors reviewed HEP and symptom management    Effect of Pain on Daily Activities pain walking down the steps              [facility] PT Assessment - 07/16/20 0001      PROM   Overall PROM  Comments full passvie knee ROM       Strength   Overall Strength Comments knee and hip strength 5/5 gross     Right Shoulder Flexion 5/5    Left Shoulder Flexion 4+/5    Left Shoulder Internal Rotation 5/5    Left Shoulder External Rotation 5/5      Palpation   Palpation comment no unexpected tenderness to palpation in the shoulder or knee                                  PT Education - 07/16/20 1517    Education Details reviewed final HEP and symptom mangement    Person(s) Educated Patient    Methods Explanation;Demonstration;Tactile cues;Verbal cues    Comprehension Verbalized understanding;Returned demonstration;Verbal cues required;Tactile cues required            PT Short Term Goals - 07/16/20 1524      PT SHORT TERM GOAL #1   Title therapy will review FOTO scores    Time 1     Period Weeks    Status Achieved    Target Date 06/28/20      PT SHORT TERM GOAL #2   Title Patient will be indepdnent with basic HEP for psoterior chain UE strengthening and LE  stability strengthening    Baseline perfroming all ther-ex    Period Weeks    Status Achieved    Target Date 06/28/20      PT SHORT TERM GOAL #3   Title Patient will demonstrate improved inferior and superior patella mobility    Baseline perfroming daily with improved movement    Time 3    Period Weeks    Status Achieved    Target Date 06/28/20             PT Long Term Goals - 06/07/20 0928      PT LONG TERM GOAL #1   Title Patient will go down 4 steps without pain    Time 6    Period Weeks    Status New    Target Date 07/19/20      PT LONG TERM GOAL #2   Title Patient will reach to an overhead cabinet with a 2 lb object without pain    Time 6    Period Weeks    Status New    Target Date 07/19/20      PT LONG TERM GOAL #3   Title Patient will reach behind her back without pain    Time 6    Period Weeks    Status New    Target Date 07/19/20                 Plan - 07/16/20 1518    Clinical Impression Statement Patient has made good progress. Her shoulder motion and strength have improved. She continues to have knee pain with activity but it has improved. Her shoulder FOTO score is trending well. Her knee FOTO score has remained constant. Therapy reviewed basic stretching with the shoulders and base exercises with the knees. We also reviewed how to progress knee and shoulder strength. She feels comfortable with exercises.    Personal Factors and Comorbidities Comorbidity 1;Comorbidity 2    Comorbidities obesity, psoriatic arthrisi, OA    Examination-Activity Limitations Squat;Stairs;Stand;Locomotion Level    Examination-Participation Restrictions Community Activity;Shop;Cleaning    Stability/Clinical Decision Making Stable/Uncomplicated    Clinical Decision Making Low  Rehab  Potential Good    PT Frequency 1x / week    PT Duration 6 weeks    PT Treatment/Interventions ADLs/Self Care Home Management;Cryotherapy;Electrical Stimulation;Moist Heat;Iontophoresis 68m/ml Dexamethasone;Ultrasound;DME Instruction;Gait training;Stair training;Functional mobility training;Therapeutic activities;Therapeutic exercise;Neuromuscular re-education;Patient/family education;Manual techniques;Passive range of motion;Taping    PT Next Visit Plan review FOTO. review HEP; consider psoterio and inferior shoulder glides. Review standing exercises: [facility] review thenm for the pool. modalities PRN    PT Home Exercise Plan bridge with band; SLR toes up and then out ; clamshell; scap retraction, shoulder extension ER unattached    Consulted and Agree with Plan of Care Patient           Patient will benefit from skilled therapeutic intervention in order to improve the following deficits and impairments:  Abnormal gait, Decreased activity tolerance, Pain, Decreased mobility, Impaired UE functional use  Visit Diagnosis: Chronic pain of right knee  Chronic left shoulder pain  Other abnormalities of gait and mobility  PHYSICAL THERAPY DISCHARGE SUMMARY  Visits from Start of Care: 5  Current functional level related to goals / functional outcomes: Improved pain standing and walking; improved shoulder motion    Remaining deficits: Pain with certain movements    Education / Equipment: HEP   Plan: Patient agrees to discharge.  Patient goals were met. Patient is being discharged due to being pleased with the current functional level.  ?????       Problem List Patient Active Problem List   Diagnosis Date Noted  . Moderate aortic regurgitation 07/03/2020  . Right knee pain 07/01/2017  . Tricompartmental disease of knee 04/13/2017  . [place]iatic arthritis ([place] 04/17/2016  . LVH (left ventricular hypertrophy) 02/11/2016  . [place]ending [place] aneurysm ([place]  02/11/2016  . Carotid bruit 12/31/2015  . 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 Left wrist pain 07/05/2015  . Bilateral hip pain 07/05/2015  . Leg size inequality 07/05/2015  . Right facial numbness 12/21/2013  . Anxiety and depression 05/13/2012  . Physical exam, routine 05/13/2012  . GERD (gastroesophageal reflux disease) 05/13/2012  . Seasonal allergies   . PCO (polycystic ovaries)   . Ruptured lumbar disc   . Endometriosis   . HYPERTENSION, BENIGN ESSENTIAL 05/01/2010  . NONSPEC ELEVATION OF LEVELS OF TRANSAMINASE/LDH 03/14/2009  . Severe obesity (BMI >= 40) ([place] 02/11/2009  . RHINITIS 02/11/2009  . ABDOMINAL PAIN, UNSPECIFIED SITE 02/11/2009    DCarney Living PT DPT  07/16/2020, 3:30 PM  [facility] [address]Phone: [phone]  Fax:  [phone] Name: IBONNYE HALLEMRN: [id number] of Birth: [birth year]

## 2020-07-21 ENCOUNTER — Ambulatory Visit (HOSPITAL_COMMUNITY)
Admission: EM | Admit: 2020-07-21 | Discharge: 2020-07-21 | Disposition: A | Discharge status: Home / Self Care | Payer: 59 | Attending: Family Medicine | Admitting: Family Medicine

## 2020-07-21 ENCOUNTER — Other Ambulatory Visit: Payer: Self-pay

## 2020-07-21 ENCOUNTER — Encounter (HOSPITAL_COMMUNITY): Payer: Self-pay | Admitting: *Deleted

## 2020-07-21 ENCOUNTER — Ambulatory Visit (HOSPITAL_COMMUNITY): Admit: 2020-07-21 | Disposition: A | Discharge status: Still In Hospital | Payer: 59

## 2020-07-21 DIAGNOSIS — S51811A Laceration without foreign body of right forearm, initial encounter: Secondary | ICD-10-CM

## 2020-07-21 DIAGNOSIS — W540XXA Bitten by dog, initial encounter: Secondary | ICD-10-CM | POA: Diagnosis not present

## 2020-07-21 MED ORDER — AMOXICILLIN-POT CLAVULANATE 875-125 MG PO TABS: 1.0000 | ORAL_TABLET | Freq: Two times a day (BID) | ORAL | 0 refills | Status: DC

## 2020-07-21 MED ORDER — TRAMADOL HCL 50 MG PO TABS: 50.0000 mg | ORAL_TABLET | Freq: Four times a day (QID) | ORAL | 0 refills | Status: DC | PRN

## 2020-07-21 MED ORDER — LIDOCAINE HCL 2 % IJ SOLN
INTRAMUSCULAR | Status: AC
  Filled 2020-07-21: qty 20

## 2020-07-21 NOTE — ED Provider Notes (Signed)
[place]    CSN: [id number] Arrival date & time: 07/21/20  1635      History   Chief Complaint Chief Complaint  Patient presents with  . Animal Bite    HPI Virginia Matthews is a 64 y.o. female.   HPI Patient is here for a dog bite There is a bite on her right forearm from earlier today She washed and placed a dressing on Is here for wound care This is a neighbors dog.  She checked to make sure the neighbor had appropriate vaccinations on the dog. We discussed whether she should file a police report.  Patient has psoriasis with psoriatic arthritis.  Well-controlled hypertension.  She is compliant with her medical care.  Last tetanus 2018  Past Medical History:  Diagnosis Date  . Breast mass, left 06/2016  . Carpal tunnel syndrome on both sides   . GERD (gastroesophageal reflux disease)   . Heart murmur   . Hypertension    states under control with med., has been on med. x 3-4 yr.  . LVH (left ventricular hypertrophy)    moderate, per echo 01/2016  . Moderate aortic regurgitation 07/03/2020  . Obesity   . PONV (postoperative nausea and vomiting)   . Psoriatic arthritis ([place])   . Rosacea   . Thoracic ascending aortic aneurysm [facility])     Patient Active Problem List   Diagnosis Date Noted  . Moderate aortic regurgitation 07/03/2020  . Right knee pain 07/01/2017  . Tricompartmental disease of knee 04/13/2017  . Psoriatic arthritis ([place]) 04/17/2016  . LVH (left ventricular hypertrophy) 02/11/2016  . Ascending aortic aneurysm ([place]) 02/11/2016  . Carotid bruit 12/31/2015  . 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 Left wrist pain 07/05/2015  . Bilateral hip pain 07/05/2015  . Leg size inequality 07/05/2015  . Right facial numbness 12/21/2013  . Anxiety and depression 05/13/2012  . Physical exam, routine 05/13/2012  . GERD (gastroesophageal reflux disease) 05/13/2012  . Seasonal allergies   . PCO  (polycystic ovaries)   . Ruptured lumbar disc   . Endometriosis   . HYPERTENSION, BENIGN ESSENTIAL 05/01/2010  . NONSPEC ELEVATION OF LEVELS OF TRANSAMINASE/LDH 03/14/2009  . Severe obesity (BMI >= 40) ([place]) 02/11/2009  . RHINITIS 02/11/2009  . ABDOMINAL PAIN, UNSPECIFIED SITE 02/11/2009    Past Surgical History:  Procedure Laterality Date  . BREAST EXCISIONAL BIOPSY Left    benign  . COLONOSCOPY WITH PROPOFOL  09/12/2015  . LAPAROSCOPIC ENDOMETRIOSIS FULGURATION    . MICRODISCECTOMY LUMBAR Right 07/16/2011   L4-5  . PELVIC LAPAROSCOPY     DIAG LAP  . RADIOACTIVE SEED GUIDED EXCISIONAL BREAST BIOPSY Left 07/16/2016   Procedure: LEFT RADIOACTIVE SEED GUIDED EXCISIONAL BREAST BIOPSY;  Surgeon: Rolm Bookbinder, MD;  Location: [place];  Service: General;  Laterality: Left;    OB History    Gravida  3   Para  1   Term      Preterm      AB  1   Living  1     SAB      TAB      Ectopic      Multiple      Live Births           Obstetric Comments  1. Stillbirth          Home Medications    Prior to Admission medications   Medication Sig Start Date End Date Taking? Authorizing  Provider  b complex vitamins tablet Take 1 tablet by mouth daily.   Yes [provider]  calcipotriene-betamethasone (TACLONEX) ointment Apply topically daily.   Yes [provider]  escitalopram (LEXAPRO) 10 MG tablet TAKE 1 TABLET BY MOUTH ONCE DAILY. 04/11/20  Yes Midge Minium, MD  fluticasone (FLONASE) 50 MCG/ACT nasal spray Place 2 sprays into both nostrils daily. 09/18/15  Yes Midge Minium, MD  hydrocortisone valerate cream (WESTCORT) 0.2 %  09/14/17  Yes [provider]  lisinopril (ZESTRIL) 20 MG tablet Take 1 tablet (20 mg total) by mouth daily. 07/03/20 10/01/20 Yes Skeet Latch, MD  metroNIDAZOLE (METROGEL) 0.75 % gel  10/13/17  Yes [provider]  Multiple Vitamins-Minerals (CENTRUM SILVER PO) Take 1  tablet by mouth daily. Use as directed    Yes [provider]  naproxen (NAPROSYN) 250 MG tablet Take by mouth daily.   Yes [provider]  Omega-3 Fatty Acids (FISH OIL PO) Take 1 capsule by mouth daily.   Yes [provider]  pantoprazole (PROTONIX) 40 MG tablet TAKE 1 TABLET BY MOUTH DAILY. 05/21/20  Yes Midge Minium, MD  TURMERIC PO Take 1 capsule by mouth 2 (two) times daily.   Yes [provider]  VITAMIN D, CHOLECALCIFEROL, PO Take 2,000 Units by mouth daily.    Yes [provider]  amoxicillin-clavulanate (AUGMENTIN) 875-125 MG tablet Take 1 tablet by mouth every 12 (twelve) hours. 07/21/20   Raylene Everts, MD  traMADol (ULTRAM) 50 MG tablet Take 1 tablet (50 mg total) by mouth every 6 (six) hours as needed. 07/21/20   Raylene Everts, MD  ustekinumab (STELARA) 90 MG/ML SOSY injection Inject 90 mg under the skin at week 4 then every 12 weeks as directed. 07/05/20   Tresa Garter, MD    Family History Family History  Problem Relation Age of Onset  . Breast cancer Mother   . Cancer Father        prostate cancer, lymphoma  . Heart disease Father   . Hypertension Father   . CAD Father   . Cancer Brother        prostate cancer  . Asthma Brother   . CAD Brother   . Hypertension Brother     Social History Social History   Tobacco Use  . Smoking status: Never Smoker  . Smokeless tobacco: Never Used  Vaping Use  . Vaping Use: Never used  Substance Use Topics  . Alcohol use: Yes    Alcohol/week: 0.0 standard drinks    Comment: socially  . Drug use: No     Allergies   Adhesive [tape]   Review of Systems Review of Systems See HPI  Physical Exam Triage Vital Signs ED Triage Vitals  Enc Vitals Group     BP 07/21/20 1810 (!) 158/74     Pulse Rate 07/21/20 1811 (!) 57     Resp 07/21/20 1811 16     Temp 07/21/20 1811 98.5 F (36.9 C)     Temp Source 07/21/20 1811 Oral     SpO2 07/21/20 1811 100 %      Weight --      Height --      Head Circumference --      Peak Flow --      Pain Score 07/21/20 1810 3     Pain Loc --      Pain Edu? --      Excl. in [place]? --  No data found.  Updated Vital Signs BP (!) 158/74   Pulse (!) 57   Temp 98.5 F (36.9 C) (Oral)   Resp 16   SpO2 100%      Physical Exam Constitutional:      General: She is not in acute distress.    Appearance: She is well-developed.  HENT:     Head: Normocephalic and atraumatic.     Mouth/Throat:     Comments: Mask is in place Eyes:     Conjunctiva/sclera: Conjunctivae normal.     Pupils: Pupils are equal, round, and reactive to light.  Cardiovascular:     Rate and Rhythm: Normal rate.  Pulmonary:     Effort: Pulmonary effort is normal. No respiratory distress.  Abdominal:     General: There is no distension.     Palpations: Abdomen is soft.  Musculoskeletal:        General: Normal range of motion.     Cervical back: Normal range of motion.  Skin:    General: Skin is warm and dry.     Comments: Right forearm has puncture wounds and a notable configuration consistent with dog bite.  Substantial surrounding bruising.  The 2 lower lacerations are deep and macerated.  Bleeding is stopped with pressure.  Neurological:     Mental Status: She is alert.  Psychiatric:        Mood and Affect: Mood normal.        Behavior: Behavior normal.      UC Treatments / Results  Labs (all labs ordered are listed, but only abnormal results are displayed) Labs Reviewed - No data to display  EKG   Radiology No results found.  Procedures Laceration Repair  Date/Time: 07/21/2020 7:09 PM Performed by: Raylene Everts, MD Authorized by: Raylene Everts, MD   Consent:    Consent obtained:  Verbal   Consent given by:  Patient   Risks discussed:  Infection and poor cosmetic result Anesthesia (see MAR for exact dosages):    Anesthesia method:  Local infiltration   Local anesthetic:  Lidocaine 2% w/o  epi Laceration details:    Location:  Shoulder/arm   Shoulder/arm location:  R lower arm   Length (cm):  6   Depth (mm):  25 Repair type:    Repair type:  Intermediate Pre-procedure details:    Preparation:  Patient was prepped and draped in usual sterile fashion Exploration:    Hemostasis achieved with:  Direct pressure   Wound exploration: entire depth of wound probed and visualized     Wound extent: no foreign bodies/material noted, no muscle damage noted, no nerve damage noted, no tendon damage noted and no underlying fracture noted     Contaminated: no   Treatment:    Area cleansed with:  Shur-Clens   Irrigation solution:  Sterile saline   Irrigation volume:  20   Irrigation method:  Syringe Skin repair:    Repair method:  Sutures   Suture size:  4-0   Suture material:  Nylon   Suture technique:  Simple interrupted   Number of sutures:  6 Approximation:    Approximation:  Loose Post-procedure details:    Dressing:  Non-adherent dressing and bulky dressing   Patient tolerance of procedure:  Tolerated well, no immediate complications   (including critical care time)  Medications Ordered in UC Medications - No data to display  Initial Impression / Assessment and Plan / UC Course  I have reviewed the triage vital  signs and the nursing notes.  Pertinent labs & imaging results that were available during my care of the patient were reviewed by me and considered in my medical decision making (see chart for details).     Patient is a Marine scientist.  Reviewed wound care.  Signs of infection.  Reasons for return Final Clinical Impressions(s) / UC Diagnoses   Final diagnoses:  Dog bite, initial encounter     Discharge Instructions     Take the augmentin 2 x a day Take a probiotic while on the antibiotic Take tramadol as needed for pain Leave pressure dressing on until tomorrow Change dressing once a day, wash with soap and water, and rebandage Watch for signs of  infection Sutures out in 7 to 10 days Wound may become briefly wet, do not soak    ED Prescriptions    Medication Sig Dispense Auth. Provider   amoxicillin-clavulanate (AUGMENTIN) 875-125 MG tablet Take 1 tablet by mouth every 12 (twelve) hours. 14 tablet Raylene Everts, MD   traMADol (ULTRAM) 50 MG tablet Take 1 tablet (50 mg total) by mouth every 6 (six) hours as needed. 15 tablet Raylene Everts, MD     I have reviewed the PDMP during this encounter.   Raylene Everts, MD 07/21/20 [phone]

## 2020-07-21 NOTE — ED Triage Notes (Signed)
Reports resting right forearm on fence between neighbor when the neighbor's dog got out and jumped up biting pt's right forearm @ approx 1530.  Pt states she was shown proof of rabies vaccine.  Macerated skin and lacerations noted to right forearm.  Bleeding controlled.

## 2020-07-21 NOTE — ED Notes (Signed)
Dog bite to right forearm.  Multiple breaks in skin, jagged skin, puncture wounds.  .  This occurred today just prior to arrival.  Bleeding is minimal.  Reinforced dressing after removing to look at wound.  Patient able to move arm without difficulty.  Patient reports dogs vaccine record is current.    Patient was resting forearm on top of fence, when dog pit her arm

## 2020-07-21 NOTE — Discharge Instructions (Addendum)
Take the augmentin 2 x a day Take a probiotic while on the antibiotic Take tramadol as needed for pain Leave pressure dressing on until tomorrow Change dressing once a day, wash with soap and water, and rebandage Watch for signs of infection Sutures out in 7 to 10 days Wound may become briefly wet, do not soak

## 2020-07-29 ENCOUNTER — Telehealth: Payer: Self-pay | Admitting: Family Medicine

## 2020-07-29 NOTE — Telephone Encounter (Signed)
Pt scheduled for 07/31/20 at 930 with T[facility]

## 2020-07-29 NOTE — Telephone Encounter (Signed)
Ok to use a same day on the 2nd or 3rd, per [facility] it said 7-10 days. Will let PCP decide on the Flu shot at that time based on how bite looks

## 2020-07-29 NOTE — Telephone Encounter (Signed)
pt needs to have Sutures removed in the right arm from a dog bite. They were placed by the [facility] on 07/21/20   Please advise if Birdie Riddle can remove these and pt would like to have a flu shot   Pt can be reached at the home #

## 2020-07-31 ENCOUNTER — Ambulatory Visit: Payer: 59 | Admitting: Family Medicine

## 2020-07-31 ENCOUNTER — Encounter: Payer: Self-pay | Admitting: Family Medicine

## 2020-07-31 ENCOUNTER — Other Ambulatory Visit: Payer: Self-pay

## 2020-07-31 VITALS — BP 122/80 | HR 66 | Temp 97.8°F | Resp 20 | Ht 66.0 in | Wt 284.2 lb

## 2020-07-31 DIAGNOSIS — S51851D Open bite of right forearm, subsequent encounter: Secondary | ICD-10-CM | POA: Diagnosis not present

## 2020-07-31 DIAGNOSIS — Z23 Encounter for immunization: Secondary | ICD-10-CM | POA: Diagnosis not present

## 2020-07-31 DIAGNOSIS — W540XXD Bitten by dog, subsequent encounter: Secondary | ICD-10-CM

## 2020-07-31 DIAGNOSIS — Z4802 Encounter for removal of sutures: Secondary | ICD-10-CM

## 2020-07-31 NOTE — Patient Instructions (Signed)
Follow up as needed or as scheduled Keep area clean and dry If redness or drainage worsens, please let me know ASAP Call with any questions or concerns Happy Holidays!

## 2020-07-31 NOTE — Progress Notes (Signed)
   Subjective:    Patient ID: Virginia Matthews, female    DOB: [birth year], 64 y.o.   MRN: [id number]  HPI Dog bite- pt went to ER on 10/24 w/ a dog bite to R arm.  She received 6 sutures and was treated w/ augmentin.  UTD on Tdap.  She is here today for suture removal.  Dog was known to pt and UTD on vaccines.  Minimal drainage.  Pain is manageable.     Review of Systems For ROS see HPI   This visit occurred during the SARS-CoV-2 public health emergency.  Safety protocols were in place, including screening questions prior to the visit, additional usage of staff PPE, and extensive cleaning of exam room while observing appropriate contact time as indicated for disinfecting solutions.       Objective:   Physical Exam Vitals reviewed.  Constitutional:      General: She is not in acute distress.    Appearance: Normal appearance. She is obese. She is not ill-appearing.  HENT:     Head: Normocephalic and atraumatic.  Cardiovascular:     Pulses: Normal pulses.  Skin:    General: Skin is warm and dry.     Findings: Bruising present.     Comments: Dog bite of R forearm w/ 6 sutures in place w/ scabbing and bruising present.  No fluctuance or drainage.  Pt here for suture removal, consented to procedure and tolerated w/o difficulty.  Inferior, semicircular bite was gaping somewhat so area was cleaned w/ peroxide and steristrips applied  Neurological:     Mental Status: She is alert.     Sensory: No sensory deficit.  Psychiatric:        Mood and Affect: Mood normal.        Behavior: Behavior normal.        Thought Content: Thought content normal.           Assessment & Plan:  Dog bite- new.  Thankfully no signs of infxn.  Pt tolerated suture removal w/o difficulty.  Area was cleaned w/ peroxide and steri-strips placed along inferior, semicircular bite that was gaping somewhat.  Finish Augmentin.  UTD on Tdap.  Encouraged pt to file report.

## 2020-09-03 DIAGNOSIS — Z79899 Other long term (current) drug therapy: Secondary | ICD-10-CM | POA: Diagnosis not present

## 2020-09-03 DIAGNOSIS — Z6841 Body Mass Index (BMI) 40.0 and over, adult: Secondary | ICD-10-CM | POA: Diagnosis not present

## 2020-09-03 DIAGNOSIS — L4050 Arthropathic psoriasis, unspecified: Secondary | ICD-10-CM | POA: Diagnosis not present

## 2020-09-03 DIAGNOSIS — M170 Bilateral primary osteoarthritis of knee: Secondary | ICD-10-CM | POA: Diagnosis not present

## 2020-09-03 DIAGNOSIS — L409 Psoriasis, unspecified: Secondary | ICD-10-CM | POA: Diagnosis not present

## 2020-09-03 DIAGNOSIS — R768 Other specified abnormal immunological findings in serum: Secondary | ICD-10-CM | POA: Diagnosis not present

## 2020-09-17 ENCOUNTER — Other Ambulatory Visit: Payer: Self-pay

## 2020-09-17 ENCOUNTER — Ambulatory Visit (INDEPENDENT_AMBULATORY_CARE_PROVIDER_SITE_OTHER): Payer: 59 | Admitting: Sports Medicine

## 2020-09-17 ENCOUNTER — Encounter: Payer: Self-pay | Admitting: Sports Medicine

## 2020-09-17 VITALS — BP 138/65 | Ht 66.0 in | Wt 278.0 lb

## 2020-09-17 DIAGNOSIS — M1711 Unilateral primary osteoarthritis, right knee: Secondary | ICD-10-CM | POA: Diagnosis not present

## 2020-09-17 DIAGNOSIS — G8929 Other chronic pain: Secondary | ICD-10-CM

## 2020-09-17 DIAGNOSIS — M25561 Pain in right knee: Secondary | ICD-10-CM | POA: Diagnosis not present

## 2020-09-17 NOTE — Progress Notes (Signed)
Patient ID: Virginia Matthews, female   DOB: 1956/01/28, 64 y.o.   MRN: [id number]  Patient comes in today to discuss possible cortisone injection for her right knee.  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  She does well periodically with cortisone injections.  She has an [place] trip planned in the middle of January and was considering a repeat injection today.  However, her knee has been feeling really good since her last injection a few months ago.  She has lost 40 pounds which has helped.  Physical exam was not repeated today.  We simply talked about treatment going forward.  My advice is that we wait on the cortisone injection for now.  I explained to her that injections can lose their effectiveness over time and that we should use them only when needed.  Having said that, if she finds that her pain is increasing over the next couple of weeks, she will return to the office a week or so before her scheduled trip and I would be happy to reconsider injection at that time.

## 2020-09-18 ENCOUNTER — Other Ambulatory Visit (HOSPITAL_COMMUNITY): Payer: Self-pay | Admitting: Dermatology

## 2020-09-24 ENCOUNTER — Other Ambulatory Visit: Payer: Self-pay | Admitting: Family Medicine

## 2020-10-02 ENCOUNTER — Other Ambulatory Visit: Payer: Self-pay | Admitting: Family Medicine

## 2020-10-07 ENCOUNTER — Ambulatory Visit (INDEPENDENT_AMBULATORY_CARE_PROVIDER_SITE_OTHER): Payer: 59 | Admitting: Sports Medicine

## 2020-10-07 ENCOUNTER — Other Ambulatory Visit: Payer: Self-pay

## 2020-10-07 ENCOUNTER — Other Ambulatory Visit (HOSPITAL_COMMUNITY): Payer: Self-pay | Admitting: Sports Medicine

## 2020-10-07 VITALS — BP 134/61 | Ht 66.0 in | Wt 275.0 lb

## 2020-10-07 DIAGNOSIS — M7752 Other enthesopathy of left foot: Secondary | ICD-10-CM

## 2020-10-07 MED ORDER — NAPROXEN 500 MG PO TABS: 500.0000 mg | ORAL_TABLET | Freq: Two times a day (BID) | ORAL | 0 refills | Status: DC | PRN

## 2020-10-07 MED ORDER — PREDNISONE 10 MG PO TABS: ORAL_TABLET | ORAL | 0 refills | Status: DC

## 2020-10-07 NOTE — Patient Instructions (Signed)
It was wonderful to see you today, we hope that you have fun on your trip.  For your ankle we recommend: -Ice the area for 20 minutes at a time several times a day, at least 2-3 times daily - Walking boot has been provided, to help calm this down trying to wear it for the next week would be helpful in the make sure you bring this on your trip in case you have a flare -2 prescriptions have been sent for you: Naproxen and steroid.  You can decide which you would like to start with first, do not take them at the same time.  Make sure that you take them with food.  Follow-up once return to the [place] if you continue to have difficulty.

## 2020-10-07 NOTE — Progress Notes (Signed)
    SUBJECTIVE:   CHIEF COMPLAINT / HPI: L ankle pain   Virginia Matthews is a 65 year old female presenting for evaluation of acute atraumatic left ankle pain.  She reports that this started insidiously on a Saturday, 1/8, while walking.  Present on the lateral/anterior portion of her ankle.  Worse with dorsiflexion.  Since that time she has been icing her ankle and taking naproxen 440 mg twice daily.  Denies any associated weakness, numbness/tingling, ankle instability, swelling, popping/locking.  She is leaving this Saturday, 1/15, for the [place] for 5 weeks total.  It is going to be an extremely active vacation especially in the beginning and wants to make sure that she will be able to participate.  PERTINENT  PMH / PSH: Chronic knee pain, hypertension, psoriatic arthritis, elevated BMI  OBJECTIVE:   BP 134/61   Ht 5\' 6"  (1.676 m)   Wt 275 lb (124.7 kg)   BMI 44.39 kg/m   General: Alert, NAD HEENT: NCAT, MMM Lungs: No increased WOB  Ext: Warm, dry, 2+ distal pulses  Left Ankle: - Inspection: No obvious deformity, erythema, swelling, or ecchymosis, ulcers, calluses, blisters.  - Palpation: No TTP at MT heads, no TTP at base of 5th MT, no TTP over cuboid, no tenderness over navicular prominence, no TTP over lateral or medial malleolus.  Pinpoint tenderness approximately 1 cm medial/superior to lateral malleolus. No sign of peroneal tendon subluxation or TTP. - Strength: Normal strength with dorsiflexion, plantarflexion, inversion, and eversion of foot; flexion and extension of toes.  Pain elicited in region with resisted dorsiflexion and eversion of her foot.  Rubbery sensation palpated in point of tenderness with extension of lateral toes. - ROM: Full ROM - Neuro/vasc: NV intact - Special Tests: Negative anterior drawer, normal talar tilt.    ASSESSMENT/PLAN:   Left Ankle Pain: Acute.   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  Given that she is going to be out of country for the next 5 weeks on a very active trip, provided several options for improvement.  Placed in walking boot to aid in reducing inflammation over the next week, can wear as needed.  Provided Rx for naproxen 500 mg BID and 6-day prednisone taper, gave patient the option on which to trial first pending improvement in the next few days (instructed to not use at the same time).  Encouraged frequent icing of ankle.   Follow-up when she returns if not improving.  Patriciaann Clan, [place]   Patient seen and evaluated with the resident.  I agree with the above plan of care.  Physical exam shows palpable crepitus with active firing of the extensor digitorum tendon and muscle group.  Ultrasound shows swelling in this area as well.  We will treat with prescription strength naproxen sodium but I will also give her a prescription for prednisone to take with her overseas.  She will wear a walking boot as needed for comfort.  Hopefully she will be able to improve quickly and enjoy her vacation.  Follow-up for ongoing or recalcitrant issues

## 2020-10-08 ENCOUNTER — Other Ambulatory Visit: Payer: Self-pay | Admitting: Family Medicine

## 2020-11-20 ENCOUNTER — Encounter: Payer: Self-pay | Admitting: Family Medicine

## 2020-11-20 ENCOUNTER — Other Ambulatory Visit: Payer: Self-pay

## 2020-11-20 ENCOUNTER — Ambulatory Visit (INDEPENDENT_AMBULATORY_CARE_PROVIDER_SITE_OTHER): Payer: 59 | Admitting: Family Medicine

## 2020-11-20 VITALS — BP 122/78 | HR 58 | Temp 98.4°F | Resp 19 | Ht 66.0 in | Wt 284.4 lb

## 2020-11-20 DIAGNOSIS — Z0000 Encounter for general adult medical examination without abnormal findings: Secondary | ICD-10-CM

## 2020-11-20 DIAGNOSIS — L4050 Arthropathic psoriasis, unspecified: Secondary | ICD-10-CM | POA: Diagnosis not present

## 2020-11-20 LAB — CBC WITH DIFFERENTIAL/PLATELET
Basophils Absolute: 0.1 10*3/uL (ref 0.0–0.1)
Basophils Relative: 0.9 % (ref 0.0–3.0)
Eosinophils Absolute: 0.4 10*3/uL (ref 0.0–0.7)
Eosinophils Relative: 5 % (ref 0.0–5.0)
HCT: 38.1 % (ref 36.0–46.0)
Hemoglobin: 12.9 g/dL (ref 12.0–15.0)
Lymphocytes Relative: 18 % (ref 12.0–46.0)
Lymphs Abs: 1.4 10*3/uL (ref 0.7–4.0)
MCHC: 33.9 g/dL (ref 30.0–36.0)
MCV: 93 fl (ref 78.0–100.0)
Monocytes Absolute: 0.5 10*3/uL (ref 0.1–1.0)
Monocytes Relative: 6.2 % (ref 3.0–12.0)
Neutro Abs: 5.3 10*3/uL (ref 1.4–7.7)
Neutrophils Relative %: 69.9 % (ref 43.0–77.0)
Platelets: 174 10*3/uL (ref 150.0–400.0)
RBC: 4.1 Mil/uL (ref 3.87–5.11)
RDW: 14.5 % (ref 11.5–15.5)
WBC: 7.6 10*3/uL (ref 4.0–10.5)

## 2020-11-20 LAB — HEPATIC FUNCTION PANEL
ALT: 24 U/L (ref 0–35)
AST: 29 U/L (ref 0–37)
Albumin: 4 g/dL (ref 3.5–5.2)
Alkaline Phosphatase: 71 U/L (ref 39–117)
Bilirubin, Direct: 0.1 mg/dL (ref 0.0–0.3)
Total Bilirubin: 0.5 mg/dL (ref 0.2–1.2)
Total Protein: 7.4 g/dL (ref 6.0–8.3)

## 2020-11-20 LAB — BASIC METABOLIC PANEL
BUN: 21 mg/dL (ref 6–23)
CO2: 31 mEq/L (ref 19–32)
Calcium: 9.7 mg/dL (ref 8.4–10.5)
Chloride: 102 mEq/L (ref 96–112)
Creatinine, Ser: 0.8 mg/dL (ref 0.40–1.20)
GFR: 77.77 mL/min (ref >60.00)
Glucose, Bld: 86 mg/dL (ref 70–99)
Potassium: 4.3 mEq/L (ref 3.5–5.1)
Sodium: 139 mEq/L (ref 135–145)

## 2020-11-20 LAB — LIPID PANEL
Cholesterol: 173 mg/dL (ref 0–200)
HDL: 65.2 mg/dL (ref >39.00)
LDL Cholesterol: 97 mg/dL (ref 0–99)
NonHDL: 107.77
Total CHOL/HDL Ratio: 3
Triglycerides: 56 mg/dL (ref 0.0–149.0)
VLDL: 11.2 mg/dL (ref 0.0–40.0)

## 2020-11-20 LAB — TSH: TSH: 2.52 u[IU]/mL (ref 0.35–4.50)

## 2020-11-20 NOTE — Assessment & Plan Note (Signed)
Pt's lost 15 lbs since last visit.  Applauded her efforts.  Encouraged her to continue healthy diet and regular exercise.  Check labs to risk stratify.  Will follow.

## 2020-11-20 NOTE — Progress Notes (Signed)
Subjective:    Patient ID: Virginia Matthews, female    DOB: 1956-02-03, 65 y.o.   MRN: [id number]  HPI CPE- UTD on mammo, colonoscopy, Tdap, COVID, flu.  Due for pap.  No concerns today  Reviewed past medical, surgical, family and social histories.   Patient Care Team    Relationship Specialty Notifications Start End  Midge Minium, MD PCP - General   09/10/10    Comment: Catalina Pizza, MD PCP - Cardiology Cardiology  11/29/19   Kennith Center, RD Dietitian Family Medicine  01/01/15   Jayleen Shipper, MD Consulting Physician Gastroenterology  01/03/15   Gavin Pound, MD Consulting Physician Rheumatology  04/17/16   Rolm Bookbinder, MD Consulting Physician Dermatology  04/17/16   Princess Bruins, MD Consulting Physician Obstetrics and Gynecology  05/10/18     Health Maintenance  Topic Date Due  . PAP SMEAR-Modifier  05/11/2020  . HIV Screening  05/20/2021 (Originally 04/15/1971)  . MAMMOGRAM  02/01/2021  . COLONOSCOPY (Pts 45-74yrs Insurance coverage will need to be confirmed)  09/11/2025  . TETANUS/TDAP  04/21/2027  . INFLUENZA VACCINE  Completed  . COVID-19 Vaccine  Completed  . Hepatitis C Screening  Completed      Review of Systems Patient reports no vision/ hearing changes, adenopathy,fever, persistant/recurrent hoarseness , swallowing issues, chest pain, palpitations, edema, persistant/recurrent cough, hemoptysis, gastrointestinal bleeding (melena, rectal bleeding), abdominal pain, significant heartburn, bowel changes, GU symptoms (dysuria, hematuria, incontinence), Gyn symptoms (abnormal  bleeding, pain),  syncope, focal weakness, memory loss, numbness & tingling, skin/hair/nail changes, abnormal bruising or bleeding, anxiety, or depression.   + 15 lb weight loss since August- has decreased intake and is exercising more regularly + SOB w/ exertion  This visit occurred during the SARS-CoV-2 public health emergency.  Safety protocols were in place, including  screening questions prior to the visit, additional usage of staff PPE, and extensive cleaning of exam room while observing appropriate contact time as indicated for disinfecting solutions.       Objective:   Physical Exam General Appearance:    Alert, cooperative, no distress, appears stated age, obese  Head:    Normocephalic, without obvious abnormality, atraumatic  Eyes:    PERRL, conjunctiva/corneas clear, EOM's intact, fundi    benign, both eyes  Ears:    Normal TM's and external ear canals, both ears  Nose:   Nares normal, septum midline, mucosa normal, no drainage    or sinus tenderness  Throat:   Lips, mucosa, and tongue normal; teeth and gums normal  Neck:   Supple, symmetrical, trachea midline, no adenopathy;    Thyroid: no enlargement/tenderness/nodules  Back:     Symmetric, no curvature, ROM normal, no CVA tenderness  Lungs:     Clear to auscultation bilaterally, respirations unlabored  Chest Wall:    No tenderness or deformity   Heart:    Regular rate and rhythm, S1 and S2 normal, no murmur, rub   or gallop  Breast Exam:    Deferred to GYN  Abdomen:     Soft, non-tender, bowel sounds active all four quadrants,    no masses, no organomegaly  Genitalia:    Deferred to GYN  Rectal:    Extremities:   Extremities normal, atraumatic, no cyanosis or edema  Pulses:   2+ and symmetric all extremities  Skin:   Skin color, texture, turgor normal, no rashes or lesions  Lymph nodes:   Cervical, supraclavicular, and axillary nodes normal  Neurologic:  CNII-XII intact, normal strength, sensation and reflexes    throughout          Assessment & Plan:

## 2020-11-20 NOTE — Patient Instructions (Signed)
Follow up in 6 months to recheck BP and weight loss progress We'll notify you of your lab results and make any changes if needed Continue to work on healthy diet and regular exercise- you're doing great!!! Call and schedule your pap smear at your convenience Call with any questions or concerns Stay Safe!  Stay Healthy!

## 2020-11-20 NOTE — Assessment & Plan Note (Signed)
Currently on Stelara and this is working better for pt.  Following w/ Dr Lenna Gilford

## 2020-11-20 NOTE — Assessment & Plan Note (Signed)
Pt's PE WNL w/ exception of obesity.  UTD on mammo, colonoscopy, immunizations.  Due for pap- pt to scheduled.  Check labs.  Anticipatory guidance provided.

## 2020-12-24 ENCOUNTER — Other Ambulatory Visit (HOSPITAL_COMMUNITY): Payer: Self-pay

## 2020-12-30 ENCOUNTER — Ambulatory Visit: Payer: 59

## 2020-12-30 ENCOUNTER — Other Ambulatory Visit (HOSPITAL_COMMUNITY): Payer: Self-pay

## 2020-12-30 MED FILL — Escitalopram Oxalate Tab 10 MG (Base Equiv): ORAL | 90 days supply | Qty: 90 | Fill #0 | Status: AC

## 2020-12-31 ENCOUNTER — Ambulatory Visit: Payer: 59

## 2021-01-01 ENCOUNTER — Other Ambulatory Visit (HOSPITAL_COMMUNITY): Payer: Self-pay

## 2021-01-01 MED FILL — Metronidazole Gel 0.75%: CUTANEOUS | 30 days supply | Qty: 45 | Fill #0 | Status: AC

## 2021-01-01 MED FILL — Clobetasol Propionate Oint 0.05%: CUTANEOUS | 21 days supply | Qty: 60 | Fill #0 | Status: CN

## 2021-01-02 DIAGNOSIS — L409 Psoriasis, unspecified: Secondary | ICD-10-CM | POA: Diagnosis not present

## 2021-01-02 DIAGNOSIS — M170 Bilateral primary osteoarthritis of knee: Secondary | ICD-10-CM | POA: Diagnosis not present

## 2021-01-02 DIAGNOSIS — Z6841 Body Mass Index (BMI) 40.0 and over, adult: Secondary | ICD-10-CM | POA: Diagnosis not present

## 2021-01-02 DIAGNOSIS — Z79899 Other long term (current) drug therapy: Secondary | ICD-10-CM | POA: Diagnosis not present

## 2021-01-02 DIAGNOSIS — R768 Other specified abnormal immunological findings in serum: Secondary | ICD-10-CM | POA: Diagnosis not present

## 2021-01-02 DIAGNOSIS — L4050 Arthropathic psoriasis, unspecified: Secondary | ICD-10-CM | POA: Diagnosis not present

## 2021-01-03 ENCOUNTER — Other Ambulatory Visit (HOSPITAL_COMMUNITY): Payer: Self-pay

## 2021-01-27 ENCOUNTER — Other Ambulatory Visit: Payer: Self-pay | Admitting: Family Medicine

## 2021-01-27 DIAGNOSIS — Z1231 Encounter for screening mammogram for malignant neoplasm of breast: Secondary | ICD-10-CM

## 2021-01-30 ENCOUNTER — Ambulatory Visit: Payer: 59 | Attending: Internal Medicine

## 2021-01-30 ENCOUNTER — Other Ambulatory Visit: Payer: Self-pay

## 2021-01-30 ENCOUNTER — Other Ambulatory Visit (HOSPITAL_BASED_OUTPATIENT_CLINIC_OR_DEPARTMENT_OTHER): Payer: Self-pay

## 2021-01-30 DIAGNOSIS — Z23 Encounter for immunization: Secondary | ICD-10-CM

## 2021-01-30 MED ORDER — PFIZER-BIONT COVID-19 VAC-TRIS 30 MCG/0.3ML IM SUSP
INTRAMUSCULAR | 0 refills | Status: DC
  Filled 2021-01-30: qty 0.3, 1d supply, fill #0

## 2021-01-30 NOTE — Progress Notes (Signed)
   Covid-19 Vaccination Clinic  Name:  EVAMARIA DETORE    MRN: [id number] DOB: January 03, 1956  01/30/2021  Ms. Fluegge-Carbone was observed post Covid-19 immunization for 15 minutes without incident. She was provided with Vaccine Information Sheet and instruction to access the V-Safe system.   [name] was instructed to call 911 with any severe reactions post vaccine: Marland Kitchen Difficulty breathing  . Swelling of face and throat  . A fast heartbeat  . A bad rash all over body  . Dizziness and weakness   Immunizations Administered    Name Date Dose VIS Date Route   PFIZER Comrnaty(Gray TOP) Covid-19 Vaccine 01/30/2021 10:07 AM 0.3 mL 09/05/2020 Intramuscular   Manufacturer: [place]   Lot: [id number]   NDC: [phone]

## 2021-02-04 ENCOUNTER — Other Ambulatory Visit: Payer: Self-pay

## 2021-02-04 ENCOUNTER — Ambulatory Visit (HOSPITAL_COMMUNITY): Payer: 59 | Attending: Cardiology

## 2021-02-04 DIAGNOSIS — I359 Nonrheumatic aortic valve disorder, unspecified: Secondary | ICD-10-CM | POA: Insufficient documentation

## 2021-02-04 DIAGNOSIS — I712 Thoracic aortic aneurysm, without rupture: Secondary | ICD-10-CM | POA: Diagnosis not present

## 2021-02-04 DIAGNOSIS — I7121 Aneurysm of the ascending aorta, without rupture: Secondary | ICD-10-CM

## 2021-02-04 DIAGNOSIS — I517 Cardiomegaly: Secondary | ICD-10-CM | POA: Insufficient documentation

## 2021-02-04 LAB — ECHOCARDIOGRAM COMPLETE
Area-P 1/2: 3.12 cm2
P 1/2 time: 634 msec
S' Lateral: 3.9 cm

## 2021-02-06 ENCOUNTER — Telehealth: Payer: Self-pay | Admitting: Cardiovascular Disease

## 2021-02-06 NOTE — Telephone Encounter (Signed)
PT is returning a call about ECHO results

## 2021-02-06 NOTE — Telephone Encounter (Signed)
Spoke with pt regarding echo results. The patient has been notified of the result and verbalized understanding.  All questions (if any) were answered. Beatrix Fetters, RN 02/06/2021 4:00 PM

## 2021-02-13 ENCOUNTER — Other Ambulatory Visit (HOSPITAL_COMMUNITY): Payer: Self-pay

## 2021-02-13 MED FILL — Pantoprazole Sodium EC Tab 40 MG (Base Equiv): ORAL | 90 days supply | Qty: 90 | Fill #0 | Status: AC

## 2021-02-14 ENCOUNTER — Other Ambulatory Visit (HOSPITAL_COMMUNITY): Payer: Self-pay

## 2021-02-17 ENCOUNTER — Other Ambulatory Visit (HOSPITAL_COMMUNITY): Payer: Self-pay

## 2021-02-17 MED FILL — Ustekinumab Soln Prefilled Syringe 90 MG/ML: SUBCUTANEOUS | 84 days supply | Qty: 1 | Fill #0 | Status: CN

## 2021-02-18 ENCOUNTER — Other Ambulatory Visit (HOSPITAL_COMMUNITY): Payer: Self-pay

## 2021-02-18 MED FILL — Ustekinumab Soln Prefilled Syringe 90 MG/ML: SUBCUTANEOUS | 84 days supply | Qty: 1 | Fill #0 | Status: CN

## 2021-02-20 ENCOUNTER — Other Ambulatory Visit (HOSPITAL_COMMUNITY): Payer: Self-pay

## 2021-02-26 ENCOUNTER — Other Ambulatory Visit (HOSPITAL_COMMUNITY): Payer: Self-pay

## 2021-02-26 MED FILL — Ustekinumab Soln Prefilled Syringe 90 MG/ML: SUBCUTANEOUS | 84 days supply | Qty: 1 | Fill #0 | Status: CN

## 2021-03-03 ENCOUNTER — Other Ambulatory Visit: Payer: Self-pay | Admitting: Pharmacist

## 2021-03-03 ENCOUNTER — Other Ambulatory Visit (HOSPITAL_COMMUNITY): Payer: Self-pay

## 2021-03-03 MED ORDER — STELARA 90 MG/ML ~~LOC~~ SOSY: PREFILLED_SYRINGE | SUBCUTANEOUS | 1 refills | Status: DC

## 2021-03-03 MED ORDER — STELARA 90 MG/ML ~~LOC~~ SOSY
PREFILLED_SYRINGE | SUBCUTANEOUS | 1 refills | Status: DC
  Filled 2021-03-03: qty 1, fill #0
  Filled 2021-03-03: qty 1, 84d supply, fill #0
  Filled 2021-05-21 – 2021-05-26 (×2): qty 1, 84d supply, fill #1

## 2021-03-03 MED FILL — Ustekinumab Soln Prefilled Syringe 90 MG/ML: SUBCUTANEOUS | 84 days supply | Qty: 1 | Fill #0 | Status: CN

## 2021-03-04 ENCOUNTER — Other Ambulatory Visit (HOSPITAL_COMMUNITY): Payer: Self-pay

## 2021-03-14 ENCOUNTER — Other Ambulatory Visit: Payer: Self-pay | Admitting: Family Medicine

## 2021-03-14 ENCOUNTER — Other Ambulatory Visit (HOSPITAL_COMMUNITY): Payer: Self-pay

## 2021-03-14 MED ORDER — ESCITALOPRAM OXALATE 10 MG PO TABS
ORAL_TABLET | Freq: Every day | ORAL | 0 refills | Status: DC
Start: 2021-03-14 — End: 2021-04-10
  Filled 2021-03-14: qty 30, 30d supply, fill #0

## 2021-03-14 MED FILL — Lisinopril Tab 20 MG: ORAL | 90 days supply | Qty: 90 | Fill #0 | Status: AC

## 2021-03-18 ENCOUNTER — Other Ambulatory Visit (HOSPITAL_COMMUNITY): Payer: Self-pay

## 2021-03-19 ENCOUNTER — Other Ambulatory Visit: Payer: Self-pay

## 2021-03-19 ENCOUNTER — Ambulatory Visit
Admission: RE | Admit: 2021-03-19 | Discharge: 2021-03-19 | Disposition: A | Discharge status: Home / Self Care | Payer: 59 | Source: Ambulatory Visit | Attending: Family Medicine | Admitting: Family Medicine

## 2021-03-19 DIAGNOSIS — Z1231 Encounter for screening mammogram for malignant neoplasm of breast: Secondary | ICD-10-CM | POA: Diagnosis not present

## 2021-03-21 ENCOUNTER — Other Ambulatory Visit (HOSPITAL_COMMUNITY): Payer: Self-pay

## 2021-03-21 MED FILL — Metronidazole Gel 0.75%: CUTANEOUS | 30 days supply | Qty: 45 | Fill #1 | Status: AC

## 2021-03-25 NOTE — Telephone Encounter (Signed)
This encounter was created in error - please disregard.

## 2021-03-26 ENCOUNTER — Encounter: Payer: Self-pay | Admitting: *Deleted

## 2021-04-10 ENCOUNTER — Other Ambulatory Visit: Payer: Self-pay | Admitting: Family Medicine

## 2021-04-11 ENCOUNTER — Other Ambulatory Visit (HOSPITAL_COMMUNITY): Payer: Self-pay

## 2021-04-11 MED ORDER — ESCITALOPRAM OXALATE 10 MG PO TABS
ORAL_TABLET | Freq: Every day | ORAL | 2 refills | Status: DC
  Filled 2021-04-11: qty 30, 30d supply, fill #0
  Filled 2021-05-31 – 2021-06-11 (×2): qty 30, 30d supply, fill #1
  Filled 2021-07-14: qty 30, 30d supply, fill #2

## 2021-04-11 MED ORDER — PANTOPRAZOLE SODIUM 40 MG PO TBEC
DELAYED_RELEASE_TABLET | Freq: Every day | ORAL | 1 refills | Status: DC
Start: 2021-04-11 — End: 2021-11-17
  Filled 2021-04-11 – 2021-05-17 (×2): qty 90, 90d supply, fill #0
  Filled 2021-08-11: qty 90, 90d supply, fill #1

## 2021-04-16 ENCOUNTER — Other Ambulatory Visit (HOSPITAL_COMMUNITY): Payer: Self-pay

## 2021-04-22 ENCOUNTER — Telehealth: Payer: Self-pay | Admitting: Family Medicine

## 2021-04-22 ENCOUNTER — Other Ambulatory Visit (HOSPITAL_COMMUNITY): Payer: Self-pay

## 2021-04-22 DIAGNOSIS — Z79899 Other long term (current) drug therapy: Secondary | ICD-10-CM | POA: Diagnosis not present

## 2021-04-22 DIAGNOSIS — M170 Bilateral primary osteoarthritis of knee: Secondary | ICD-10-CM | POA: Diagnosis not present

## 2021-04-22 DIAGNOSIS — L4050 Arthropathic psoriasis, unspecified: Secondary | ICD-10-CM | POA: Diagnosis not present

## 2021-04-22 DIAGNOSIS — R768 Other specified abnormal immunological findings in serum: Secondary | ICD-10-CM | POA: Diagnosis not present

## 2021-04-22 DIAGNOSIS — R5383 Other fatigue: Secondary | ICD-10-CM | POA: Diagnosis not present

## 2021-04-22 DIAGNOSIS — L409 Psoriasis, unspecified: Secondary | ICD-10-CM | POA: Diagnosis not present

## 2021-04-22 DIAGNOSIS — Z6841 Body Mass Index (BMI) 40.0 and over, adult: Secondary | ICD-10-CM | POA: Diagnosis not present

## 2021-04-29 DIAGNOSIS — U071 COVID-19: Secondary | ICD-10-CM | POA: Diagnosis not present

## 2021-05-17 ENCOUNTER — Other Ambulatory Visit (HOSPITAL_COMMUNITY): Payer: Self-pay

## 2021-05-19 NOTE — Telephone Encounter (Signed)
Encounter started in error.

## 2021-05-20 ENCOUNTER — Ambulatory Visit: Payer: 59 | Admitting: Family Medicine

## 2021-05-21 ENCOUNTER — Other Ambulatory Visit (HOSPITAL_COMMUNITY): Payer: Self-pay

## 2021-05-26 ENCOUNTER — Other Ambulatory Visit (HOSPITAL_COMMUNITY): Payer: Self-pay

## 2021-05-28 ENCOUNTER — Ambulatory Visit: Payer: 59 | Admitting: Family Medicine

## 2021-05-31 ENCOUNTER — Other Ambulatory Visit (HOSPITAL_COMMUNITY): Payer: Self-pay

## 2021-06-04 ENCOUNTER — Other Ambulatory Visit (HOSPITAL_COMMUNITY): Payer: Self-pay

## 2021-06-09 ENCOUNTER — Other Ambulatory Visit (HOSPITAL_COMMUNITY): Payer: Self-pay

## 2021-06-10 ENCOUNTER — Other Ambulatory Visit (HOSPITAL_COMMUNITY): Payer: Self-pay

## 2021-06-11 ENCOUNTER — Other Ambulatory Visit (HOSPITAL_COMMUNITY): Payer: Self-pay

## 2021-06-20 ENCOUNTER — Ambulatory Visit: Payer: 59 | Admitting: Family Medicine

## 2021-07-07 ENCOUNTER — Other Ambulatory Visit: Payer: Self-pay | Admitting: Cardiovascular Disease

## 2021-07-07 ENCOUNTER — Other Ambulatory Visit (HOSPITAL_COMMUNITY): Payer: Self-pay

## 2021-07-07 MED ORDER — LISINOPRIL 20 MG PO TABS
20.0000 mg | ORAL_TABLET | Freq: Every day | ORAL | 0 refills | Status: DC
  Filled 2021-07-07: qty 30, 30d supply, fill #0

## 2021-07-07 NOTE — Telephone Encounter (Signed)
Rx(s) sent to pharmacy electronically.  

## 2021-07-10 ENCOUNTER — Other Ambulatory Visit (HOSPITAL_BASED_OUTPATIENT_CLINIC_OR_DEPARTMENT_OTHER): Payer: Self-pay

## 2021-07-10 MED ORDER — INFLUENZA VAC A&B SA ADJ QUAD 0.5 ML IM PRSY
PREFILLED_SYRINGE | INTRAMUSCULAR | 0 refills | Status: DC
Start: 2021-07-10 — End: 2021-07-29
  Filled 2021-07-10: qty 0.5, 1d supply, fill #0

## 2021-07-14 ENCOUNTER — Other Ambulatory Visit (HOSPITAL_COMMUNITY): Payer: Self-pay

## 2021-07-15 ENCOUNTER — Ambulatory Visit (INDEPENDENT_AMBULATORY_CARE_PROVIDER_SITE_OTHER): Payer: Medicare Other | Admitting: Family Medicine

## 2021-07-15 ENCOUNTER — Other Ambulatory Visit: Payer: Self-pay

## 2021-07-15 DIAGNOSIS — Z23 Encounter for immunization: Secondary | ICD-10-CM | POA: Diagnosis not present

## 2021-07-15 DIAGNOSIS — M1711 Unilateral primary osteoarthritis, right knee: Secondary | ICD-10-CM

## 2021-07-15 DIAGNOSIS — G8929 Other chronic pain: Secondary | ICD-10-CM

## 2021-07-15 NOTE — Progress Notes (Signed)
After obtaining consent, and per orders of Dr. Birdie Riddle, injection of Flu shot given by Juliann Pulse.

## 2021-07-18 ENCOUNTER — Ambulatory Visit
Admission: RE | Admit: 2021-07-18 | Discharge: 2021-07-18 | Disposition: A | Discharge status: Home / Self Care | Payer: Medicare Other | Source: Ambulatory Visit | Attending: Sports Medicine | Admitting: Sports Medicine

## 2021-07-18 DIAGNOSIS — M25562 Pain in left knee: Secondary | ICD-10-CM

## 2021-07-18 DIAGNOSIS — G8929 Other chronic pain: Secondary | ICD-10-CM

## 2021-07-18 NOTE — Addendum Note (Signed)
Addended by: Jolinda Croak E on: 07/18/2021 11:40 AM   Modules accepted: Orders

## 2021-07-22 ENCOUNTER — Ambulatory Visit (INDEPENDENT_AMBULATORY_CARE_PROVIDER_SITE_OTHER): Payer: Medicare Other | Admitting: Sports Medicine

## 2021-07-22 VITALS — BP 137/92 | Ht 66.0 in | Wt 290.0 lb

## 2021-07-22 DIAGNOSIS — M1712 Unilateral primary osteoarthritis, left knee: Secondary | ICD-10-CM | POA: Diagnosis not present

## 2021-07-22 MED ORDER — METHYLPREDNISOLONE ACETATE 40 MG/ML IJ SUSP
40.0000 mg | Freq: Once | INTRAMUSCULAR | Status: AC
  Administered 2021-07-22: 40 mg via INTRA_ARTICULAR

## 2021-07-23 NOTE — Progress Notes (Signed)
   Subjective:    Patient ID: Virginia Matthews, female    DOB: 21-Nov-1955, 65 y.o.   MRN: [id number]  HPI chief complaint: Left knee pain  Cynda presents today with left knee pain.  Pain is been worse over the past month or so.  No recent trauma she can recall.  She has a history of right knee DJD which is responded well to cortisone injections in the past.  Her left knee pain is primarily in the medial knee.  She does feel some instability at times.  She has started exercising more including walking and strength training.  She does not endorse any significant increase in pain with either of these activities.  Interim medical history reviewed Medications reviewed Allergies reviewed    Review of Systems As above    Objective:   Physical Exam  Left knee: Range of motion is 0 to about 100 degrees.  1+ boggy synovitis.  She is tender to palpation along the medial joint line with pain but no popping with McMurray's.  No significant tenderness laterally.  Knee is grossly stable ligamentous exam.  Neurovascularly intact distally.  X-rays of the left knee including AP, lateral, sunrise, and 30 degree flexion views show moderately advanced medial compartmental DJD.  She is approaching bone-on-bone in this compartment.  She has mild degenerative changes in the patellofemoral joint as well.  Nothing acute.       Assessment & Plan:   Left knee pain secondary to advanced medial compartmental DJD  I recommended that we try cortisone injection today.  This was accomplished atraumatically under sterile technique utilizing an anterior lateral approach.  She tolerates this without difficulty.  I think she is okay to continue with activity using pain as her guide but she will pay close attention to any sort of closed chain lower body strengthening exercise just to make sure that those particular exercises do not increase her discomfort.  If she does not receive any significant benefit from today's  injection then we could consider viscosupplementation.  Follow-up for ongoing or recalcitrant issues.  Consent obtained and verified. Time-out conducted. Noted no overlying erythema, induration, or other signs of local infection. Skin prepped in a sterile fashion. Topical analgesic spray: Ethyl chloride. Joint: Left knee Needle: 25-gauge 1.5 inch Completed without difficulty. Meds: 3 cc 1% Xylocaine, 1 cc (40 mg) Depo-Medrol  Advised to call if fevers/chills, erythema, induration, drainage, or persistent bleeding.   This note was dictated using Dragon naturally speaking software and may contain errors in syntax, spelling, or content which have not been identified prior to signing this note.

## 2021-07-29 ENCOUNTER — Other Ambulatory Visit: Payer: Self-pay

## 2021-07-29 ENCOUNTER — Encounter: Payer: Self-pay | Admitting: Family Medicine

## 2021-07-29 ENCOUNTER — Ambulatory Visit (INDEPENDENT_AMBULATORY_CARE_PROVIDER_SITE_OTHER): Payer: Medicare Other | Admitting: Family Medicine

## 2021-07-29 VITALS — BP 124/88 | HR 60 | Temp 98.4°F | Resp 16 | Wt 293.8 lb

## 2021-07-29 DIAGNOSIS — F32A Depression, unspecified: Secondary | ICD-10-CM | POA: Diagnosis not present

## 2021-07-29 DIAGNOSIS — L409 Psoriasis, unspecified: Secondary | ICD-10-CM | POA: Diagnosis not present

## 2021-07-29 DIAGNOSIS — F419 Anxiety disorder, unspecified: Secondary | ICD-10-CM | POA: Diagnosis not present

## 2021-07-29 DIAGNOSIS — R768 Other specified abnormal immunological findings in serum: Secondary | ICD-10-CM | POA: Diagnosis not present

## 2021-07-29 DIAGNOSIS — Z6841 Body Mass Index (BMI) 40.0 and over, adult: Secondary | ICD-10-CM | POA: Diagnosis not present

## 2021-07-29 DIAGNOSIS — L4050 Arthropathic psoriasis, unspecified: Secondary | ICD-10-CM | POA: Diagnosis not present

## 2021-07-29 DIAGNOSIS — I10 Essential (primary) hypertension: Secondary | ICD-10-CM | POA: Diagnosis not present

## 2021-07-29 DIAGNOSIS — Z79899 Other long term (current) drug therapy: Secondary | ICD-10-CM | POA: Diagnosis not present

## 2021-07-29 DIAGNOSIS — M170 Bilateral primary osteoarthritis of knee: Secondary | ICD-10-CM | POA: Diagnosis not present

## 2021-07-29 LAB — CBC WITH DIFFERENTIAL/PLATELET
Basophils Absolute: 0.1 10*3/uL (ref 0.0–0.1)
Basophils Relative: 1 % (ref 0.0–3.0)
Eosinophils Absolute: 0.6 10*3/uL (ref 0.0–0.7)
Eosinophils Relative: 7 % — ABNORMAL HIGH (ref 0.0–5.0)
HCT: 40.2 % (ref 36.0–46.0)
Hemoglobin: 13.3 g/dL (ref 12.0–15.0)
Lymphocytes Relative: 20.5 % (ref 12.0–46.0)
Lymphs Abs: 1.8 10*3/uL (ref 0.7–4.0)
MCHC: 33.1 g/dL (ref 30.0–36.0)
MCV: 91.3 fl (ref 78.0–100.0)
Monocytes Absolute: 0.6 10*3/uL (ref 0.1–1.0)
Monocytes Relative: 6.8 % (ref 3.0–12.0)
Neutro Abs: 5.7 10*3/uL (ref 1.4–7.7)
Neutrophils Relative %: 64.7 % (ref 43.0–77.0)
Platelets: 219 10*3/uL (ref 150.0–400.0)
RBC: 4.4 Mil/uL (ref 3.87–5.11)
RDW: 14 % (ref 11.5–15.5)
WBC: 8.8 10*3/uL (ref 4.0–10.5)

## 2021-07-29 LAB — LIPID PANEL
Cholesterol: 180 mg/dL (ref 0–200)
HDL: 53.5 mg/dL (ref >39.00)
LDL Cholesterol: 95 mg/dL (ref 0–99)
NonHDL: 126.87
Total CHOL/HDL Ratio: 3
Triglycerides: 161 mg/dL — ABNORMAL HIGH (ref 0.0–149.0)
VLDL: 32.2 mg/dL (ref 0.0–40.0)

## 2021-07-29 LAB — BASIC METABOLIC PANEL
BUN: 20 mg/dL (ref 6–23)
CO2: 30 mEq/L (ref 19–32)
Calcium: 9.7 mg/dL (ref 8.4–10.5)
Chloride: 101 mEq/L (ref 96–112)
Creatinine, Ser: 0.86 mg/dL (ref 0.40–1.20)
GFR: 70.96 mL/min (ref >60.00)
Glucose, Bld: 98 mg/dL (ref 70–99)
Potassium: 4.6 mEq/L (ref 3.5–5.1)
Sodium: 138 mEq/L (ref 135–145)

## 2021-07-29 LAB — HEPATIC FUNCTION PANEL
ALT: 22 U/L (ref 0–35)
AST: 22 U/L (ref 0–37)
Albumin: 4.1 g/dL (ref 3.5–5.2)
Alkaline Phosphatase: 68 U/L (ref 39–117)
Bilirubin, Direct: 0 mg/dL (ref 0.0–0.3)
Total Bilirubin: 0.4 mg/dL (ref 0.2–1.2)
Total Protein: 7.3 g/dL (ref 6.0–8.3)

## 2021-07-29 LAB — TSH: TSH: 2.68 u[IU]/mL (ref 0.35–5.50)

## 2021-07-29 NOTE — Assessment & Plan Note (Signed)
Deteriorated.  Pt has gained 10 lbs since CPE in Feb.  She admits to emotional eating and drinking due to increased stress w/ husband's poor health.  Has started weight training recently- applauded her efforts.  Check labs to risk stratify.  Will follow.

## 2021-07-29 NOTE — Assessment & Plan Note (Signed)
Ongoing issue for pt.  Currently on Lexapro 10mg  daily.  She is not interested in making changes at this time but is aware that she can reach out if she changes her mind.  Pt expressed understanding and is in agreement w/ plan.

## 2021-07-29 NOTE — Progress Notes (Signed)
   Subjective:    Patient ID: Virginia Matthews, female    DOB: [birth year], 65 y.o.   MRN: [id number]  HPI HTN- chronic problem, on Lisinopril 20mg  daily w/ good control.  No CP, SOB, HAs, visual changes, edema.  Obesity- pt's BMI is 47.42.  She has gained 10 lbs since CPE in Feb.  Husband has been sick and in and out of hospital so she has not been taking care of herself.    Depression- ongoing issue for pt.  She is currently on Lexapro 10mg  daily.  She doesn't feel that meds need to be changed.  Feels things are situational and no need to intervene.   Review of Systems For ROS see HPI   This visit occurred during the SARS-CoV-2 public health emergency.  Safety protocols were in place, including screening questions prior to the visit, additional usage of staff PPE, and extensive cleaning of exam room while observing appropriate contact time as indicated for disinfecting solutions.      Objective:   Physical Exam Vitals reviewed.  Constitutional:      General: She is not in acute distress.    Appearance: Normal appearance. She is well-developed. She is obese.  HENT:     Head: Normocephalic and atraumatic.  Eyes:     Conjunctiva/sclera: Conjunctivae normal.     Pupils: Pupils are equal, round, and reactive to light.  Neck:     Thyroid: No thyromegaly.  Cardiovascular:     Rate and Rhythm: Normal rate and regular rhythm.     Heart sounds: Normal heart sounds. No murmur heard. Pulmonary:     Effort: Pulmonary effort is normal. No respiratory distress.     Breath sounds: Normal breath sounds.  Abdominal:     General: There is no distension.     Palpations: Abdomen is soft.     Tenderness: There is no abdominal tenderness.  Musculoskeletal:     Cervical back: Normal range of motion and neck supple.     Right lower leg: No edema.     Left lower leg: No edema.  Lymphadenopathy:     Cervical: No cervical adenopathy.  Skin:    General: Skin is warm and dry.  Neurological:      General: No focal deficit present.     Mental Status: She is alert and oriented to person, place, and time.  Psychiatric:        Mood and Affect: Mood normal.        Behavior: Behavior normal.        Thought Content: Thought content normal.          Assessment & Plan:

## 2021-07-29 NOTE — Patient Instructions (Signed)
Follow up in 6 months to recheck BP and weight loss We'll notify you of your lab results and make any changes if needed Continue to work on healthy diet and regular exercise- you can do it! If you change your mind about the mood medication, let me know Call with any questions or concerns Stay Safe!  Stay Healthy! Hang in there!!!

## 2021-07-29 NOTE — Assessment & Plan Note (Signed)
Chronic problem.  Well controlled on Lisinopril 20mg  daily.  Currently asymptomatic.  Check labs due to ACE.  No anticipated med changes.

## 2021-07-30 ENCOUNTER — Ambulatory Visit: Payer: Medicare Other | Attending: Internal Medicine

## 2021-07-30 DIAGNOSIS — Z23 Encounter for immunization: Secondary | ICD-10-CM

## 2021-07-30 NOTE — Progress Notes (Signed)
   Covid-19 Vaccination Clinic  Name:  Virginia Matthews    MRN: [id number] DOB: 1956/06/04  07/30/2021  [name] was observed post Covid-19 immunization for 15 minutes without incident. She was provided with Vaccine Information Sheet and instruction to access the V-Safe system.   [name] was instructed to call 911 with any severe reactions post vaccine: Difficulty breathing  Swelling of face and throat  A fast heartbeat  A bad rash all over body  Dizziness and weakness   Immunizations Administered     Name Date Dose VIS Date Route   Pfizer Covid-19 Vaccine Bivalent Booster 07/30/2021 10:24 AM 0.3 mL 05/28/2021 Intramuscular   Manufacturer: [place]   Lot: [id number]   Elsie: Heath, PharmD, MBA Clinical Acute Care Pharmacist

## 2021-08-07 DIAGNOSIS — D225 Melanocytic nevi of trunk: Secondary | ICD-10-CM | POA: Diagnosis not present

## 2021-08-07 DIAGNOSIS — Z85828 Personal history of other malignant neoplasm of skin: Secondary | ICD-10-CM | POA: Diagnosis not present

## 2021-08-07 DIAGNOSIS — D2262 Melanocytic nevi of left upper limb, including shoulder: Secondary | ICD-10-CM | POA: Diagnosis not present

## 2021-08-07 DIAGNOSIS — L404 Guttate psoriasis: Secondary | ICD-10-CM | POA: Diagnosis not present

## 2021-08-07 DIAGNOSIS — L814 Other melanin hyperpigmentation: Secondary | ICD-10-CM | POA: Diagnosis not present

## 2021-08-07 DIAGNOSIS — D2261 Melanocytic nevi of right upper limb, including shoulder: Secondary | ICD-10-CM | POA: Diagnosis not present

## 2021-08-07 DIAGNOSIS — D2239 Melanocytic nevi of other parts of face: Secondary | ICD-10-CM | POA: Diagnosis not present

## 2021-08-07 DIAGNOSIS — L821 Other seborrheic keratosis: Secondary | ICD-10-CM | POA: Diagnosis not present

## 2021-08-07 DIAGNOSIS — D2271 Melanocytic nevi of right lower limb, including hip: Secondary | ICD-10-CM | POA: Diagnosis not present

## 2021-08-11 ENCOUNTER — Other Ambulatory Visit: Payer: Self-pay | Admitting: Family Medicine

## 2021-08-11 ENCOUNTER — Other Ambulatory Visit: Payer: Self-pay | Admitting: Cardiovascular Disease

## 2021-08-12 ENCOUNTER — Other Ambulatory Visit (HOSPITAL_COMMUNITY): Payer: Self-pay

## 2021-08-12 MED ORDER — ESCITALOPRAM OXALATE 10 MG PO TABS
ORAL_TABLET | Freq: Every day | ORAL | 1 refills | Status: DC
  Filled 2021-08-12: qty 90, 90d supply, fill #0

## 2021-08-12 MED ORDER — LISINOPRIL 20 MG PO TABS
20.0000 mg | ORAL_TABLET | Freq: Every day | ORAL | 0 refills | Status: DC
  Filled 2021-08-12: qty 30, 30d supply, fill #0

## 2021-08-12 NOTE — Telephone Encounter (Signed)
UTD on visits, requesting 90 days

## 2021-08-12 NOTE — Telephone Encounter (Signed)
Rx request sent to pharmacy.  

## 2021-08-14 ENCOUNTER — Other Ambulatory Visit (HOSPITAL_COMMUNITY): Payer: Self-pay

## 2021-08-18 ENCOUNTER — Other Ambulatory Visit (HOSPITAL_COMMUNITY): Payer: Self-pay

## 2021-08-18 ENCOUNTER — Other Ambulatory Visit: Payer: Self-pay

## 2021-08-18 ENCOUNTER — Ambulatory Visit
Admission: EM | Admit: 2021-08-18 | Discharge: 2021-08-18 | Disposition: A | Discharge status: Home / Self Care | Payer: Medicare Other | Attending: Emergency Medicine | Admitting: Emergency Medicine

## 2021-08-18 ENCOUNTER — Ambulatory Visit (HOSPITAL_BASED_OUTPATIENT_CLINIC_OR_DEPARTMENT_OTHER)
Admission: RE | Admit: 2021-08-18 | Discharge: 2021-08-18 | Disposition: A | Discharge status: Home / Self Care | Payer: Medicare Other | Source: Ambulatory Visit | Attending: Emergency Medicine | Admitting: Emergency Medicine

## 2021-08-18 DIAGNOSIS — R0689 Other abnormalities of breathing: Secondary | ICD-10-CM | POA: Diagnosis not present

## 2021-08-18 DIAGNOSIS — S2242XA Multiple fractures of ribs, left side, initial encounter for closed fracture: Secondary | ICD-10-CM

## 2021-08-18 DIAGNOSIS — R0781 Pleurodynia: Secondary | ICD-10-CM | POA: Diagnosis not present

## 2021-08-18 DIAGNOSIS — R069 Unspecified abnormalities of breathing: Secondary | ICD-10-CM | POA: Diagnosis not present

## 2021-08-18 DIAGNOSIS — S299XXA Unspecified injury of thorax, initial encounter: Secondary | ICD-10-CM

## 2021-08-18 DIAGNOSIS — W19XXXA Unspecified fall, initial encounter: Secondary | ICD-10-CM | POA: Insufficient documentation

## 2021-08-18 MED ORDER — IBUPROFEN 800 MG PO TABS
800.0000 mg | ORAL_TABLET | Freq: Three times a day (TID) | ORAL | 0 refills | Status: AC | PRN
Start: 2021-08-18 — End: 2021-08-29
  Filled 2021-08-18: qty 30, 10d supply, fill #0

## 2021-08-18 NOTE — ED Triage Notes (Addendum)
Pt states she had a fall last week, she reports feeling "clicking" in her chest. pt staes she fell on her left side. She states she has been having some SOB with pain. Pt displays no s/s of respiratory distress.  Started: week ago

## 2021-08-18 NOTE — ED Provider Notes (Signed)
[facility]    CSN: [id number] Arrival date & time: 08/18/21  1236   History   Chief Complaint No chief complaint on file.  HPI Virginia Matthews is a 65 y.o. female. Pt states she had a fall last week, she reports feeling "clicking" in her chest. pt staes she fell on her left side against an outdoor wall where there was a spigot sticking out, this resulted in significant bruising which is starting to resolve now.  Patient adds that she has been has been having some SOB with pain, states it is painful to take a deep breath.  Patient's O2 sats are mildly diminished at 95%.  The history is provided by the patient.   Past Medical History:  Diagnosis Date   Breast mass, left 06/2016   Carpal tunnel syndrome on both sides    GERD (gastroesophageal reflux disease)    Heart murmur    Hypertension    states under control with med., has been on med. x 3-4 yr.   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 76/19/5093   Left wrist pain 07/05/2015   Bilateral hip pain 07/05/2015   Leg size inequality 07/05/2015   Right facial numbness 12/21/2013   Anxiety and depression 05/13/2012   Physical exam, routine 05/13/2012   GERD (gastroesophageal reflux disease) 05/13/2012   Seasonal allergies    PCO (polycystic ovaries)    Ruptured lumbar disc    Endometriosis    HYPERTENSION, BENIGN  ESSENTIAL 05/01/2010   NONSPEC ELEVATION OF LEVELS OF TRANSAMINASE/LDH 03/14/2009   Severe obesity (BMI >= 40) ([place]) 02/11/2009   RHINITIS 02/11/2009   ABDOMINAL PAIN, UNSPECIFIED SITE 02/11/2009   Past Surgical History:  Procedure Laterality Date   BREAST EXCISIONAL BIOPSY Left    benign   COLONOSCOPY WITH PROPOFOL  09/12/2015   LAPAROSCOPIC ENDOMETRIOSIS FULGURATION     MICRODISCECTOMY LUMBAR Right 07/16/2011   L4-5   PELVIC LAPAROSCOPY     DIAG LAP   RADIOACTIVE SEED GUIDED EXCISIONAL BREAST BIOPSY Left 07/16/2016   Procedure: LEFT RADIOACTIVE SEED GUIDED EXCISIONAL BREAST BIOPSY;  Surgeon: Rolm Bookbinder, MD;  Location: [facility];  Service: General;  Laterality: Left;   OB History     Gravida  3   Para  1   Term      Preterm      AB  1   Living  1      SAB      IAB      Ectopic      Multiple      Live Births           Obstetric Comments  1. Stillbirth         Home Medications    Prior to Admission medications   Medication Sig Start Date End Date Taking? Authorizing Provider  ibuprofen (ADVIL) 800 MG tablet Take 1 tablet (800  mg total) by mouth every 8 (eight) hours as needed for up to 10 days for moderate pain. 08/18/21 08/28/21 Yes Lynden Oxford Scales, PA-C  b complex vitamins tablet Take 1 tablet by mouth daily.    [provider]  calcipotriene-betamethasone (TACLONEX) ointment Apply topically daily.    [provider]  escitalopram (LEXAPRO) 10 MG tablet TAKE 1 TABLET BY MOUTH ONCE DAILY 08/12/21 08/12/22  Midge Minium, MD  fluticasone (FLONASE) 50 MCG/ACT nasal spray Place 2 sprays into both nostrils daily. 09/18/15   Midge Minium, MD  hydrocortisone valerate cream (WESTCORT) 0.2 %  09/14/17   [provider]  lisinopril (ZESTRIL) 20 MG tablet Take 1 tablet by mouth daily. Please schedule appointment with Dr. Oval Linsey for 90 day refills. 08/12/21   Skeet Latch, MD   metroNIDAZOLE (METROGEL) 0.75 % gel APPLY ON THE SKIN TWICE A DAY AS DIRECTED 09/18/20 09/18/21  Rolm Bookbinder, MD  Multiple Vitamins-Minerals (CENTRUM SILVER PO) Take 1 tablet by mouth daily. Use as directed    [provider]  naproxen (NAPROSYN) 500 MG tablet TAKE 1 TABLET BY MOUTH 2 TIMES DAILY AS NEEDED 10/07/20 10/07/21  Thurman Coyer, DO  Omega-3 Fatty Acids (FISH OIL PO) Take 1 capsule by mouth daily.    [provider]  pantoprazole (PROTONIX) 40 MG tablet TAKE 1 TABLET BY MOUTH ONCE DAILY 04/11/21 04/11/22  Midge Minium, MD  TURMERIC PO Take 1 capsule by mouth 2 (two) times daily.    [provider]  VITAMIN D, CHOLECALCIFEROL, PO Take 2,000 Units by mouth daily.     [provider]   Family History Family History  Problem Relation Age of Onset   Breast cancer Mother    Cancer Father        prostate cancer, lymphoma   Heart disease Father    Hypertension Father    CAD Father    Cancer Brother        prostate cancer   Asthma Brother    CAD Brother    Hypertension Brother    Social History Social History   Tobacco Use   Smoking status: Never   Smokeless tobacco: Never  Vaping Use   Vaping Use: Never used  Substance Use Topics   Alcohol use: Yes    Alcohol/week: 0.0 standard drinks    Comment: socially   Drug use: No   Allergies   Adhesive [tape]  Review of Systems Review of Systems Pertinent findings noted in history of present illness.   Physical Exam Triage Vital Signs ED Triage Vitals  Enc Vitals Group     BP 07/25/21 0827 (!) 147/82     Pulse Rate 07/25/21 0827 72     Resp 07/25/21 0827 18     Temp 07/25/21 0827 98.3 F (36.8 C)     Temp Source 07/25/21 0827 Oral     SpO2 07/25/21 0827 98 %     Weight --      Height --      Head Circumference --      Peak Flow --      Pain Score 07/25/21 0826 5     Pain Loc --      Pain Edu? --      Excl. in [place]? --    No data found.  Updated Vital Signs BP  137/86 (BP Location: Right Arm)   Pulse 70   Temp 98.7 F (37.1 C) (Oral)   Resp 20   SpO2  95%   Visual Acuity Right Eye Distance:   Left Eye Distance:   Bilateral Distance:    Right Eye Near:   Left Eye Near:    Bilateral Near:     Physical Exam Vitals and nursing note reviewed.  Constitutional:      General: She is not in acute distress.    Appearance: Normal appearance. She is not ill-appearing.  HENT:     Head: Normocephalic and atraumatic.  Eyes:     General: Lids are normal.        Right eye: No discharge.        Left eye: No discharge.     Extraocular Movements: Extraocular movements intact.     Conjunctiva/sclera: Conjunctivae normal.     Right eye: Right conjunctiva is not injected.     Left eye: Left conjunctiva is not injected.  Neck:     Trachea: Trachea and phonation normal.  Cardiovascular:     Rate and Rhythm: Normal rate and regular rhythm.     Pulses: Normal pulses.     Heart sounds: Normal heart sounds. No murmur heard.   No friction rub. No gallop.  Pulmonary:     Effort: Pulmonary effort is normal. No accessory muscle usage, prolonged expiration or respiratory distress.     Breath sounds: No decreased air movement or transmitted upper airway sounds. No decreased breath sounds, wheezing, rhonchi or rales.     Comments: End inspiratory wheeze appreciated at RIGHT upper lobe posteriorly Chest:     Chest wall: No tenderness.  Musculoskeletal:        General: Normal range of motion.     Cervical back: Normal range of motion and neck supple. Normal range of motion.     Comments: Clicking appreciated at left lower sternal border at insertion of fifth rib  Lymphadenopathy:     Cervical: No cervical adenopathy.  Skin:    General: Skin is warm and dry.     Findings: Bruising (Left lateral upper thorax, immediately posterior to mid axillary line) present. No erythema or rash.  Neurological:     General: No focal deficit present.     Mental Status: She  is alert and oriented to person, place, and time.  Psychiatric:        Mood and Affect: Mood normal.        Behavior: Behavior normal.   UC Treatments / Results  Labs (all labs ordered are listed, but only abnormal results are displayed)  Labs Reviewed - No data to display  EKG  Radiology No results found.  Procedures Procedures (including critical care time)  Medications Ordered in UC Medications - No data to display  Initial Impression / Assessment and Plan / UC Course  I have reviewed the triage vital signs and the nursing notes.  Pertinent labs & imaging results that were available during my care of the patient were reviewed by me and considered in my medical decision making (see chart for details).      Given patient's fall, significant bruising, sternal clicking and adventitious breath sounds on the alternate side, I recommended patient have an x-ray performed soon as possible, unfortunately patient had to be sent to one of our outside imaging facilities because we are not able to perform x-rays at this facility.    Per personal read, patient has fractured 2 possibly 3 ribs and lungs appear normal, radiology report is still pending at the time of writing this note.  Patient contacted with results, ibuprofen  sent for pain.  Return precautions advised.   Final Clinical Impressions(s) / UC Diagnoses   Final diagnoses:  Breath sounds, abnormal  Traumatic injury of rib  Closed fracture of multiple ribs of left side, initial encounter     Discharge Instructions      You had abnormal inspiratory breath sounds your right upper lobe on exam today.  I also appreciated the same clicking in your left lower sternal border that you reported.    As I am sure you are aware, there is not a whole lot we can do about a broken rib other than watchful waiting, I really just need to rule out any abnormal lung findings that may have occurred secondary to your fall.  I am reassured that  your vital signs are normal on arrival today.  Please go to the [facility] location now to have x-ray performed of your ribs and chest.  We should receive these results within the next hour or 2, we will call you and let you know what they show.      ED Prescriptions     Medication Sig Dispense Auth. Provider   ibuprofen (ADVIL) 800 MG tablet Take 1 tablet (800 mg total) by mouth every 8 (eight) hours as needed for up to 10 days for moderate pain. 30 tablet Lynden Oxford Scales, PA-C      PDMP not reviewed this encounter.  Disposition Upon Discharge:  Patient presented with an acute illness with associated systemic symptoms and significant discomfort requiring urgent management. In my opinion, this is a condition that a prudent lay person (someone who possesses an average knowledge of health and medicine) may potentially expect to result in complications if not addressed urgently such as respiratory distress, impairment of bodily function or dysfunction of bodily organs.   Routine symptom specific, illness specific and/or disease specific instructions were discussed with the patient and/or caregiver at length.   As such, the patient has been evaluated and assessed, work-up was performed and treatment was provided in alignment with urgent care protocols and evidence based medicine.  Patient/parent/caregiver has been advised that the patient may require follow up for further testing and treatment if the symptoms continue in spite of treatment, as clinically indicated and appropriate.  Patient/parent/caregiver has been advised to return to the [facility] or PCP in 3-5 days if no better; to PCP or the Emergency Department if new signs and symptoms develop, or if the current signs or symptoms continue to change or worsen for further workup, evaluation and treatment as clinically indicated and appropriate  The patient will follow up with their current PCP if and as advised. If the patient  does not currently have a PCP we will assist them in obtaining one.   The patient may need specialty follow up if the symptoms continue, in spite of conservative treatment and management, for further workup, evaluation, consultation and treatment as clinically indicated and appropriate.  Patient/parent/caregiver verbalized understanding and agreement of plan as discussed.  All questions were addressed during visit.  Please see discharge instructions below for further details of plan.  Condition: stable for discharge home Home: take medications as prescribed; routine discharge instructions as discussed; follow up as advised.    Lynden Oxford Scales, PA-C 08/18/21 1651

## 2021-08-18 NOTE — Discharge Instructions (Addendum)
You had abnormal inspiratory breath sounds your right upper lobe on exam today.  I also appreciated the same clicking in your left lower sternal border that you reported.    As I am sure you are aware, there is not a whole lot we can do about a broken rib other than watchful waiting, I really just need to rule out any abnormal lung findings that may have occurred secondary to your fall.  I am reassured that your vital signs are normal on arrival today.  Please go to the [facility] location now to have x-ray performed of your ribs and chest.  We should receive these results within the next hour or 2, we will call you and let you know what they show.

## 2021-08-19 ENCOUNTER — Other Ambulatory Visit (HOSPITAL_BASED_OUTPATIENT_CLINIC_OR_DEPARTMENT_OTHER): Payer: Self-pay

## 2021-08-19 DIAGNOSIS — Z23 Encounter for immunization: Secondary | ICD-10-CM | POA: Diagnosis not present

## 2021-08-19 MED ORDER — PFIZER COVID-19 VAC BIVALENT 30 MCG/0.3ML IM SUSP
INTRAMUSCULAR | 0 refills | Status: DC
  Filled 2021-08-19: qty 0.3, 1d supply, fill #0

## 2021-09-03 ENCOUNTER — Other Ambulatory Visit (HOSPITAL_COMMUNITY): Payer: Self-pay

## 2021-09-03 ENCOUNTER — Ambulatory Visit (INDEPENDENT_AMBULATORY_CARE_PROVIDER_SITE_OTHER): Payer: Medicare Other | Admitting: Family Medicine

## 2021-09-03 ENCOUNTER — Encounter: Payer: Self-pay | Admitting: Family Medicine

## 2021-09-03 VITALS — BP 118/82 | HR 65 | Temp 98.2°F | Resp 17 | Wt 301.6 lb

## 2021-09-03 DIAGNOSIS — R051 Acute cough: Secondary | ICD-10-CM

## 2021-09-03 DIAGNOSIS — S2242XD Multiple fractures of ribs, left side, subsequent encounter for fracture with routine healing: Secondary | ICD-10-CM

## 2021-09-03 MED ORDER — GUAIFENESIN-CODEINE 100-10 MG/5ML PO SYRP
10.0000 mL | ORAL_SOLUTION | Freq: Three times a day (TID) | ORAL | 0 refills | Status: DC | PRN
  Filled 2021-09-03: qty 180, 6d supply, fill #0

## 2021-09-03 MED ORDER — ALBUTEROL SULFATE HFA 108 (90 BASE) MCG/ACT IN AERS
2.0000 | INHALATION_SPRAY | Freq: Four times a day (QID) | RESPIRATORY_TRACT | 0 refills | Status: DC | PRN
  Filled 2021-09-03: qty 8.5, 25d supply, fill #0
  Filled 2021-09-03: qty 18, 25d supply, fill #0

## 2021-09-03 MED ORDER — BENZONATATE 200 MG PO CAPS
200.0000 mg | ORAL_CAPSULE | Freq: Three times a day (TID) | ORAL | 0 refills | Status: DC | PRN
  Filled 2021-09-03: qty 45, 15d supply, fill #0

## 2021-09-03 NOTE — Patient Instructions (Addendum)
Follow up as needed or as scheduled Use the Codeine Cough for cough/pain/sleep Use the Tessalon as needed for daytime cough Use the Albuterol to help w/ cough and wheezing If no improvement or worsening- let me know! Call with any questions or concerns Hang in there!!! Happy Holidays!!!

## 2021-09-03 NOTE — Progress Notes (Signed)
   Subjective:    Patient ID: MONIFA BLANCHETTE, female    DOB: 01/11/1956, 65 y.o.   MRN: [id number]  HPI Broken ribs- pt was seen in ER 11/21 after falling the week before.  She had ongoing pleuritic pain and noted a 'clicking' in her chest.  CXR showed fx of L 5th and 6th ribs.  No PTX.  Pt pain is manageable during the day but she is unable to sleep at night.  Pt has developed a 'rattling' cough w/ wheezing on L side since going to the ER.  + fatigue.  No fever.  Denies SOB.   Review of Systems For ROS see HPI   This visit occurred during the SARS-CoV-2 public health emergency.  Safety protocols were in place, including screening questions prior to the visit, additional usage of staff PPE, and extensive cleaning of exam room while observing appropriate contact time as indicated for disinfecting solutions.      Objective:   Physical Exam Vitals reviewed.  Constitutional:      General: She is not in acute distress.    Appearance: Normal appearance. She is obese. She is not ill-appearing.  HENT:     Head: Normocephalic and atraumatic.  Eyes:     Extraocular Movements: Extraocular movements intact.     Conjunctiva/sclera: Conjunctivae normal.     Pupils: Pupils are equal, round, and reactive to light.  Pulmonary:     Effort: Pulmonary effort is normal. No respiratory distress.     Breath sounds: Wheezing (expiratory wheezes) present. No rhonchi or rales.     Comments: Dry cough Chest:     Chest wall: No tenderness (no TTP over L ribs, bruising resolved).  Skin:    General: Skin is warm and dry.  Neurological:     General: No focal deficit present.     Mental Status: She is alert and oriented to person, place, and time.  Psychiatric:        Mood and Affect: Mood normal.        Behavior: Behavior normal.        Thought Content: Thought content normal.          Assessment & Plan:   L rib fractures- new. Pain is much improved and she only notices it at night when trying to  sleep.  She no longer has TTP, bruising has resolved, and there is no obvious step off or deformity.  Will start codeine cough syrup to improve both pain and cough.  Encouraged her to take deep breaths regularly.  Pt expressed understanding and is in agreement w/ plan.   Cough- new.  Developed since her evaluation at [facility] on 11/21.  No crackles or rhonchi but she does have expiratory wheezes throughout.  Will start inhaler to improve wheezing and use codeine cough syrup for pain, cough, and sleep.  Pt expressed understanding and is in agreement w/ plan.

## 2021-09-11 ENCOUNTER — Other Ambulatory Visit (HOSPITAL_COMMUNITY): Payer: Self-pay

## 2021-09-11 MED ORDER — STELARA 45 MG/0.5ML ~~LOC~~ SOSY
PREFILLED_SYRINGE | SUBCUTANEOUS | 0 refills | Status: DC
Start: 2021-09-11 — End: 2022-01-05
  Filled 2021-09-11 (×3): qty 0.5, 90d supply, fill #0
  Filled 2021-09-12 – 2021-10-02 (×4): qty 0.5, 84d supply, fill #0

## 2021-09-12 ENCOUNTER — Other Ambulatory Visit (HOSPITAL_COMMUNITY): Payer: Self-pay

## 2021-09-12 ENCOUNTER — Encounter (HOSPITAL_BASED_OUTPATIENT_CLINIC_OR_DEPARTMENT_OTHER): Payer: Self-pay | Admitting: Cardiovascular Disease

## 2021-09-12 ENCOUNTER — Other Ambulatory Visit: Payer: Self-pay

## 2021-09-12 ENCOUNTER — Ambulatory Visit (INDEPENDENT_AMBULATORY_CARE_PROVIDER_SITE_OTHER): Payer: Medicare Other | Admitting: Cardiovascular Disease

## 2021-09-12 VITALS — BP 128/88 | HR 73 | Ht 66.0 in | Wt 298.0 lb

## 2021-09-12 DIAGNOSIS — I10 Essential (primary) hypertension: Secondary | ICD-10-CM | POA: Diagnosis not present

## 2021-09-12 DIAGNOSIS — I7121 Aneurysm of the ascending aorta, without rupture: Secondary | ICD-10-CM

## 2021-09-12 NOTE — Patient Instructions (Addendum)
Medication Instructions:  Your physician recommends that you continue on your current medications as directed. Please refer to the Current Medication list given to you today.   *If you need a refill on your cardiac medications before your next appointment, please call your pharmacy*   Lab Work: NONE   Testing/Procedures: Your physician has requested that you have an echocardiogram. Echocardiography is a painless test that uses sound waves to create images of your heart. It provides your doctor with information about the size and shape of your heart and how well your hearts chambers and valves are working. This procedure takes approximately one hour. There are no restrictions for this procedure.  TO BE DONE 08/2022   Follow-Up: At [facility], you and your health needs are our priority.  As part of our continuing mission to provide you with exceptional heart care, we have created designated Provider Care Teams.  These Care Teams include your primary Cardiologist (physician) and Advanced Practice Providers (APPs -  Physician Assistants and Nurse Practitioners) who all work together to provide you with the care you need, when you need it.  We recommend signing up for the patient portal called "MyChart".  Sign up information is provided on this After Visit Summary.  MyChart is used to connect with patients for Virtual Visits (Telemedicine).  Patients are able to view lab/test results, encounter notes, upcoming appointments, etc.  Non-urgent messages can be sent to your provider as well.   To learn more about what you can do with MyChart, go to [email or web address].    Your next appointment:   12 month(s) AFTER ECHO   The format for your next appointment:   In Person  Provider:   Skeet Latch, MD   You have been referred to   Where: [facility] Address: [address] [place] [address] Phone: [phone]  IF YOU DO NOT HEAR FROM THEM IN 2 WEEKS  CALL THE ABOVE TO FOLLOW UP

## 2021-09-12 NOTE — Assessment & Plan Note (Signed)
Stable on CT 01/2021.  Repeat 08/2022.  Continue with BP control and add beta blocker if we need an additional agent.

## 2021-09-12 NOTE — Assessment & Plan Note (Signed)
Overall her BP has been well-controlled.  Goal is less than 130/80.  We did talk about the fact that her diastolic is been a little bit elevated.  If we need an additional agent would add a beta-blocker given her aortic aneurysm.  She preferred to work on diet and exercise rather than add an additional agent.  Continue lisinopril for now.

## 2021-09-12 NOTE — Progress Notes (Signed)
Cardiology Office Note   Date:  09/12/2021   ID:  Virginia Matthews, Virginia Matthews 01/01/1956, MRN [id number]  PCP:  Midge Minium, MD  Cardiologist:   Skeet Latch, MD   No chief complaint on file.   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  [name] initially presented 12/2015 with atypical chest pain that occurred in stressful situations.  She also has a family history of premature CAD.  Therefore she was referred for cardiac CT angiography and coronary calcium scoring.  Her coronary calcium score was 0 and there were no obstructive lesions.  It showed a dilated pulmonary artery suggestive of pulmonary hypertension and her ascending thoracic aortic was 4.2 cm.  She was referred for echocardiography that revealed PASP 37 mmHg and was otherwise unremarkable.   She had a repeat echo 01/2020 that revealed LVEF 60 to 65% with normal diastolic function.  Her aorta was 4.4 cm but she had moderate aortic regurgitation.  She followed up with Almyra Deforest, PA, on 07/2016 for palpitations.  She wore a 24-hour Holter 08/2016 that revealed frequent PVCs and occasional PACs.  She was started on metoprolol.   Metoprolol was stopped because her palpitations were better controlled.  She was started on lisinopril due to poorly controlled blood pressure.  Lately her blood pressures been pretty well-controlled.  She does not check it at home.  She has been under a lot of stress lately.  Her husband was sick and in the hospital for almost a month.  She also fell and broke 2 ribs.  This is starting to heal and she is able to be more active again.  She has not had any lower extremity edema, orthopnea, or PND.  She denies any chest pain other than from her rib fractures.  She notes that she did gain a lot of weight while dealing with the above stressors.  She had a repeat echo  01/2021 that revealed LVEF 60 to 65% with mild LVH.  The ascending aorta was 4.4 cm.  Past Medical History:  Diagnosis Date   Breast mass, left 06/2016   Carpal tunnel syndrome on both sides    GERD (gastroesophageal reflux disease)    Heart murmur    Hypertension    states under control with med., has been on med. x 3-4 yr.   LVH (left ventricular hypertrophy)    moderate, per echo 01/2016   Moderate aortic regurgitation 07/03/2020   Obesity    PONV (postoperative nausea and vomiting)    Psoriatic arthritis ([place])    Rosacea    Thoracic ascending aortic aneurysm     Past Surgical History:  Procedure Laterality Date   BREAST EXCISIONAL BIOPSY Left    benign   COLONOSCOPY WITH PROPOFOL  09/12/2015   LAPAROSCOPIC ENDOMETRIOSIS FULGURATION     MICRODISCECTOMY LUMBAR Right 07/16/2011   L4-5   PELVIC LAPAROSCOPY     DIAG LAP   RADIOACTIVE SEED GUIDED EXCISIONAL BREAST BIOPSY Left 07/16/2016   Procedure: LEFT RADIOACTIVE SEED GUIDED EXCISIONAL BREAST BIOPSY;  Surgeon: Rolm Bookbinder, MD;  Location: [place];  Service: General;  Laterality: Left;     Current Outpatient Medications  Medication Sig Dispense Refill   albuterol (VENTOLIN HFA) 108 (90 Base) MCG/ACT inhaler Inhale 2 puffs into the lungs every 6 hours as needed for wheezing or shortness of breath. 8.5 g  0   b complex vitamins tablet Take 1 tablet by mouth daily.     calcipotriene-betamethasone (TACLONEX) ointment Apply topically daily.     COVID-19 mRNA bivalent vaccine, Pfizer, (PFIZER COVID-19 VAC BIVALENT) injection Inject into the muscle. 0.3 mL 0   escitalopram (LEXAPRO) 10 MG tablet TAKE 1 TABLET BY MOUTH ONCE DAILY 90 tablet 1   fluticasone (FLONASE) 50 MCG/ACT nasal spray Place 2 sprays into both nostrils daily. 16 g 3   hydrocortisone valerate cream (WESTCORT) 0.2 %   3   lisinopril (ZESTRIL) 20 MG tablet Take 1 tablet by mouth daily. Please schedule appointment with Dr. Oval Linsey for 90 day  refills. 30 tablet 0   metroNIDAZOLE (METROGEL) 0.75 % gel APPLY ON THE SKIN TWICE A DAY AS DIRECTED 45 g 2   Multiple Vitamins-Minerals (CENTRUM SILVER PO) Take 1 tablet by mouth daily. Use as directed     naproxen (NAPROSYN) 500 MG tablet TAKE 1 TABLET BY MOUTH 2 TIMES DAILY AS NEEDED 60 tablet 0   Omega-3 Fatty Acids (FISH OIL PO) Take 1 capsule by mouth daily.     pantoprazole (PROTONIX) 40 MG tablet TAKE 1 TABLET BY MOUTH ONCE DAILY 90 tablet 1   TURMERIC PO Take 1 capsule by mouth 2 (two) times daily.     ustekinumab (STELARA) 45 MG/0.5ML SOSY injection Inject 1 syringe under the skin every twelve weeks. 0.5 mL 0   VITAMIN D, CHOLECALCIFEROL, PO Take 2,000 Units by mouth daily.      No current facility-administered medications for this visit.    Allergies:   Adhesive [tape]    Social History:  The patient  reports that she has never smoked. She has never used smokeless tobacco. She reports current alcohol use. She reports that she does not use drugs.   Family History:  The patient's family history includes Asthma in her brother; Breast cancer in her mother; CAD in her brother and father; Cancer in her brother and father; Heart disease in her father; Hypertension in her brother and father.    ROS:  Please see the history of present illness.   Otherwise, review of systems are positive for none.   All other systems are reviewed and negative.    PHYSICAL EXAM: VS:  BP 128/88 (BP Location: Left Arm, Patient Position: Sitting, Cuff Size: Large)    Pulse 73    Ht 5\' 6"  (1.676 m)    Wt 298 lb (135.2 kg)    BMI 48.10 kg/m  , BMI Body mass index is 48.1 kg/m. GENERAL:  Well appearing HEENT:  Pupils equal round and reactive, fundi not visualized, oral mucosa unremarkable NECK:  No jugular venous distention, waveform within normal limits, carotid upstroke brisk and symmetric, no bruits LUNGS:  Clear to auscultation bilaterally HEART:  RRR.  PMI not displaced or sustained,S1 and S2 within  normal limits, no S3, no S4, no clicks, no rubs, no murmurs ABD:  Flat, positive bowel sounds normal in frequency in pitch, no bruits, no rebound, no guarding, no midline pulsatile mass, no hepatomegaly, no splenomegaly EXT:  2 plus pulses throughout, no edema, no cyanosis no clubbing SKIN:  No rashes no nodules NEURO:  Cranial nerves II through XII grossly intact, motor grossly intact throughout PSYCH:  Cognitively intact, oriented to person place and time    EKG:  EKG is ordered today. The ekg ordered 06/2020 demonstrates sinus bradycardia.  Rate 58 bpm. 09/12/2021: Sinus rhythm.  Rate 73 bpm.  Minimal criteria for LVH.  Cardiac CT-A 01/16/16: Aortic Valve:  Trileaflet, normal thickness, no calcifications. Coronary Arteries:  Normal coronary origin.  Right dominance. Left main is a large artery with no plaque. LAD is a large caliber artery that gives rise to 3 small diagonal branches and wraps around the apex. There is no plaque. 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 LCX is a medium caliber vessel that gives rise to two small OM branches, there is no plaque. RCA is a large caliber that gives rise to PDA and PLA. There is no plaque. Other findings: Dilated pulmonary artery measuring 36 x 31 mm suggestive of pulmonary hypertension. Normal pulmonary vein drainage into the left atrium. A large left atrial appendage without evidence of a thrombus. IMPRESSION: 1. Coronary calcium score of 0. This was 0 percentile for age and sex matched control. 2. Normal coronary origin with right dominance. 3. No evidence of CAD. 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 4. Dilated pulmonary artery suggestive of pulmonary hypertension.  IMPRESSION: 1. Evidence of probable air trapping in the visualize lung bases, suggesting small airways disease. 2. Ectasia of the ascending thoracic aorta (4.2 cm in diameter).     Echo 01/2021: 1. Left ventricular  ejection fraction, by estimation, is 60 to 65%. The  left ventricle has normal function. The left ventricle has no regional  wall motion abnormalities. There is mild concentric left ventricular  hypertrophy. Left ventricular diastolic  parameters were normal. Elevated left atrial pressure.   2. Right ventricular systolic function is normal. The right ventricular  size is normal. There is normal pulmonary artery systolic pressure. The  estimated right ventricular systolic pressure is 53.6 mmHg.   3. The mitral valve is normal in structure. Trivial mitral valve  regurgitation.   4. The aortic valve is tricuspid. Aortic valve regurgitation is moderate.   5. Aortic dilatation noted. There is mild dilatation of the ascending  aorta measuring 44 mm.     24 Hour Holter Monitor 09/07/16:   Quality: Fair.  Baseline artifact. Predominant rhythm: sinus rhythm Average heart rate: 66 bpm Max heart rate: 119 bpm Min heart rate: 45 bpm   Frequent PVCs (1.4%) Ventricular trigeminy noted Occasional PACs    Recent Labs: 07/29/2021: ALT 22; BUN 20; Creatinine, Ser 0.86; Hemoglobin 13.3; Platelets 219.0; Potassium 4.6; Sodium 138; TSH 2.68    Lipid Panel    Component Value Date/Time   CHOL 180 07/29/2021 1346   TRIG 161.0 (H) 07/29/2021 1346   HDL 53.50 07/29/2021 1346   CHOLHDL 3 07/29/2021 1346   VLDL 32.2 07/29/2021 1346   LDLCALC 95 07/29/2021 1346      Wt Readings from Last 3 Encounters:  09/12/21 298 lb (135.2 kg)  09/03/21 (!) 301 lb 9.6 oz (136.8 kg)  07/29/21 293 lb 12.8 oz (133.3 kg)      ASSESSMENT AND PLAN:  Ascending aortic aneurysm (HCC) Stable on CT 01/2021.  Repeat 08/2022.  Continue with BP control and add beta blocker if we need an additional agent.  HYPERTENSION, BENIGN ESSENTIAL Overall her BP has been well-controlled.  Goal is less than 130/80.  We did talk about the fact that her diastolic is been a little bit elevated.  If we need an additional agent would  add a beta-blocker given her aortic aneurysm.  She preferred to work on diet and exercise rather than add an additional agent.  Continue lisinopril for now.  Severe obesity (BMI >= 40) (HCC) She reports  significant weight gain while her husband was ill and after her fall with rib fractures.  She is starting to get back to being active again.  We did discuss the healthy weight and wellness clinic.  [place] will be out of [place] for the month of January and would like to consider it in February.  We will start the referral as there is often a wait list.  Also recommended considering medication such as Ozempic.    Current medicines are reviewed at length with the patient today.  The patient does not have concerns regarding medicines.  The following changes have been made:  no change  Labs/ tests ordered today include:   Orders Placed This Encounter  Procedures   Ambulatory referral to Family Practice   EKG 12-Lead   ECHOCARDIOGRAM COMPLETE     Disposition:   FU with Seaton Hofmann C. Oval Linsey, MD, [facility]  in 1 year    Signed, Dawanna Grauberger C. Oval Linsey, MD, [facility]  09/12/2021 8:25 AM    [facility]

## 2021-09-12 NOTE — Assessment & Plan Note (Signed)
She reports significant weight gain while her husband was ill and after her fall with rib fractures.  She is starting to get back to being active again.  We did discuss the healthy weight and wellness clinic.  She will be out of the country for the month of January and would like to consider it in February.  We will start the referral as there is often a wait list.  Also recommended considering medication such as Ozempic.

## 2021-09-15 ENCOUNTER — Other Ambulatory Visit (HOSPITAL_COMMUNITY): Payer: Self-pay

## 2021-09-15 ENCOUNTER — Other Ambulatory Visit: Payer: Self-pay | Admitting: Cardiovascular Disease

## 2021-09-15 MED ORDER — LISINOPRIL 20 MG PO TABS
20.0000 mg | ORAL_TABLET | Freq: Every day | ORAL | 3 refills | Status: DC
  Filled 2021-09-15: qty 90, 90d supply, fill #0

## 2021-09-15 NOTE — Telephone Encounter (Signed)
Rx request sent to pharmacy.  

## 2021-09-16 ENCOUNTER — Other Ambulatory Visit (HOSPITAL_COMMUNITY): Payer: Self-pay

## 2021-09-17 ENCOUNTER — Other Ambulatory Visit (HOSPITAL_COMMUNITY): Payer: Self-pay

## 2021-09-18 ENCOUNTER — Other Ambulatory Visit (HOSPITAL_COMMUNITY): Payer: Self-pay

## 2021-09-23 ENCOUNTER — Other Ambulatory Visit (HOSPITAL_COMMUNITY): Payer: Self-pay

## 2021-09-26 ENCOUNTER — Other Ambulatory Visit (HOSPITAL_COMMUNITY): Payer: Self-pay

## 2021-09-30 ENCOUNTER — Other Ambulatory Visit (HOSPITAL_COMMUNITY): Payer: Self-pay

## 2021-09-30 ENCOUNTER — Telehealth: Payer: Self-pay | Admitting: Pharmacy Technician

## 2021-09-30 NOTE — Telephone Encounter (Addendum)
Error

## 2021-10-01 ENCOUNTER — Telehealth (INDEPENDENT_AMBULATORY_CARE_PROVIDER_SITE_OTHER): Payer: Medicare Other | Admitting: Family Medicine

## 2021-10-01 ENCOUNTER — Other Ambulatory Visit (HOSPITAL_COMMUNITY): Payer: Self-pay

## 2021-10-01 ENCOUNTER — Encounter: Payer: Self-pay | Admitting: Family Medicine

## 2021-10-01 DIAGNOSIS — R052 Subacute cough: Secondary | ICD-10-CM | POA: Diagnosis not present

## 2021-10-01 DIAGNOSIS — U071 COVID-19: Secondary | ICD-10-CM | POA: Diagnosis not present

## 2021-10-01 NOTE — Progress Notes (Signed)
Virtual Visit via Video   I connected with patient on 10/01/21 at  1:00 PM EST by a video enabled telemedicine application and verified that I am speaking with the correct person using two identifiers.  Location patient: Home Location provider: [place], Office Persons participating in the virtual visit: Patient, Provider, [employer] Billings C)  I discussed the limitations of evaluation and management by telemedicine and the availability of in person appointments. The patient expressed understanding and agreed to proceed.  Subjective:   HPI:   Cough- pt reports cough has persisted over the last month.  Cough has improved the last 2 days.  Still has mild wheeze- particularly at night.  Cough is also worse at night.  Denies SOB.  Up and functioning w/o difficulty.  Is about to leave for the [place] for the next month.    ROS:   See pertinent positives and negatives per HPI.  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 85/27/7824   Left wrist pain 07/05/2015   Bilateral hip pain 07/05/2015   Leg size inequality 07/05/2015   Right facial numbness 12/21/2013   Anxiety and depression 05/13/2012   Physical exam, routine 05/13/2012   GERD (gastroesophageal reflux disease) 05/13/2012   Seasonal allergies    PCO (polycystic ovaries)    Ruptured lumbar disc    Endometriosis    HYPERTENSION, BENIGN ESSENTIAL 05/01/2010   NONSPEC ELEVATION OF LEVELS OF TRANSAMINASE/LDH 03/14/2009   Severe obesity (BMI >= 40) ([place]) 02/11/2009   RHINITIS 02/11/2009   ABDOMINAL PAIN, UNSPECIFIED SITE 02/11/2009    Social History   Tobacco Use    Smoking status: Never   Smokeless tobacco: Never  Substance Use Topics   Alcohol use: Yes    Alcohol/week: 0.0 standard drinks    Comment: socially    Current Outpatient Medications:    albuterol (VENTOLIN HFA) 108 (90 Base) MCG/ACT inhaler, Inhale 2 puffs into the lungs every 6 hours as needed for wheezing or shortness of breath., Disp: 8.5 g, Rfl: 0   b complex vitamins tablet, Take 1 tablet by mouth daily., Disp: , Rfl:    calcipotriene-betamethasone (TACLONEX) ointment, Apply topically daily., Disp: , Rfl:    COVID-19 mRNA bivalent vaccine, Pfizer, (PFIZER COVID-19 VAC BIVALENT) injection, Inject into the muscle., Disp: 0.3 mL, Rfl: 0   escitalopram (LEXAPRO) 10 MG tablet, TAKE 1 TABLET BY MOUTH ONCE DAILY, Disp: 90 tablet, Rfl: 1   fluticasone (FLONASE) 50 MCG/ACT nasal spray, Place 2 sprays into both nostrils daily., Disp: 16 g, Rfl: 3   hydrocortisone valerate cream (WESTCORT) 0.2 %, , Disp: , Rfl: 3   lisinopril (ZESTRIL) 20 MG tablet, Take 1 tablet (20 mg total) by mouth daily., Disp: 90 tablet, Rfl: 3   Multiple Vitamins-Minerals (CENTRUM SILVER PO), Take 1 tablet by mouth daily. Use as directed, Disp: , Rfl:    naproxen (NAPROSYN) 500 MG tablet, TAKE 1 TABLET BY MOUTH 2 TIMES DAILY AS NEEDED, Disp: 60 tablet, Rfl: 0   Omega-3 Fatty Acids (FISH OIL PO), Take 1 capsule by mouth daily., Disp: , Rfl:    pantoprazole (PROTONIX) 40 MG tablet, TAKE 1 TABLET BY MOUTH ONCE DAILY, Disp: 90 tablet,  Rfl: 1   TURMERIC PO, Take 1 capsule by mouth 2 (two) times daily., Disp: , Rfl:    ustekinumab (STELARA) 45 MG/0.5ML SOSY injection, Inject 1 syringe under the skin every twelve weeks., Disp: 0.5 mL, Rfl: 0   VITAMIN D, CHOLECALCIFEROL, PO, Take 2,000 Units by mouth daily. , Disp: , Rfl:   Allergies  Allergen Reactions   Adhesive [Tape] Other (See Comments)    SKIN IRRITATION    Objective:   There were no vitals taken for this visit. AAOx3, NAD NCAT, EOMI No obvious CN  deficits Coloring WNL Pt is able to speak clearly, coherently without shortness of breath or increased work of breathing.  Thought process is linear.  Mood is appropriate.   Assessment and Plan:   Cough- new.  Suspect this is post viral cough as she doesn't feel sick and is up and functioning w/o difficulty.  Pt reports cough has improved over the last 2 days.  Has Albuterol inhaler to use prn.  No abx needed at this time.  Reviewed supportive care and red flags that should prompt return.  Pt expressed understanding and is in agreement w/ plan.    Annye Asa, MD 10/01/2021

## 2021-10-01 NOTE — Telephone Encounter (Addendum)
Error

## 2021-10-02 ENCOUNTER — Other Ambulatory Visit (HOSPITAL_COMMUNITY): Payer: Self-pay

## 2021-11-05 DIAGNOSIS — L4050 Arthropathic psoriasis, unspecified: Secondary | ICD-10-CM | POA: Diagnosis not present

## 2021-11-05 DIAGNOSIS — M170 Bilateral primary osteoarthritis of knee: Secondary | ICD-10-CM | POA: Diagnosis not present

## 2021-11-05 DIAGNOSIS — L409 Psoriasis, unspecified: Secondary | ICD-10-CM | POA: Diagnosis not present

## 2021-11-05 DIAGNOSIS — R768 Other specified abnormal immunological findings in serum: Secondary | ICD-10-CM | POA: Diagnosis not present

## 2021-11-17 ENCOUNTER — Other Ambulatory Visit: Payer: Self-pay

## 2021-11-17 ENCOUNTER — Encounter: Payer: Self-pay | Admitting: Family Medicine

## 2021-11-17 MED ORDER — PANTOPRAZOLE SODIUM 40 MG PO TBEC: DELAYED_RELEASE_TABLET | Freq: Every day | ORAL | 1 refills | Status: DC

## 2021-11-17 MED ORDER — ESCITALOPRAM OXALATE 10 MG PO TABS: ORAL_TABLET | Freq: Every day | ORAL | 1 refills | Status: DC

## 2021-11-19 DIAGNOSIS — H52223 Regular astigmatism, bilateral: Secondary | ICD-10-CM | POA: Diagnosis not present

## 2021-11-19 DIAGNOSIS — H524 Presbyopia: Secondary | ICD-10-CM | POA: Diagnosis not present

## 2021-11-19 DIAGNOSIS — H5213 Myopia, bilateral: Secondary | ICD-10-CM | POA: Diagnosis not present

## 2021-11-19 DIAGNOSIS — H35033 Hypertensive retinopathy, bilateral: Secondary | ICD-10-CM | POA: Diagnosis not present

## 2021-11-25 DIAGNOSIS — L4050 Arthropathic psoriasis, unspecified: Secondary | ICD-10-CM | POA: Diagnosis not present

## 2021-12-11 DIAGNOSIS — L4050 Arthropathic psoriasis, unspecified: Secondary | ICD-10-CM | POA: Diagnosis not present

## 2021-12-14 ENCOUNTER — Encounter (HOSPITAL_BASED_OUTPATIENT_CLINIC_OR_DEPARTMENT_OTHER): Payer: Self-pay | Admitting: Cardiovascular Disease

## 2021-12-15 ENCOUNTER — Other Ambulatory Visit (HOSPITAL_BASED_OUTPATIENT_CLINIC_OR_DEPARTMENT_OTHER): Payer: Self-pay

## 2021-12-15 MED ORDER — LISINOPRIL 20 MG PO TABS: 20.0000 mg | ORAL_TABLET | Freq: Every day | ORAL | 3 refills | Status: DC

## 2021-12-15 NOTE — Telephone Encounter (Signed)
Refill sent to new pharmacy per patient request  ?

## 2022-01-05 ENCOUNTER — Other Ambulatory Visit: Payer: Self-pay

## 2022-01-05 ENCOUNTER — Encounter: Payer: Self-pay | Admitting: Family Medicine

## 2022-01-05 ENCOUNTER — Telehealth (INDEPENDENT_AMBULATORY_CARE_PROVIDER_SITE_OTHER): Payer: Medicare Other | Admitting: Family

## 2022-01-05 ENCOUNTER — Encounter: Payer: Self-pay | Admitting: Family

## 2022-01-05 VITALS — BP 126/71 | HR 70 | Temp 99.1°F | Ht 66.0 in | Wt 285.0 lb

## 2022-01-05 DIAGNOSIS — J4520 Mild intermittent asthma, uncomplicated: Secondary | ICD-10-CM | POA: Diagnosis not present

## 2022-01-05 MED ORDER — ALBUTEROL SULFATE HFA 108 (90 BASE) MCG/ACT IN AERS: 2.0000 | INHALATION_SPRAY | Freq: Four times a day (QID) | RESPIRATORY_TRACT | 0 refills | Status: DC | PRN

## 2022-01-05 MED ORDER — PREDNISONE 20 MG PO TABS
ORAL_TABLET | ORAL | 0 refills | Status: DC
Start: 2022-01-05 — End: 2022-01-30

## 2022-01-05 NOTE — Progress Notes (Signed)
? ? ?MyChart Video Visit ? ? ? ?Virtual Visit via Video Note  ? ?This visit type was conducted due to national recommendations for restrictions regarding the COVID-19 Pandemic (e.g. social distancing) in an effort to limit this patient's exposure and mitigate transmission in our community. This patient is at least at moderate risk for complications without adequate follow up. This format is felt to be most appropriate for this patient at this time. Physical exam was limited by quality of the video and audio technology used for the visit. CMA was able to get the patient set up on a video visit. ? ?Patient location: Home. Patient and provider in visit ?Provider location: Office ? ?I discussed the limitations of evaluation and management by telemedicine and the availability of in person appointments. The patient expressed understanding and agreed to proceed. ? ?Visit Date: 01/05/2022 ? ?Today's healthcare provider: Jeanie Sewer, NP  ? ? ? ?Subjective:  ? ? Patient ID: Virginia Matthews, female    DOB: 05/09/1956, 66 y.o.   MRN: [id number] ? ?Chief Complaint  ?Patient presents with  ? Cough  ?  Pt c/o wheezing  ? Headache  ?  Pt c/o coughing, headache and present for a while but has been getting worse since thursday.   ? ?HPI ?Cough: Patient complains of nasal congestion, nonproductive cough, rhinorrhea clear, and wheezing.  Symptoms began 1 week ago.  The cough is non-productive and is aggravated by infection and pollens Associated symptoms include:wheezing. Patient does not have new pets. Patient does have a history of asthma. Patient does have a history of environmental allergens. Patient has not had recent travel. Patient does have a history of smoking, but not recently. Reports using her Albuterol inhaler 3x/day along with Mucinex. ? ? ?Past Medical History:  ?Diagnosis Date  ? Breast mass, left 06/2016  ? Carpal tunnel syndrome on both sides   ? GERD (gastroesophageal reflux disease)   ? Heart murmur   ?  Hypertension   ? states under control with med., has been on med. x 3-4 yr.  ? LVH (left ventricular hypertrophy)   ? moderate, per echo 01/2016  ? Moderate aortic regurgitation 07/03/2020  ? Obesity   ? PONV (postoperative nausea and vomiting)   ? Psoriatic arthritis ([place])   ? Rosacea   ? Thoracic ascending aortic aneurysm ([place])   ? ? ?Past Surgical History:  ?Procedure Laterality Date  ? BREAST EXCISIONAL BIOPSY Left   ? benign  ? COLONOSCOPY WITH PROPOFOL  09/12/2015  ? LAPAROSCOPIC ENDOMETRIOSIS FULGURATION    ? MICRODISCECTOMY LUMBAR Right 07/16/2011  ? L4-5  ? PELVIC LAPAROSCOPY    ? DIAG LAP  ? RADIOACTIVE SEED GUIDED EXCISIONAL BREAST BIOPSY Left 07/16/2016  ? Procedure: LEFT RADIOACTIVE SEED GUIDED EXCISIONAL BREAST BIOPSY;  Surgeon: Rolm Bookbinder, MD;  Location: [place];  Service: General;  Laterality: Left;  ? ? ?Outpatient Medications Prior to Visit  ?Medication Sig Dispense Refill  ? albuterol (VENTOLIN HFA) 108 (90 Base) MCG/ACT inhaler Inhale 2 puffs into the lungs every 6 hours as needed for wheezing or shortness of breath. 8.5 g 0  ? b complex vitamins tablet Take 1 tablet by mouth daily.    ? calcipotriene-betamethasone (TACLONEX) ointment Apply topically daily.    ? COVID-19 mRNA bivalent vaccine, Pfizer, (PFIZER COVID-19 VAC BIVALENT) injection Inject into the muscle. 0.3 mL 0  ? escitalopram (LEXAPRO) 10 MG tablet TAKE 1 TABLET BY MOUTH ONCE DAILY 90 tablet 1  ? fluticasone (FLONASE) 50  MCG/ACT nasal spray Place 2 sprays into both nostrils daily. 16 g 3  ? hydrocortisone valerate cream (WESTCORT) 0.2 %   3  ? inFLIXimab-axxq (AVSOLA) 100 MG injection Inject into the vein.    ? lisinopril (ZESTRIL) 20 MG tablet Take 1 tablet (20 mg total) by mouth daily. 90 tablet 3  ? Multiple Vitamins-Minerals (CENTRUM SILVER PO) Take 1 tablet by mouth daily. Use as directed    ? Omega-3 Fatty Acids (FISH OIL PO) Take 1 capsule by mouth daily.    ? pantoprazole (PROTONIX) 40 MG tablet TAKE 1  TABLET BY MOUTH ONCE DAILY 90 tablet 1  ? TURMERIC PO Take 1 capsule by mouth 2 (two) times daily.    ? VITAMIN D, CHOLECALCIFEROL, PO Take 2,000 Units by mouth daily.     ? ustekinumab (STELARA) 45 MG/0.5ML SOSY injection Inject 1 syringe under the skin every twelve weeks. 0.5 mL 0  ? ?No facility-administered medications prior to visit.  ? ? ?Allergies  ?Allergen Reactions  ? Adhesive [Tape] Other (See Comments)  ?  SKIN IRRITATION  ? ? ?   ?Objective:  ?  ? ?Physical Exam ?Vitals and nursing note reviewed.  ?Constitutional:   ?   General: She is not in acute distress. ?   Appearance: Normal appearance.  ?HENT:  ?   Head: Normocephalic.  ?Pulmonary:  ?   Effort: No respiratory distress.  ?Musculoskeletal:  ?   Cervical back: Normal range of motion.  ?Skin: ?   General: Skin is dry.  ?   Coloration: Skin is not pale.  ?Neurological:  ?   Mental Status: She is alert and oriented to person, place, and time.  ?Psychiatric:     ?   Mood and Affect: Mood normal.  ? ?BP 126/71 (BP Location: Left Arm, Patient Position: Sitting, Cuff Size: Large)   Pulse 70   Temp 99.1 ?F (37.3 ?C) (Temporal)   Ht '5\' 6"'$  (1.676 m)   Wt 285 lb (129.3 kg)   SpO2 97%   BMI 46.00 kg/m?  ? ?Wt Readings from Last 3 Encounters:  ?01/05/22 285 lb (129.3 kg)  ?09/12/21 298 lb (135.2 kg)  ?09/03/21 (!) 301 lb 9.6 oz (136.8 kg)  ? ?   ?Assessment & Plan:  ? ?Problem List Items Addressed This Visit   ?None ?Visit Diagnoses   ? ? Mild intermittent extrinsic asthma without complication    -  Primary  ? using albuterol inhaler, having sinus sx as well, covid test negative 2 days ago. Advised continued use of inhaler, increase Flonase to bid through allergy season and sending low dose prednisone, advised on use & SE. If sx persist, may steroid steroid inhaler added. ? ?Relevant Medications  ? predniSONE (DELTASONE) 20 MG tablet  ? ?  ? ?Meds ordered this encounter  ?Medications  ? predniSONE (DELTASONE) 20 MG tablet  ?  Sig: Take 2 pills in the  morning with breakfast for 3 days, then 1 pill for 2 days  ?  Dispense:  8 tablet  ?  Refill:  0  ?  Order Specific Question:   Supervising Provider  ?  Answer:   ANDY, CAMILLE L [[id number]]  ? ? ?I discussed the assessment and treatment plan with the patient. The patient was provided an opportunity to ask questions and all were answered. The patient agreed with the plan and demonstrated an understanding of the instructions. ?  ?The patient was advised to call back or seek an in-person evaluation  if the symptoms worsen or if the condition fails to improve as anticipated. ? ?I provided 23 minutes of face-to-face time during this encounter. ? ? ?Jeanie Sewer, NP ?[place] ?[phone] (phone) ?[phone] (fax) ? ?[facility]  ?

## 2022-01-08 ENCOUNTER — Ambulatory Visit: Payer: Medicare Other | Admitting: Sports Medicine

## 2022-01-22 DIAGNOSIS — L4050 Arthropathic psoriasis, unspecified: Secondary | ICD-10-CM | POA: Diagnosis not present

## 2022-01-30 ENCOUNTER — Ambulatory Visit (INDEPENDENT_AMBULATORY_CARE_PROVIDER_SITE_OTHER): Payer: Medicare Other | Admitting: Family Medicine

## 2022-01-30 ENCOUNTER — Ambulatory Visit (HOSPITAL_BASED_OUTPATIENT_CLINIC_OR_DEPARTMENT_OTHER)
Admission: RE | Admit: 2022-01-30 | Discharge: 2022-01-30 | Disposition: A | Discharge status: Home / Self Care | Payer: Medicare Other | Source: Ambulatory Visit | Attending: Family Medicine | Admitting: Family Medicine

## 2022-01-30 ENCOUNTER — Encounter: Payer: Self-pay | Admitting: Family Medicine

## 2022-01-30 VITALS — BP 130/84 | HR 73 | Temp 97.7°F | Resp 16 | Ht 66.0 in | Wt 297.6 lb

## 2022-01-30 DIAGNOSIS — U071 COVID-19: Secondary | ICD-10-CM | POA: Diagnosis not present

## 2022-01-30 DIAGNOSIS — R5383 Other fatigue: Secondary | ICD-10-CM

## 2022-01-30 DIAGNOSIS — R052 Subacute cough: Secondary | ICD-10-CM | POA: Diagnosis not present

## 2022-01-30 DIAGNOSIS — M25511 Pain in right shoulder: Secondary | ICD-10-CM

## 2022-01-30 DIAGNOSIS — R059 Cough, unspecified: Secondary | ICD-10-CM | POA: Diagnosis not present

## 2022-01-30 LAB — HEPATIC FUNCTION PANEL
ALT: 28 U/L (ref 0–35)
AST: 25 U/L (ref 0–37)
Albumin: 3.9 g/dL (ref 3.5–5.2)
Alkaline Phosphatase: 79 U/L (ref 39–117)
Bilirubin, Direct: 0.1 mg/dL (ref 0.0–0.3)
Total Bilirubin: 0.4 mg/dL (ref 0.2–1.2)
Total Protein: 6.9 g/dL (ref 6.0–8.3)

## 2022-01-30 LAB — CBC WITH DIFFERENTIAL/PLATELET
Basophils Absolute: 0.1 10*3/uL (ref 0.0–0.1)
Basophils Relative: 1.5 % (ref 0.0–3.0)
Eosinophils Absolute: 0.7 10*3/uL (ref 0.0–0.7)
Eosinophils Relative: 12.3 % — ABNORMAL HIGH (ref 0.0–5.0)
HCT: 39.2 % (ref 36.0–46.0)
Hemoglobin: 13.1 g/dL (ref 12.0–15.0)
Lymphocytes Relative: 22.6 % (ref 12.0–46.0)
Lymphs Abs: 1.3 10*3/uL (ref 0.7–4.0)
MCHC: 33.4 g/dL (ref 30.0–36.0)
MCV: 93.4 fl (ref 78.0–100.0)
Monocytes Absolute: 0.6 10*3/uL (ref 0.1–1.0)
Monocytes Relative: 10.6 % (ref 3.0–12.0)
Neutro Abs: 3.2 10*3/uL (ref 1.4–7.7)
Neutrophils Relative %: 53 % (ref 43.0–77.0)
Platelets: 166 10*3/uL (ref 150.0–400.0)
RBC: 4.2 Mil/uL (ref 3.87–5.11)
RDW: 13.1 % (ref 11.5–15.5)
WBC: 6 10*3/uL (ref 4.0–10.5)

## 2022-01-30 LAB — B12 AND FOLATE PANEL
Folate: >23.5 ng/mL (ref >5.9)
Vitamin B-12: 695 pg/mL (ref 211–911)

## 2022-01-30 LAB — VITAMIN D 25 HYDROXY (VIT D DEFICIENCY, FRACTURES): VITD: 33.32 ng/mL (ref 30.00–100.00)

## 2022-01-30 LAB — BASIC METABOLIC PANEL
BUN: 21 mg/dL (ref 6–23)
CO2: 29 mEq/L (ref 19–32)
Calcium: 9 mg/dL (ref 8.4–10.5)
Chloride: 104 mEq/L (ref 96–112)
Creatinine, Ser: 0.81 mg/dL (ref 0.40–1.20)
GFR: 75.98 mL/min (ref >60.00)
Glucose, Bld: 94 mg/dL (ref 70–99)
Potassium: 4.1 mEq/L (ref 3.5–5.1)
Sodium: 139 mEq/L (ref 135–145)

## 2022-01-30 LAB — TSH: TSH: 2.22 u[IU]/mL (ref 0.35–5.50)

## 2022-01-30 MED ORDER — QVAR REDIHALER 80 MCG/ACT IN AERB: 1.0000 | INHALATION_SPRAY | Freq: Two times a day (BID) | RESPIRATORY_TRACT | 3 refills | Status: DC

## 2022-01-30 MED ORDER — ALBUTEROL SULFATE HFA 108 (90 BASE) MCG/ACT IN AERS: 2.0000 | INHALATION_SPRAY | Freq: Four times a day (QID) | RESPIRATORY_TRACT | 3 refills | Status: AC | PRN

## 2022-01-30 MED ORDER — PREDNISONE 10 MG PO TABS: ORAL_TABLET | ORAL | 0 refills | Status: DC

## 2022-01-30 NOTE — Progress Notes (Signed)
? ?  Subjective:  ? ? Patient ID: Virginia Matthews, female    DOB: Jan 15, 1956, 66 y.o.   MRN: [id number] ? ?HPI ?Cough- pt reports cough was slowly getting better but then she got sick again and was seen on 4/10.  Was prescribed Prednisone w/ some improvement but cough has not resolved.  'it's exhausting'.  + wheezing.  Using albuterol regularly w/ relief.  Cough is intermittently productive of white/clear sputum.  No fevers.  Taking Claritin regularly and using Flonase.  + decreased energy level. ? ?R shoulder pain- pt reports she has had a 'couple weeks' of sxs but this has been worsening.  Pain w/ overhead motion- both forward flexion and abduction.  Great internal rotation.  Difficult to pick up things like milk.  Taking Naprosyn BID. ? ? ?Review of Systems ?For ROS see HPI  ?   ?Objective:  ? Physical Exam ?Vitals reviewed.  ?Constitutional:   ?   General: She is not in acute distress. ?   Appearance: Normal appearance. She is obese. She is not ill-appearing.  ?HENT:  ?   Head: Normocephalic and atraumatic.  ?Eyes:  ?   Extraocular Movements: Extraocular movements intact.  ?   Conjunctiva/sclera: Conjunctivae normal.  ?   Pupils: Pupils are equal, round, and reactive to light.  ?Cardiovascular:  ?   Rate and Rhythm: Normal rate and regular rhythm.  ?   Pulses: Normal pulses.  ?   Heart sounds: Normal heart sounds.  ?Pulmonary:  ?   Effort: Pulmonary effort is normal. No respiratory distress.  ?   Breath sounds: Wheezing (Diffuse end expiratory wheezing) present.  ?Musculoskeletal:  ?   Comments: Limited forward flexion and abduction of R shoulder ?Full internal rotation  ?Skin: ?   General: Skin is warm and dry.  ?Neurological:  ?   General: No focal deficit present.  ?   Mental Status: She is alert and oriented to person, place, and time.  ?Psychiatric:     ?   Mood and Affect: Mood normal.     ?   Behavior: Behavior normal.     ?   Thought Content: Thought content normal.  ? ? ? ? ? ?   ?Assessment & Plan:   ? ?Subacute cough- ongoing issue.  Pt reports cough was getting better until she again got sick in April.  Since then, she has had continued cough w/ expiratory wheezing.  On exam today, pt w/ diffuse end expiratory wheezing that would be consistent w/ RAD.  Start ICS- Qvar 80 1 puff BID.  Get CXR due to duration.  Start oral prednisone taper due to amount of wheezing and frequency of albuterol use.  If no improvement, will need pulmonary referral.  Pt expressed understanding and is in agreement w/ plan.  ? ?Fatigue- new.  Suspect this is multifactorial- not resting at night due to cough, pain from her shoulder, stress at home caring for husband, obesity.  Check labs to r/o metabolic cause.  Treat any abnormalities if present.  Pt expressed understanding and is in agreement w/ plan.  ? ?R shoulder pain- new.  Pt w/ limited range of overhead motion w/ both forward flexion and abduction.  Will refer to Sports Med for complete evaluation.  In the meantime, pt will complete prednisone taper to improve both pain and cough.  Pt expressed understanding and is in agreement w/ plan.  ?

## 2022-01-30 NOTE — Patient Instructions (Addendum)
Go to [facility] and get your chest xray ?We'll notify you of your lab results and make any changes if needed ?START the inhaled Beclamethasone (Qvar) 1 puff twice daily every day ?CONTINUE the albuterol as needed ?START the Prednisone taper as directed- take w/ food ?HOLD on Naprosyn or Ibuprofen or other NSAIDs ?You can take Tylenol as needed for breakthrough pain ?We'll call you with your Sports Med Referral ?Call with any questions or concerns ?Hang in there!!! ?

## 2022-02-02 ENCOUNTER — Telehealth: Payer: Self-pay

## 2022-02-02 ENCOUNTER — Ambulatory Visit: Payer: Medicare Other | Admitting: Family Medicine

## 2022-02-02 NOTE — Telephone Encounter (Signed)
-----   Message from Midge Minium, MD sent at 02/01/2022 11:22 AM EDT ----- ?Labs look great!  No changes at this time ?

## 2022-02-02 NOTE — Telephone Encounter (Signed)
Spoke w/ pt and informed her of the lab results. Pt expressed verbal understanding  ?

## 2022-02-02 NOTE — Telephone Encounter (Signed)
-----   Message from Midge Minium, MD sent at 02/01/2022 11:09 AM EDT ----- ?Chest xray looks good!  I hope you are starting to feel better ?

## 2022-02-02 NOTE — Telephone Encounter (Signed)
Spoke w/ pt informed her of her chest xray results .  ?

## 2022-02-04 ENCOUNTER — Encounter: Payer: Self-pay | Admitting: Family Medicine

## 2022-02-04 ENCOUNTER — Ambulatory Visit: Payer: Self-pay

## 2022-02-04 ENCOUNTER — Ambulatory Visit (INDEPENDENT_AMBULATORY_CARE_PROVIDER_SITE_OTHER): Payer: Medicare Other | Admitting: Family Medicine

## 2022-02-04 VITALS — BP 149/73 | Ht 66.0 in | Wt 289.0 lb

## 2022-02-04 DIAGNOSIS — M25511 Pain in right shoulder: Secondary | ICD-10-CM

## 2022-02-04 NOTE — Progress Notes (Signed)
PCP: Midge Minium, MD ? ?Subjective:  ? ?HPI: ?Patient is a 66 y.o. 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 Patient notes that she's always had mild discomfort in multiple joints but a few weeks ago she heard a click in the right shoulder with sharp pain after swinging her arm to rest it on the couch where she was sitting. She carried on that evening but her right shoulder pain has continued to worsen; pain is better with rest, but worse with lifting, sewing, and at bedtime (painful to lay on her right side). The pain is described as achy, but not tingly or numbing, and it's mainly localized to the superolateral and superoposterior shoulder. She's tried ice and topical creams but these have not helped. Patient notes that she does strength training with a personal couch (had to stop training in April due to illness) and she would like to know when she can back to exercising. Toshua is the main care-taker of her husband who is disabled. Patient is right-handed.  ? ?Past Medical History:  ?Diagnosis Date  ? Breast mass, left 06/2016  ? Carpal tunnel syndrome on both sides   ? GERD (gastroesophageal reflux disease)   ? Heart murmur   ? Hypertension   ? states under control with med., has been on med. x 3-4 yr.  ? LVH (left ventricular hypertrophy)   ? moderate, per echo 01/2016  ? Moderate aortic regurgitation 07/03/2020  ? Obesity   ? PONV (postoperative nausea and vomiting)   ? Psoriatic arthritis ([place])   ? Rosacea   ? Thoracic ascending aortic aneurysm ([place])   ? ? ?Current Outpatient Medications on File Prior to Visit  ?Medication Sig Dispense Refill  ? albuterol (VENTOLIN HFA) 108 (90 Base) MCG/ACT inhaler Inhale 2 puffs into the lungs every 6 hours as needed for wheezing or shortness of breath. 8.5 g 3  ? b complex vitamins tablet Take 1 tablet by mouth daily.    ? beclomethasone (QVAR REDIHALER) 80 MCG/ACT  inhaler Inhale 1 puff into the lungs 2 (two) times daily. 1 each 3  ? calcipotriene-betamethasone (TACLONEX) ointment Apply topically daily.    ? escitalopram (LEXAPRO) 10 MG tablet TAKE 1 TABLET BY MOUTH ONCE DAILY 90 tablet 1  ? fluticasone (FLONASE) 50 MCG/ACT nasal spray Place 2 sprays into both nostrils daily. 16 g 3  ? hydrocortisone valerate cream (WESTCORT) 0.2 %   3  ? inFLIXimab-axxq (AVSOLA) 100 MG injection Inject into the vein.    ? lisinopril (ZESTRIL) 20 MG tablet Take 1 tablet (20 mg total) by mouth daily. 90 tablet 3  ? Multiple Vitamins-Minerals (CENTRUM SILVER PO) Take 1 tablet by mouth daily. Use as directed    ? Omega-3 Fatty Acids (FISH OIL PO) Take 1 capsule by mouth daily.    ? pantoprazole (PROTONIX) 40 MG tablet TAKE 1 TABLET BY MOUTH ONCE DAILY 90 tablet 1  ? predniSONE (DELTASONE) 10 MG tablet 3 tabs x3 days and then 2 tabs x3 days and then 1 tab x3 days.  Take w/ food. 18 tablet 0  ? TURMERIC PO Take 1 capsule by mouth 2 (two) times daily.    ? VITAMIN D, CHOLECALCIFEROL, PO Take 2,000 Units by mouth daily.     ? ?No current facility-administered medications on file prior to visit.  ? ? ?Past Surgical History:  ?Procedure Laterality Date  ? BREAST EXCISIONAL BIOPSY Left   ?  benign  ? COLONOSCOPY WITH PROPOFOL  09/12/2015  ? LAPAROSCOPIC ENDOMETRIOSIS FULGURATION    ? MICRODISCECTOMY LUMBAR Right 07/16/2011  ? L4-5  ? PELVIC LAPAROSCOPY    ? DIAG LAP  ? RADIOACTIVE SEED GUIDED EXCISIONAL BREAST BIOPSY Left 07/16/2016  ? Procedure: LEFT RADIOACTIVE SEED GUIDED EXCISIONAL BREAST BIOPSY;  Surgeon: Rolm Bookbinder, MD;  Location: [facility];  Service: General;  Laterality: Left;  ? ? ?Allergies  ?Allergen Reactions  ? Adhesive [Tape] Other (See Comments)  ?  SKIN IRRITATION  ? ? ?BP (!) 149/73   Ht '5\' 6"'$  (1.676 m)   Wt 289 lb (131.1 kg)   BMI 46.65 kg/m?  ? ? ?  05/28/2020  ?  1:57 PM 07/22/2021  ?  2:56 PM  ?[facility]  ?Frequency of aerobic  exercise (# of days/week) 5 2  ?Average time in minutes 20 20  ?Frequency of strengthening activities (# of days/week) 0 2  ? ? ?   ? View : No data to display.  ?  ?  ?  ? ? ?    ?Objective:  ?Physical Exam: ? ?Gen: NAD, comfortable in exam room. ?Resp: No increased WOB.  ?Psych: Appropriate mood and affect.  ? ?MSK: Right shoulder --  ?No swelling or changes in skin color. No TTP. Full ROM intact, but painful with extreme flexion and extreme abduction. Pain with resisted external rotation, no pain with resisted internal rotation. Positive hawkins, neers.  Negative sulcus. Negative drop arm test. Positive open and empty can test against resistance. Negative O'Brien's. ? ?Neurovascular intact distally.  ? ?Complete MSK u/s right shoulder: ?Biceps tendon: intact with minimal focal tenosynovitis. ?Pec major tendon: intact without abnormalities ?Subscapularis: intact without abnormalities ?AC joint: moderate arthropathy with small geyser sign. ?Infraspinatus: intact without abnormalities ?Supraspinatus: mild overlying subacromial bursitis.  At most anterior portion hypoechoic change interstitially within tendon but no obvious tear. ?Posterior glenohumeral joint: no effusion, paralabral cyst.  Mild glenohumeral arthropathy. ? ?Impression: Subacromial bursitis with supraspinatus strain/tendinopathy.  ? ?Assessment & Plan:  ?1. 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 There's no history of falls or trauma to the shoulder, and patient feels that the pain has continued to get worse, especially with lifting and laying on her side. Her physical exam was markedly positive for supraspinatus involvement (positive empty/open can test against resistance and positive O'Brien's), and ultrasound today was overall reassuring although it did show an  anechoic distended structure at the subacromial bursa.  ? ?Patient has full ROM and appropriate strength, and joint stability does not appear to be compromised; it is possible that her pain is coming from an inflamed subacromial bursa and a strain of the supraspinatus (as suggested by U/S and physical exam).We discussed subacromial bursitis and rotator cuff strain as well as their conservative management.  ? ?In summary, the plan is the following:  ?Continue prednisone and naproxen. ?Can consider diclofenac over naproxen if there's not adequate pain relief. ?Consider steroid injection as an option, but we should re-evaluate at follow-up prior to deciding on injection (steroids could increase risk of rotator cuff tear). ?Can ice 15 minutes at a time 3-4 times a day as needed. ?Home exercises once a day as directed by our athletic trainer. ?Follow up in 1 month. ? ?We can consider formal physical therapy if patient  is not improving.  ? ?Dominica Severin, MS4 ?[facility]  ?

## 2022-02-04 NOTE — Patient Instructions (Signed)
Your ultrasound is reassuring. ?You have subacromial bursitis and a strain of one of the rotator cuff muscles. ?Continue your prednisone and naproxen. ?We can consider diclofenac instead of the naproxen if you're still struggling. ?Steroid injection is an option but I would wait 3 more weeks before considering this as it can increase the risk of rotator cuff tear especially when you've injured the rotator cuff. ?Ice 15 minutes at a time 3-4 times a day as needed. ?Do home exercises as directed only once a day. ?Consider formal physical therapy. ?Follow up with me in 1 month. ?

## 2022-02-10 ENCOUNTER — Ambulatory Visit: Payer: Medicare Other | Admitting: Sports Medicine

## 2022-02-18 ENCOUNTER — Other Ambulatory Visit: Payer: Self-pay | Admitting: Family Medicine

## 2022-02-18 DIAGNOSIS — Z1231 Encounter for screening mammogram for malignant neoplasm of breast: Secondary | ICD-10-CM

## 2022-03-04 ENCOUNTER — Encounter: Payer: Self-pay | Admitting: Family Medicine

## 2022-03-04 ENCOUNTER — Ambulatory Visit: Payer: Medicare Other | Admitting: Family Medicine

## 2022-03-04 VITALS — BP 154/86 | Ht 66.0 in | Wt 290.0 lb

## 2022-03-04 DIAGNOSIS — M25511 Pain in right shoulder: Secondary | ICD-10-CM

## 2022-03-04 NOTE — Progress Notes (Signed)
PCP: Midge Minium, MD  Subjective:   HPI: Patient is a 66 y.o. female here for right shoulder pain.  5/10: Patient is a 66 y.o. 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 Patient notes that she's always had mild discomfort in multiple joints but a few weeks ago she heard a click in the right shoulder with sharp pain after swinging her arm to rest it on the couch where she was sitting. She carried on that evening but her right shoulder pain has continued to worsen; pain is better with rest, but worse with lifting, sewing, and at bedtime (painful to lay on her right side). The pain is described as achy, but not tingly or numbing, and it's mainly localized to the superolateral and superoposterior shoulder. She's tried ice and topical creams but these have not helped. Patient notes that she does strength training with a personal couch (had to stop training in April due to illness) and she would like to know when she can back to exercising. Virginia Matthews is the main care-taker of her husband who is disabled. Patient is right-handed.   6/7: Patient reports she's doing much better compared to last visit, about 70% improved. Doing home exercises but admits she could be doing them more frequently. Taking her naproxen twice a day with food. Has to sleep with her right arm supported but able to sleep. Pain noted with lifting, overhead motions.  Past Medical History:  Diagnosis Date   Breast mass, left 06/2016   Carpal tunnel syndrome on both sides    GERD (gastroesophageal reflux disease)    Heart murmur    Hypertension    states under control with med., has been on med. x 3-4 yr.   LVH (left ventricular hypertrophy)    moderate, per echo 01/2016   Moderate aortic regurgitation 07/03/2020   Obesity    PONV (postoperative nausea and vomiting)    Psoriatic arthritis ([place])    Rosacea    Thoracic  ascending aortic aneurysm ([place])     Current Outpatient Medications on File Prior to Visit  Medication Sig Dispense Refill   albuterol (VENTOLIN HFA) 108 (90 Base) MCG/ACT inhaler Inhale 2 puffs into the lungs every 6 hours as needed for wheezing or shortness of breath. 8.5 g 3   b complex vitamins tablet Take 1 tablet by mouth daily.     beclomethasone (QVAR REDIHALER) 80 MCG/ACT inhaler Inhale 1 puff into the lungs 2 (two) times daily. 1 each 3   calcipotriene-betamethasone (TACLONEX) ointment Apply topically daily.     escitalopram (LEXAPRO) 10 MG tablet TAKE 1 TABLET BY MOUTH ONCE DAILY 90 tablet 1   fluticasone (FLONASE) 50 MCG/ACT nasal spray Place 2 sprays into both nostrils daily. 16 g 3   hydrocortisone valerate cream (WESTCORT) 0.2 %   3   inFLIXimab-axxq (AVSOLA) 100 MG injection Inject into the vein.     lisinopril (ZESTRIL) 20 MG tablet Take 1 tablet (20 mg total) by mouth daily. 90 tablet 3   Multiple Vitamins-Minerals (CENTRUM SILVER PO) Take 1 tablet by mouth daily. Use as directed     Omega-3 Fatty Acids (FISH OIL PO) Take 1 capsule by mouth daily.     pantoprazole (PROTONIX) 40 MG tablet TAKE 1 TABLET BY MOUTH ONCE DAILY 90 tablet 1   predniSONE (DELTASONE) 10 MG tablet 3 tabs x3 days and then 2 tabs x3 days and then 1 tab x3 days.  Take w/ food. 18 tablet 0   TURMERIC PO Take 1 capsule by mouth 2 (two) times daily.     VITAMIN D, CHOLECALCIFEROL, PO Take 2,000 Units by mouth daily.      No current facility-administered medications on file prior to visit.    Past Surgical History:  Procedure Laterality Date   BREAST EXCISIONAL BIOPSY Left    benign   COLONOSCOPY WITH PROPOFOL  09/12/2015   LAPAROSCOPIC ENDOMETRIOSIS FULGURATION     MICRODISCECTOMY LUMBAR Right 07/16/2011   L4-5   PELVIC LAPAROSCOPY     DIAG LAP   RADIOACTIVE SEED GUIDED EXCISIONAL BREAST BIOPSY Left 07/16/2016   Procedure: LEFT RADIOACTIVE SEED GUIDED EXCISIONAL BREAST BIOPSY;  Surgeon: Rolm Bookbinder, MD;  Location: [facility];  Service: General;  Laterality: Left;    Allergies  Allergen Reactions   Adhesive [Tape] Other (See Comments)    SKIN IRRITATION    BP (!) 154/86   Ht '5\' 6"'$  (1.676 m)   Wt 290 lb (131.5 kg)   BMI 46.81 kg/m      05/28/2020    1:57 PM 07/22/2021    2:56 PM  [facility]  Frequency of aerobic exercise (# of days/week) 5 2  Average time in minutes 20 20  Frequency of strengthening activities (# of days/week) 0 2        View : No data to display.              Objective:  Physical Exam:  Gen: NAD, comfortable in exam room  Right shoulder: No swelling, ecchymoses. No gross deformity. No TTP AC joint, biceps tendon. FROM. Negative Hawkins, Neers. Negative Yergasons. Strength 5/5 with empty can and resisted internal/external rotation.  Mild pain empty can. NV intact distally.   Assessment & Plan:  1. Right shoulder pain - 2/2 rotator cuff impingement, subacromial bursitis.  Improved.  Encouraged her to do home exercises more frequently.  Continue naproxen.  Consider subacromial injection, formal physical therapy if she doesn't continue to improve.  F/u prn.

## 2022-03-25 ENCOUNTER — Encounter: Payer: Self-pay | Admitting: Family Medicine

## 2022-03-25 ENCOUNTER — Ambulatory Visit (INDEPENDENT_AMBULATORY_CARE_PROVIDER_SITE_OTHER): Payer: Medicare Other | Admitting: Family Medicine

## 2022-03-25 VITALS — BP 118/62 | HR 79 | Temp 98.4°F | Resp 16 | Ht 66.0 in | Wt 283.4 lb

## 2022-03-25 DIAGNOSIS — G4700 Insomnia, unspecified: Secondary | ICD-10-CM | POA: Diagnosis not present

## 2022-03-25 DIAGNOSIS — M2550 Pain in unspecified joint: Secondary | ICD-10-CM

## 2022-03-25 DIAGNOSIS — R21 Rash and other nonspecific skin eruption: Secondary | ICD-10-CM | POA: Diagnosis not present

## 2022-03-25 LAB — CBC WITH DIFFERENTIAL/PLATELET
Basophils Absolute: 0.1 10*3/uL (ref 0.0–0.1)
Basophils Relative: 0.8 % (ref 0.0–3.0)
Eosinophils Absolute: 0.7 10*3/uL (ref 0.0–0.7)
Eosinophils Relative: 10.9 % — ABNORMAL HIGH (ref 0.0–5.0)
HCT: 38.1 % (ref 36.0–46.0)
Hemoglobin: 12.7 g/dL (ref 12.0–15.0)
Lymphocytes Relative: 25.2 % (ref 12.0–46.0)
Lymphs Abs: 1.7 10*3/uL (ref 0.7–4.0)
MCHC: 33.2 g/dL (ref 30.0–36.0)
MCV: 93.3 fl (ref 78.0–100.0)
Monocytes Absolute: 0.5 10*3/uL (ref 0.1–1.0)
Monocytes Relative: 7.1 % (ref 3.0–12.0)
Neutro Abs: 3.7 10*3/uL (ref 1.4–7.7)
Neutrophils Relative %: 56 % (ref 43.0–77.0)
Platelets: 346 10*3/uL (ref 150.0–400.0)
RBC: 4.08 Mil/uL (ref 3.87–5.11)
RDW: 14.6 % (ref 11.5–15.5)
WBC: 6.7 10*3/uL (ref 4.0–10.5)

## 2022-03-25 MED ORDER — TRAZODONE HCL 50 MG PO TABS: 25.0000 mg | ORAL_TABLET | Freq: Every evening | ORAL | 3 refills | Status: DC | PRN

## 2022-03-25 NOTE — Patient Instructions (Signed)
Follow up in as needed or as scheduled We'll notify you of your lab results and make any changes if needed START the Trazodone nightly for sleep.  Start w/ 1/2 tab and then increase to 1 tab if needed Drink LOTS of fluids REST as needed Call with any questions or concerns Hang in there!!!

## 2022-03-25 NOTE — Progress Notes (Signed)
Subjective:    Patient ID: Virginia Matthews, female    DOB: [birth year], 66 y.o.   MRN: [id number]  HPI Sore throat- sxs started w/ sore throat, dull headache on 6/16. Then developed fatigue.  Tm 100.8.  developed mouth sores.  Then woke up and was almost unable to get out of bed due to severe joint pain.  Developed rash on lower legs.  Pt reports 'everything is better'.  Has not had a fever 'in days', joint pains have improved, rash is improving.  Mouth sores still present.  Is supposed to have Psoriasis infusion upcoming.  No known sick contacts.  Was in large groups.  Took 3 COVID tests and all were negative.  No known tick bites but was in [place].  Insomnia- it is most difficult to fall asleep.  Reports she has racing thoughts that keep her from sleeping.  Some nights will get 2-3 hrs, other nights won't sleep at all.  Pt knows insomnia is anxiety related.   Review of Systems For ROS see HPI     Objective:   Physical Exam Vitals reviewed.  Constitutional:      General: She is not in acute distress.    Appearance: She is well-developed. She is obese. She is not ill-appearing.  HENT:     Head: Normocephalic and atraumatic.     Right Ear: Tympanic membrane normal.     Left Ear: Tympanic membrane normal.     Nose: No mucosal edema or rhinorrhea.     Right Sinus: No maxillary sinus tenderness or frontal sinus tenderness.     Left Sinus: No maxillary sinus tenderness or frontal sinus tenderness.     Mouth/Throat:     Pharynx: Uvula midline. No oropharyngeal exudate or posterior oropharyngeal erythema.     Comments: Resolving lip sores (appears herpetic in nature) Eyes:     Conjunctiva/sclera: Conjunctivae normal.     Pupils: Pupils are equal, round, and reactive to light.  Cardiovascular:     Rate and Rhythm: Normal rate and regular rhythm.     Heart sounds: Normal heart sounds.  Pulmonary:     Effort: Pulmonary effort is normal. No respiratory distress.     Breath sounds: Normal  breath sounds. No wheezing.  Musculoskeletal:     Cervical back: Normal range of motion and neck supple.  Lymphadenopathy:     Cervical: No cervical adenopathy.  Skin:    General: Skin is warm and dry.     Findings: Rash (bilateral LEs w/ scattered petechiae that appears consistent w/ vasculitis) present.  Neurological:     General: No focal deficit present.     Mental Status: She is alert and oriented to person, place, and time.     Cranial Nerves: No cranial nerve deficit.     Motor: No weakness.     Coordination: Coordination normal.  Psychiatric:        Mood and Affect: Mood normal.        Behavior: Behavior normal.        Thought Content: Thought content normal.           Assessment & Plan:  Polyarthralgia- pt has known psoriatic arthritis but reports that during recent illness she had body aches and joint pains 'like never before'.  Will check labs to r/o tick borne illness and autoimmune process.  Pt expressed understanding and is in agreement w/ plan.   Rash- new.  Appears consistent w/ vasculitis but pt w/o any recent change in  activity level.  May be viral sequela but given her known psoriatic arthritis, wonder if this is another auto immune sx.  Check labs.  Encouraged her to discuss w/ Rheum.  Will follow.  Insomnia- new.  Pt knows this is anxiety driven.  Will start Trazodone and monitor

## 2022-03-26 ENCOUNTER — Telehealth: Payer: Self-pay

## 2022-03-26 NOTE — Telephone Encounter (Signed)
-----   Message from Midge Minium, MD sent at 03/26/2022  7:22 AM EDT ----- Blood count is normal and looks great w/ exception of high eosinophils (the allergy white blood cell).  Waiting on remainder of labs.

## 2022-03-26 NOTE — Telephone Encounter (Signed)
Informed pt of lab results and that we are waiting on remainder of labs

## 2022-03-27 ENCOUNTER — Telehealth: Payer: Self-pay

## 2022-03-27 DIAGNOSIS — Z1231 Encounter for screening mammogram for malignant neoplasm of breast: Secondary | ICD-10-CM

## 2022-03-27 NOTE — Telephone Encounter (Signed)
Informed pt of lab results  

## 2022-03-27 NOTE — Telephone Encounter (Signed)
-----   Message from Midge Minium, MD sent at 03/27/2022 10:06 AM EDT ----- No evidence of autoimmune process- great news!

## 2022-03-27 NOTE — Telephone Encounter (Signed)
-----   Message from Midge Minium, MD sent at 03/27/2022  7:24 AM EDT ----- Thankfully no evidence of lyme

## 2022-03-28 LAB — ANA: Anti Nuclear Antibody (ANA): NEGATIVE

## 2022-03-28 LAB — B. BURGDORFI ANTIBODIES: B burgdorferi Ab IgG+IgM: <0.9 index

## 2022-03-28 LAB — ROCKY MTN SPOTTED FVR ABS PNL(IGG+IGM)
RMSF IgG: NOT DETECTED
RMSF IgM: NOT DETECTED

## 2022-04-01 ENCOUNTER — Ambulatory Visit
Admission: RE | Admit: 2022-04-01 | Discharge: 2022-04-01 | Disposition: A | Discharge status: Home / Self Care | Payer: Medicare Other | Source: Ambulatory Visit | Attending: Family Medicine | Admitting: Family Medicine

## 2022-04-01 ENCOUNTER — Telehealth: Payer: Self-pay

## 2022-04-01 DIAGNOSIS — Z1231 Encounter for screening mammogram for malignant neoplasm of breast: Secondary | ICD-10-CM

## 2022-04-01 NOTE — Telephone Encounter (Signed)
-----   Message from Midge Minium, MD sent at 03/31/2022  5:29 PM EDT ----- No evidence of RMSF- great news!

## 2022-04-01 NOTE — Telephone Encounter (Signed)
Informed pt of lab results  

## 2022-04-03 DIAGNOSIS — Z79899 Other long term (current) drug therapy: Secondary | ICD-10-CM | POA: Diagnosis not present

## 2022-04-03 DIAGNOSIS — L4050 Arthropathic psoriasis, unspecified: Secondary | ICD-10-CM | POA: Diagnosis not present

## 2022-05-04 ENCOUNTER — Encounter: Payer: Self-pay | Admitting: Family Medicine

## 2022-05-04 ENCOUNTER — Ambulatory Visit (INDEPENDENT_AMBULATORY_CARE_PROVIDER_SITE_OTHER): Payer: Medicare Other | Admitting: Family Medicine

## 2022-05-04 VITALS — BP 118/80 | HR 68 | Temp 97.8°F | Resp 16 | Ht 66.0 in | Wt 291.0 lb

## 2022-05-04 DIAGNOSIS — R21 Rash and other nonspecific skin eruption: Secondary | ICD-10-CM | POA: Diagnosis not present

## 2022-05-04 MED ORDER — TRIAMCINOLONE ACETONIDE 0.1 % EX OINT: 1.0000 | TOPICAL_OINTMENT | Freq: Two times a day (BID) | CUTANEOUS | 3 refills | Status: DC

## 2022-05-04 MED ORDER — CEPHALEXIN 500 MG PO CAPS: 500.0000 mg | ORAL_CAPSULE | Freq: Three times a day (TID) | ORAL | 0 refills | Status: AC

## 2022-05-04 NOTE — Progress Notes (Signed)
   Subjective:    Patient ID: Virginia Matthews, female    DOB: 10-31-1955, 66 y.o.   MRN: [id number]  HPI Rash- first noticed on Thursday.  Bilateral lower legs.  Not itchy.  Warm to touch.  Increased LE edema since rash appeared.  Some discomfort.  No recent outdoor activities.  Rash is not spreading.  It remains localized to lower legs.  No increase in walking.  Has been applying lotion.     Review of Systems For ROS see HPI     Objective:   Physical Exam Vitals reviewed.  Constitutional:      General: She is not in acute distress.    Appearance: Normal appearance. She is not ill-appearing.  Cardiovascular:     Pulses: Normal pulses.  Musculoskeletal:     Right lower leg: Edema (trace) present.     Left lower leg: Edema (trace) present.  Skin:    General: Skin is warm and dry.     Findings: Rash (patchy erythematous maculopapular rash on anterior and posterior lower legs bilaterally) present.  Neurological:     General: No focal deficit present.     Mental Status: She is alert and oriented to person, place, and time.  Psychiatric:        Mood and Affect: Mood normal.        Behavior: Behavior normal.        Thought Content: Thought content normal.           Assessment & Plan:   Rash- new.  does not appear to be plant dermatitis, not vascular as areas easily blanch w/ pressure.  Not itchy.  Mildly TTP.  Anterior lesions are warm and concerning for early cellulitis.  Start Keflex TID x10 days and use topical triamcinolone.  Pt expressed understanding and is in agreement w/ plan.

## 2022-05-04 NOTE — Patient Instructions (Signed)
Follow up as needed or as scheduled Start the Keflex 3x/day w/ meals x10 days Use the Triamcinolone ointment twice daily to help w/ inflammation Cool compresses to help w/ pain and swelling Call with any questions or concerns- particularly if things are changing or worsening Hang in there!!

## 2022-05-07 ENCOUNTER — Ambulatory Visit (INDEPENDENT_AMBULATORY_CARE_PROVIDER_SITE_OTHER): Payer: Medicare Other

## 2022-05-07 DIAGNOSIS — Z0000 Encounter for general adult medical examination without abnormal findings: Secondary | ICD-10-CM | POA: Diagnosis not present

## 2022-05-07 NOTE — Progress Notes (Signed)
Subjective:   Virginia Matthews is a 66 y.o. female who presents for Medicare Annual (Subsequent) preventive examination.   I connected with Virginia Matthews  today by telephone and verified that I am speaking with the correct person using two identifiers. Location patient: home Location provider: work Persons participating in the virtual visit: patient, provider.   I discussed the limitations, risks, security and privacy concerns of performing an evaluation and management service by telephone and the availability of in person appointments. I also discussed with the patient that there may be a patient responsible charge related to this service. The patient expressed understanding and verbally consented to this telephonic visit.    Interactive audio and video telecommunications were attempted between this provider and patient, however failed, due to patient having technical difficulties OR patient did not have access to video capability.  We continued and completed visit with audio only.    Review of Systems     Cardiac Risk Factors include: advanced age (>43mn, >>60women)     Objective:    Today's Vitals   There is no height or weight on file to calculate BMI.     05/07/2022   11:25 AM 06/06/2020    4:42 PM 07/16/2016    9:00 AM 07/09/2016    2:59 PM 09/10/2015    9:39 AM 09/02/2015    4:22 PM 08/13/2015   10:53 AM  Advanced Directives  Does Patient Have a Medical Advance Directive? Yes Yes No No No No No  Type of AP[place]amed[place]of AFriendship [place] VillageLiving will [place]ocon[place] EstatesLiving will       Copy of [place] Chart? No - copy requested No - copy requested       Would patient like information on creating a medical advance directive?  No - Guardian declined No - patient declined information No - patient declined information No - patient declined information  No - patient declined information    Current Medications  (verified) Outpatient Encounter Medications as of 05/07/2022  Medication Sig   albuterol (VENTOLIN HFA) 108 (90 Base) MCG/ACT inhaler Inhale 2 puffs into the lungs every 6 hours as needed for wheezing or shortness of breath.   b complex vitamins tablet Take 1 tablet by mouth daily.   beclomethasone (QVAR REDIHALER) 80 MCG/ACT inhaler Inhale 1 puff into the lungs 2 (two) times daily.   calcipotriene-betamethasone (TACLONEX) ointment Apply topically daily.   cephALEXin (KEFLEX) 500 MG capsule Take 1 capsule (500 mg total) by mouth 3 (three) times daily for 30 doses.   escitalopram (LEXAPRO) 10 MG tablet TAKE 1 TABLET BY MOUTH ONCE DAILY   fluticasone (FLONASE) 50 MCG/ACT nasal spray Place 2 sprays into both nostrils daily.   hydrocortisone valerate cream (WESTCORT) 0.2 %    inFLIXimab-axxq (AVSOLA) 100 MG injection Inject into the vein.   lisinopril (ZESTRIL) 20 MG tablet Take 1 tablet (20 mg total) by mouth daily.   loratadine (CLARITIN) 10 MG tablet Take 10 mg by mouth once.   Multiple Vitamins-Minerals (CENTRUM SILVER PO) Take 1 tablet by mouth daily. Use as directed   Omega-3 Fatty Acids (FISH OIL PO) Take 1 capsule by mouth daily.   pantoprazole (PROTONIX) 40 MG tablet TAKE 1 TABLET BY MOUTH ONCE DAILY   triamcinolone ointment (KENALOG) 0.1 % Apply 1 Application topically 2 (two) times daily.   TURMERIC PO Take 1 capsule by mouth 2 (two) times daily.   VITAMIN D, CHOLECALCIFEROL, PO Take 2,000 Units  by mouth daily.    No facility-administered encounter medications on file as of 05/07/2022.    Allergies (verified) Adhesive [tape]   History: Past Medical History:  Diagnosis Date   Breast mass, left 06/2016   Carpal tunnel syndrome on both sides    GERD (gastroesophageal reflux disease)    Heart murmur    Hypertension    states under control with med., has been on med. x 3-4 yr.   LVH (left ventricular hypertrophy)    moderate, per echo 01/2016   Moderate aortic regurgitation  07/03/2020   Obesity    PONV (postoperative nausea and vomiting)    Psoriatic arthritis ([place])    Rosacea    Thoracic ascending aortic aneurysm [facility])    Past Surgical History:  Procedure Laterality Date   BREAST EXCISIONAL BIOPSY Left    benign   COLONOSCOPY WITH PROPOFOL  09/12/2015   LAPAROSCOPIC ENDOMETRIOSIS FULGURATION     MICRODISCECTOMY LUMBAR Right 07/16/2011   L4-5   PELVIC LAPAROSCOPY     DIAG LAP   RADIOACTIVE SEED GUIDED EXCISIONAL BREAST BIOPSY Left 07/16/2016   Procedure: LEFT RADIOACTIVE SEED GUIDED EXCISIONAL BREAST BIOPSY;  Surgeon: Rolm Bookbinder, MD;  Location: [facility];  Service: General;  Laterality: Left;   Family History  Problem Relation Age of Onset   Breast cancer Mother    Cancer Father        prostate cancer, lymphoma   Heart disease Father    Hypertension Father    CAD Father    Cancer Brother        prostate cancer   Asthma Brother    CAD Brother    Hypertension Brother    Social History   Socioeconomic History   Marital status: Married    Spouse name: Not on file   Number of children: Not on file   Years of education: Not on file   Highest education level: Not on file  Occupational History   Not on file  Tobacco Use   Smoking status: Never   Smokeless tobacco: Never  Vaping Use   Vaping Use: Never used  Substance and Sexual Activity   Alcohol use: Yes    Alcohol/week: 0.0 standard drinks of alcohol    Comment: socially   Drug use: No   Sexual activity: Never    Partners: Male    Comment: 1st intercourse- 65, partners- married- 16 yrs   Other Topics Concern   Not on file  Social History Narrative   Not on file   Social Determinants of Health   Financial Resource Strain: Low Risk  (05/07/2022)   Overall Financial Resource Strain (CARDIA)    Difficulty of Paying Living Expenses: Not hard at all  Food Insecurity: No Food Insecurity (05/07/2022)   Hunger Vital Sign    Worried About Running Out of Food in  the Last Year: Never true    Ran Out of Food in the Last Year: Never true  Transportation Needs: No Transportation Needs (05/07/2022)   PRAPARE - Hydrologist (Medical): No    Lack of Transportation (Non-Medical): No  Physical Activity: Inactive (05/07/2022)   Exercise Vital Sign    Days of Exercise per Week: 0 days    Minutes of Exercise per Session: 0 min  Stress: No Stress Concern Present (05/07/2022)   [place]    Feeling of Stress : Not at all  Social Connections: Moderately Integrated (05/07/2022)  Social Connection and Isolation Panel [NHANES]    Frequency of Communication with Friends and Family: Three times a week    Frequency of Social Gatherings with Friends and Family: Three times a week    Attends Religious Services: More than 4 times per year    Active Member of Clubs or Organizations: No    Attends Archivist Meetings: Never    Marital Status: Married    Tobacco Counseling Counseling given: Not Answered   Clinical Intake:  Pre-visit preparation completed: Yes  Pain : No/denies pain     Nutritional Risks: None Diabetes: No  How often do you need to have someone help you when you read instructions, pamphlets, or other written materials from your doctor or pharmacy?: 1 - Never What is the last grade level you completed in school?: college  Diabetic?no   Interpreter Needed?: No  Information entered by :: L>Wilson,LPN   Activities of Daily Living    05/07/2022   11:30 AM 05/07/2022    9:51 AM  In your present state of health, do you have any difficulty performing the following activities:  Hearing? 0 0  Vision? 0 0  Difficulty concentrating or making decisions? 0 0  Walking or climbing stairs? 0 0  Dressing or bathing? 0 0  Doing errands, shopping? 0 0  Preparing Food and eating ? N N  Using the Toilet? N N  In the past six months, have you  accidently leaked urine? N N  Do you have problems with loss of bowel control? N N  Managing your Medications? N N  Managing your Finances? N N  Housekeeping or managing your Housekeeping? N N    Patient Care Team: Midge Minium, MD as PCP - General (Family Medicine) Skeet Latch, MD as PCP - Cardiology (Cardiology) Kennith Center, RD as Dietitian (Family Medicine) Kate Shipper, MD as Consulting Physician (Gastroenterology) Gavin Pound, MD as Consulting Physician (Rheumatology) Rolm Bookbinder, MD as Consulting Physician (Dermatology) Princess Bruins, MD as Consulting Physician (Obstetrics and Gynecology)  Indicate any recent Medical Services you may have received from other than [facility] providers in the past year (date may be approximate).     Assessment:   This is a routine wellness examination for Remonia.  Hearing/Vision screen Vision Screening - Comments:: Annual eye exams wear glasses   Dietary issues and exercise activities discussed: Current Exercise Habits: The patient does not participate in regular exercise at present, Exercise limited by: None identified   Goals Addressed   None    Depression Screen    05/07/2022   11:26 AM 05/07/2022   11:24 AM 05/04/2022    8:36 AM 03/25/2022    8:22 AM 01/30/2022    9:47 AM 10/01/2021   12:55 PM 09/03/2021   10:35 AM  PHQ 2/9 Scores  PHQ - 2 Score 0 0 '1 2 3 2 2  '$ PHQ- 9 Score   '4 7 9 8 8    '$ Fall Risk    05/07/2022   11:25 AM 05/07/2022    9:51 AM 05/04/2022    8:36 AM 03/25/2022    8:22 AM 01/30/2022    9:48 AM  Fall Risk   Falls in the past year? 0 1 0 0 1  Number falls in past yr: 0 0 0 0 0  Injury with Fall? 0 1 0 0 1  Risk for fall due to :   No Fall Risks No Fall Risks History of fall(s)  Follow up Falls  evaluation completed;Education provided  Falls evaluation completed Falls evaluation completed Falls evaluation completed    FALL RISK PREVENTION PERTAINING TO THE HOME:  Any stairs in or around the home? No   If so, are there any without handrails? No  Home free of loose throw rugs in walkways, pet beds, electrical cords, etc? Yes  Adequate lighting in your home to reduce risk of falls? Yes   ASSISTIVE DEVICES UTILIZED TO PREVENT FALLS:  Life alert? No  Use of a cane, walker or w/c? No  Grab bars in the bathroom? Yes  Shower chair or bench in shower? Yes  Elevated toilet seat or a handicapped toilet? Yes     Cognitive Function:  Normal cognitive status assessed by telephone conversation  by this Nurse Health Advisor. No abnormalities found.        05/07/2022   11:31 AM  6CIT Screen  What Year? 0 points  What month? 0 points  What time? 0 points  Count back from 20 0 points  Months in reverse 0 points  Repeat phrase 0 points  Total Score 0 points    Immunizations Immunization History  Administered Date(s) Administered   Fluad Quad(high Dose 65+) 07/15/2021   Influenza, Seasonal, Injecte, Preservative Fre 06/24/2015   Influenza,inj,Quad PF,6+ Mos 07/05/2013, 07/10/2016, 07/31/2020   Influenza-Unspecified 07/05/2017, 07/19/2018, 07/06/2019   PFIZER Comirnaty(Gray Top)Covid-19 Tri-Sucrose Vaccine 01/30/2021   PFIZER(Purple Top)SARS-COV-2 Vaccination 10/06/2019, 10/27/2019, 06/10/2020   Pfizer Covid-19 Vaccine Bivalent Booster 40yr & up 07/30/2021   Tdap 04/20/2017    TDAP status: Up to date  Flu Vaccine status: Up to date  Pneumococcal vaccine status: Due, Education has been provided regarding the importance of this vaccine. Advised may receive this vaccine at local pharmacy or Health Dept. Aware to provide a copy of the vaccination record if obtained from local pharmacy or Health Dept. Verbalized acceptance and understanding.  Covid-19 vaccine status: Completed vaccines  Qualifies for Shingles Vaccine? Yes   Zostavax completed No   Shingrix Completed?: No.    Education has been provided regarding the importance of this vaccine. Patient has been advised to call  insurance company to determine out of pocket expense if they have not yet received this vaccine. Advised may also receive vaccine at local pharmacy or Health Dept. Verbalized acceptance and understanding.  Screening Tests Health Maintenance  Topic Date Due   COVID-19 Vaccine (6 - Pfizer risk series) 09/24/2021   INFLUENZA VACCINE  04/28/2022   Pneumonia Vaccine 66 Years old (1 - PCV) 09/03/2022 (Originally 04/14/2021)   MAMMOGRAM  04/02/2023   COLONOSCOPY (Pts 45-411yrInsurance coverage will need to be confirmed)  09/11/2025   TETANUS/TDAP  04/21/2027   Hepatitis C Screening  Completed   HPV VACCINES  Aged Out   DEXA SCAN  Discontinued   Zoster Vaccines- Shingrix  Discontinued    Health Maintenance  Health Maintenance Due  Topic Date Due   COVID-19 Vaccine (6 - Pfizer risk series) 09/24/2021   INFLUENZA VACCINE  04/28/2022    Colorectal cancer screening: Type of screening: Colonoscopy. Completed 09/12/2015. Repeat every 10 years  Mammogram status: Completed 04/01/2022. Repeat every year  Bone Density status: Ordered patient declined . Pt provided with contact info and advised to call to schedule appt.  Lung Cancer Screening: (Low Dose CT Chest recommended if Age 66-80ears, 30 pack-year currently smoking OR have quit w/in 15years.) does not qualify.   Lung Cancer Screening Referral: n/a  Additional Screening:  Hepatitis C Screening: does not qualify;  Vision Screening: Recommended annual ophthalmology exams for early detection of glaucoma and other disorders of the eye. Is the patient up to date with their annual eye exam?  Yes  Who is the provider or what is the name of the office in which the patient attends annual eye exams? Dr.Miller  If pt is not established with a provider, would they like to be referred to a provider to establish care? No .   Dental Screening: Recommended annual dental exams for proper oral hygiene  Community Resource Referral / Chronic Care  Management: CRR required this visit?  No   CCM required this visit?  No      Plan:     I have personally reviewed and noted the following in the patient's chart:   Medical and social history Use of alcohol, tobacco or illicit drugs  Current medications and supplements including opioid prescriptions.  Functional ability and status Nutritional status Physical activity Advanced directives List of other physicians Hospitalizations, surgeries, and ER visits in previous 12 months Vitals Screenings to include cognitive, depression, and falls Referrals and appointments  In addition, I have reviewed and discussed with patient certain preventive protocols, quality metrics, and best practice recommendations. A written personalized care plan for preventive services as well as general preventive health recommendations were provided to patient.     Daphane Shepherd, LPN   1/74/0814   Nurse Notes: none

## 2022-05-07 NOTE — Patient Instructions (Signed)
[name] , Thank you for taking time to come for your Medicare Wellness Visit. I appreciate your ongoing commitment to your health goals. Please review the following plan we discussed and let me know if I can assist you in the future.   Screening recommendations/referrals: Colonoscopy: 09/12/2015 Mammogram: 04/01/2022 Bone Density: declined at this time  Recommended yearly ophthalmology/optometry visit for glaucoma screening and checkup Recommended yearly dental visit for hygiene and checkup  Vaccinations: Influenza vaccine: completed  Pneumococcal vaccine: due  Tdap vaccine: 04/20/2017 Shingles vaccine: will consider   Advanced directives: yes  Conditions/risks identified: none   Next appointment: none    Preventive Care 38 Years and Older, Female Preventive care refers to lifestyle choices and visits with your health care provider that can promote health and wellness. What does preventive care include? A yearly physical exam. This is also called an annual well check. Dental exams once or twice a year. Routine eye exams. Ask your health care provider how often you should have your eyes checked. Personal lifestyle choices, including: Daily care of your teeth and gums. Regular physical activity. Eating a healthy diet. Avoiding tobacco and drug use. Limiting alcohol use. Practicing safe sex. Taking low-dose aspirin every day. Taking vitamin and mineral supplements as recommended by your health care provider. What happens during an annual well check? The services and screenings done by your health care provider during your annual well check will depend on your age, overall health, lifestyle risk factors, and family history of disease. Counseling  Your health care provider may ask you questions about your: Alcohol use. Tobacco use. Drug use. Emotional well-being. Home and relationship well-being. Sexual activity. Eating habits. History of falls. Memory and ability to  understand (cognition). Work and work Statistician. Reproductive health. Screening  You may have the following tests or measurements: Height, weight, and BMI. Blood pressure. Lipid and cholesterol levels. These may be checked every 5 years, or more frequently if you are over 68 years old. Skin check. Lung cancer screening. You may have this screening every year starting at age 53 if you have a 30-pack-year history of smoking and currently smoke or have quit within the past 15 years. Fecal occult blood test (FOBT) of the stool. You may have this test every year starting at age 5. Flexible sigmoidoscopy or colonoscopy. You may have a sigmoidoscopy every 5 years or a colonoscopy every 10 years starting at age 56. Hepatitis C blood test. Hepatitis B blood test. Sexually transmitted disease (STD) testing. Diabetes screening. This is done by checking your blood sugar (glucose) after you have not eaten for a while (fasting). You may have this done every 1-3 years. Bone density scan. This is done to screen for osteoporosis. You may have this done starting at age 54. Mammogram. This may be done every 1-2 years. Talk to your health care provider about how often you should have regular mammograms. Talk with your health care provider about your test results, treatment options, and if necessary, the need for more tests. Vaccines  Your health care provider may recommend certain vaccines, such as: Influenza vaccine. This is recommended every year. Tetanus, diphtheria, and acellular pertussis (Tdap, Td) vaccine. You may need a Td booster every 10 years. Zoster vaccine. You may need this after age 34. Pneumococcal 13-valent conjugate (PCV13) vaccine. One dose is recommended after age 93. Pneumococcal polysaccharide (PPSV23) vaccine. One dose is recommended after age 61. Talk to your health care provider about which screenings and vaccines you need and how  often you need them. This information is not  intended to replace advice given to you by your health care provider. Make sure you discuss any questions you have with your health care provider. Document Released: 10/11/2015 Document Revised: 06/03/2016 Document Reviewed: 07/16/2015 Elsevier Interactive Patient Education  2017 [place] Prevention in the Home Falls can cause injuries. They can happen to people of all ages. There are many things you can do to make your home safe and to help prevent falls. What can I do on the outside of my home? Regularly fix the edges of walkways and driveways and fix any cracks. Remove anything that might make you trip as you walk through a door, such as a raised step or threshold. Trim any bushes or trees on the path to your home. Use bright outdoor lighting. Clear any walking paths of anything that might make someone trip, such as rocks or tools. Regularly check to see if handrails are loose or broken. Make sure that both sides of any steps have handrails. Any raised decks and porches should have guardrails on the edges. Have any leaves, snow, or ice cleared regularly. Use sand or salt on walking paths during winter. Clean up any spills in your garage right away. This includes oil or grease spills. What can I do in the bathroom? Use night lights. Install grab bars by the toilet and in the tub and shower. Do not use towel bars as grab bars. Use non-skid mats or decals in the tub or shower. If you need to sit down in the shower, use a plastic, non-slip stool. Keep the floor dry. Clean up any water that spills on the floor as soon as it happens. Remove soap buildup in the tub or shower regularly. Attach bath mats securely with double-sided non-slip rug tape. Do not have throw rugs and other things on the floor that can make you trip. What can I do in the bedroom? Use night lights. Make sure that you have a light by your bed that is easy to reach. Do not use any sheets or blankets that are  too big for your bed. They should not hang down onto the floor. Have a firm chair that has side arms. You can use this for support while you get dressed. Do not have throw rugs and other things on the floor that can make you trip. What can I do in the kitchen? Clean up any spills right away. Avoid walking on wet floors. Keep items that you use a lot in easy-to-reach places. If you need to reach something above you, use a strong step stool that has a grab bar. Keep electrical cords out of the way. Do not use floor polish or wax that makes floors slippery. If you must use wax, use non-skid floor wax. Do not have throw rugs and other things on the floor that can make you trip. What can I do with my stairs? Do not leave any items on the stairs. Make sure that there are handrails on both sides of the stairs and use them. Fix handrails that are broken or loose. Make sure that handrails are as long as the stairways. Check any carpeting to make sure that it is firmly attached to the stairs. Fix any carpet that is loose or worn. Avoid having throw rugs at the top or bottom of the stairs. If you do have throw rugs, attach them to the floor with carpet tape. Make sure that you have a  light switch at the top of the stairs and the bottom of the stairs. If you do not have them, ask someone to add them for you. What else can I do to help prevent falls? Wear shoes that: Do not have high heels. Have rubber bottoms. Are comfortable and fit you well. Are closed at the toe. Do not wear sandals. If you use a stepladder: Make sure that it is fully opened. Do not climb a closed stepladder. Make sure that both sides of the stepladder are locked into place. Ask someone to hold it for you, if possible. Clearly mark and make sure that you can see: Any grab bars or handrails. First and last steps. Where the edge of each step is. Use tools that help you move around (mobility aids) if they are needed. These  include: Canes. Walkers. Scooters. Crutches. Turn on the lights when you go into a dark area. Replace any light bulbs as soon as they burn out. Set up your furniture so you have a clear path. Avoid moving your furniture around. If any of your floors are uneven, fix them. If there are any pets around you, be aware of where they are. Review your medicines with your doctor. Some medicines can make you feel dizzy. This can increase your chance of falling. Ask your doctor what other things that you can do to help prevent falls. This information is not intended to replace advice given to you by your health care provider. Make sure you discuss any questions you have with your health care provider. Document Released: 07/11/2009 Document Revised: 02/20/2016 Document Reviewed: 10/19/2014 Elsevier Interactive Patient Education  2017 [employer].

## 2022-05-11 DIAGNOSIS — L4050 Arthropathic psoriasis, unspecified: Secondary | ICD-10-CM | POA: Diagnosis not present

## 2022-05-11 DIAGNOSIS — M1991 Primary osteoarthritis, unspecified site: Secondary | ICD-10-CM | POA: Diagnosis not present

## 2022-05-11 DIAGNOSIS — L409 Psoriasis, unspecified: Secondary | ICD-10-CM | POA: Diagnosis not present

## 2022-05-11 DIAGNOSIS — M0579 Rheumatoid arthritis with rheumatoid factor of multiple sites without organ or systems involvement: Secondary | ICD-10-CM | POA: Diagnosis not present

## 2022-05-13 ENCOUNTER — Other Ambulatory Visit: Payer: Self-pay

## 2022-05-13 MED ORDER — ESCITALOPRAM OXALATE 10 MG PO TABS: ORAL_TABLET | Freq: Every day | ORAL | 1 refills | Status: DC

## 2022-05-13 MED ORDER — PANTOPRAZOLE SODIUM 40 MG PO TBEC: DELAYED_RELEASE_TABLET | Freq: Every day | ORAL | 1 refills | Status: DC

## 2022-05-25 ENCOUNTER — Encounter: Payer: Self-pay | Admitting: Family Medicine

## 2022-05-25 ENCOUNTER — Ambulatory Visit (INDEPENDENT_AMBULATORY_CARE_PROVIDER_SITE_OTHER): Payer: Medicare Other | Admitting: Family Medicine

## 2022-05-25 VITALS — BP 120/70 | HR 70 | Temp 98.9°F | Resp 17 | Ht 66.0 in | Wt 291.2 lb

## 2022-05-25 DIAGNOSIS — H6981 Other specified disorders of Eustachian tube, right ear: Secondary | ICD-10-CM

## 2022-05-25 DIAGNOSIS — M1991 Primary osteoarthritis, unspecified site: Secondary | ICD-10-CM | POA: Insufficient documentation

## 2022-05-25 MED ORDER — AZELASTINE HCL 0.1 % NA SOLN
1.0000 | Freq: Two times a day (BID) | NASAL | 12 refills | Status: DC
Start: 2022-05-25 — End: 2022-09-07

## 2022-05-25 NOTE — Patient Instructions (Signed)
Follow up as needed or as scheduled START Flonase every day ADD the Astelin 1 spray each nostril twice daily CONTINUE daily Claritin or Zyrtec Call with any questions or concerns Safe [place]! Enjoy your trip!!!

## 2022-05-25 NOTE — Progress Notes (Signed)
   Subjective:    Patient ID: Virginia Matthews, female    DOB: 11/07/1955, 66 y.o.   MRN: [id number]  HPI R ear pain- sxs started 'on and off' a 'couple of weeks ago'.  Tried OTC wax drops w/o relief.  It 'aches'.  No drainage from ear.  No dizziness.  Denies excessive nasal congestion.  No fevers.  No sxs on L.    Not using Flonase regularly.   Review of Systems For ROS see HPI     Objective:   Physical Exam Vitals reviewed.  Constitutional:      General: She is not in acute distress.    Appearance: Normal appearance. She is well-developed. She is obese.  HENT:     Head: Normocephalic and atraumatic.     Right Ear: Tympanic membrane is retracted.     Left Ear: Tympanic membrane normal.     Nose: Mucosal edema and rhinorrhea present.     Right Sinus: No maxillary sinus tenderness or frontal sinus tenderness.     Left Sinus: No maxillary sinus tenderness or frontal sinus tenderness.     Mouth/Throat:     Pharynx: Posterior oropharyngeal erythema (w/ PND) present.  Eyes:     Conjunctiva/sclera: Conjunctivae normal.     Pupils: Pupils are equal, round, and reactive to light.  Cardiovascular:     Rate and Rhythm: Normal rate and regular rhythm.     Heart sounds: Normal heart sounds.  Pulmonary:     Effort: Pulmonary effort is normal. No respiratory distress.     Breath sounds: Normal breath sounds. No wheezing or rales.  Musculoskeletal:     Cervical back: Normal range of motion and neck supple.  Lymphadenopathy:     Cervical: No cervical adenopathy.  Skin:    General: Skin is warm and dry.  Neurological:     General: No focal deficit present.     Mental Status: She is alert and oriented to person, place, and time.  Psychiatric:        Mood and Affect: Mood normal.        Behavior: Behavior normal.        Thought Content: Thought content normal.           Assessment & Plan:  Eustachian Tube Dysfxn- new.  Reviewed dx w/ pt.  Discussed importance of daily Flonase.   Will add Astelin to improve congestion.  Pt expressed understanding and is in agreement w/ plan.

## 2022-05-29 DIAGNOSIS — L4050 Arthropathic psoriasis, unspecified: Secondary | ICD-10-CM | POA: Diagnosis not present

## 2022-05-29 DIAGNOSIS — Z79899 Other long term (current) drug therapy: Secondary | ICD-10-CM | POA: Diagnosis not present

## 2022-05-29 DIAGNOSIS — R5383 Other fatigue: Secondary | ICD-10-CM | POA: Diagnosis not present

## 2022-07-20 ENCOUNTER — Ambulatory Visit (INDEPENDENT_AMBULATORY_CARE_PROVIDER_SITE_OTHER): Payer: Medicare Other | Admitting: Family Medicine

## 2022-07-20 DIAGNOSIS — Z23 Encounter for immunization: Secondary | ICD-10-CM | POA: Diagnosis not present

## 2022-07-20 NOTE — Progress Notes (Signed)
Pt received her high dose flu vaccine today in left deltoid . Tolerated injection well

## 2022-07-27 DIAGNOSIS — L4050 Arthropathic psoriasis, unspecified: Secondary | ICD-10-CM | POA: Diagnosis not present

## 2022-08-04 ENCOUNTER — Telehealth (HOSPITAL_BASED_OUTPATIENT_CLINIC_OR_DEPARTMENT_OTHER): Payer: Self-pay | Admitting: *Deleted

## 2022-08-04 NOTE — Telephone Encounter (Signed)
Spoke with patient regarding new appointment date and time for the Echocardiogram appointment  --original date 09/02/22---move to 09/04/22 at 8:  am.  Patient voiced her understanding.

## 2022-08-12 ENCOUNTER — Ambulatory Visit: Payer: Medicare Other | Admitting: Family Medicine

## 2022-08-12 VITALS — BP 122/78 | Ht 68.0 in | Wt 288.0 lb

## 2022-08-12 DIAGNOSIS — M7551 Bursitis of right shoulder: Secondary | ICD-10-CM

## 2022-08-12 MED ORDER — METHYLPREDNISOLONE ACETATE 40 MG/ML IJ SUSP
40.0000 mg | Freq: Once | INTRAMUSCULAR | Status: AC
Start: 2022-08-12 — End: 2022-08-12
  Administered 2022-08-12: 40 mg via INTRA_ARTICULAR

## 2022-08-12 NOTE — Progress Notes (Unsigned)
  Virginia Matthews - 66 y.o. female MRN [id number]  Date of birth: 1956/03/23    CHIEF COMPLAINT:   Right shoulder pain    SUBJECTIVE:   HPI:  Pleasant 66 year old female comes to clinic to be evaluated for right shoulder pain.  She was previously evaluated here in May and June of this year for the same.  At that time she was found to have some subacromial bursal inflammation on ultrasound and was diagnosed with subacromial bursitis.  She took naproxen twice a day and did some home exercises.  It got better.  However, over the last several weeks the pain has returned.  She now has a constant dull ache in the anterior lateral part of the right shoulder that is worse with overhead movements.  It is also made worse with sleeping on it.  Feels better at rest.  She has stopped taking naproxen due to gastritis.  She has continued exercises focusing on the rotator cuff with her personal trainer but has not found those to be giving her any relief.  She denies any radiation of the pain, numbness or tingling in the arm.  ROS:     See HPI  PERTINENT  PMH / PSH FH / / SH:  Past Medical, Surgical, Social, and Family History Reviewed & Updated in the EMR.  Pertinent findings include:  Subacromial bursitis  OBJECTIVE: BP 122/78   Ht '5\' 8"'$  (1.727 m)   Wt 288 lb (130.6 kg)   BMI 43.79 kg/m   Physical Exam:  Vital signs are reviewed.  GEN: Alert and oriented, NAD Pulm: Breathing unlabored PSY: normal mood, congruent affect  MSK: Right shoulder -no obvious deformity.  She is nontender to palpation over the clavicle, AC joint, or biceps tendon.  She has full range of motion of the shoulder in all directions but does have a positive painful arc.  She has 5/5 strength with resisted flexion, extension, external and internal rotation.  She does have a negative Hawkins test.  Positive Neer's test.  And pain and weakness with empty can testing.  Negative speeds and Yergason's Test.  She is neurovascularly intact  distally.  ASSESSMENT & PLAN:  1.  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  She has not been responding to anti-inflammatories or home exercises recently.  I think she will benefit from a ultrasound-guided subacromial bursa injection today.  We discussed risk/benefits/alternatives and she agrees to the injection.  She tolerated the procedure well.  See procedure note for details.  She can follow-up here in 6 weeks if needed.  If no improvement, I would get her into formal physical therapy for this.  All questions were answered she agrees to plan.  PROCEDURE NOTE: Procedure performed: subacromial corticosteroid injection; ultrasound guided  Consent obtained and verified. Time-out conducted. Noted no overlying erythema, induration, or other signs of local infection. The right lateral subacromial space was visualized under ultrasound in the long axis.  The overlying skin was palpated and marked and prepped in a sterile fashion. Topical analgesic spray: Ethyl chloride. Joint: Right subacromial Needle: 25 gauge, 1.5 inch Completed without difficulty. Meds: 3 cc 1% lidocaine without epinephrine, 40 mg depo-medrol   Advised to call if fevers/chills, erythema, induration, drainage, or persistent bleeding.   Dortha Kern, MD PGY-4, Sports Medicine Fellow [place]

## 2022-08-13 ENCOUNTER — Encounter: Payer: Self-pay | Admitting: Family Medicine

## 2022-08-14 DIAGNOSIS — D2271 Melanocytic nevi of right lower limb, including hip: Secondary | ICD-10-CM | POA: Diagnosis not present

## 2022-08-14 DIAGNOSIS — C44722 Squamous cell carcinoma of skin of right lower limb, including hip: Secondary | ICD-10-CM | POA: Diagnosis not present

## 2022-08-14 DIAGNOSIS — L570 Actinic keratosis: Secondary | ICD-10-CM | POA: Diagnosis not present

## 2022-08-14 DIAGNOSIS — D2261 Melanocytic nevi of right upper limb, including shoulder: Secondary | ICD-10-CM | POA: Diagnosis not present

## 2022-08-14 DIAGNOSIS — Z85828 Personal history of other malignant neoplasm of skin: Secondary | ICD-10-CM | POA: Diagnosis not present

## 2022-08-14 DIAGNOSIS — L718 Other rosacea: Secondary | ICD-10-CM | POA: Diagnosis not present

## 2022-08-14 DIAGNOSIS — L918 Other hypertrophic disorders of the skin: Secondary | ICD-10-CM | POA: Diagnosis not present

## 2022-08-14 DIAGNOSIS — D225 Melanocytic nevi of trunk: Secondary | ICD-10-CM | POA: Diagnosis not present

## 2022-09-02 ENCOUNTER — Other Ambulatory Visit (HOSPITAL_BASED_OUTPATIENT_CLINIC_OR_DEPARTMENT_OTHER): Payer: Medicare Other

## 2022-09-04 ENCOUNTER — Ambulatory Visit (INDEPENDENT_AMBULATORY_CARE_PROVIDER_SITE_OTHER): Payer: Medicare Other

## 2022-09-04 DIAGNOSIS — I7121 Aneurysm of the ascending aorta, without rupture: Secondary | ICD-10-CM

## 2022-09-04 DIAGNOSIS — I10 Essential (primary) hypertension: Secondary | ICD-10-CM

## 2022-09-04 LAB — ECHOCARDIOGRAM COMPLETE
Area-P 1/2: 3.27 cm2
P 1/2 time: 1083 msec
S' Lateral: 3.74 cm

## 2022-09-07 ENCOUNTER — Ambulatory Visit (HOSPITAL_BASED_OUTPATIENT_CLINIC_OR_DEPARTMENT_OTHER): Payer: Medicare Other | Admitting: Cardiovascular Disease

## 2022-09-07 ENCOUNTER — Encounter (HOSPITAL_BASED_OUTPATIENT_CLINIC_OR_DEPARTMENT_OTHER): Payer: Self-pay | Admitting: Cardiovascular Disease

## 2022-09-07 VITALS — BP 126/82 | HR 65 | Ht 68.0 in | Wt 289.6 lb

## 2022-09-07 DIAGNOSIS — I7121 Aneurysm of the ascending aorta, without rupture: Secondary | ICD-10-CM | POA: Diagnosis not present

## 2022-09-07 DIAGNOSIS — I10 Essential (primary) hypertension: Secondary | ICD-10-CM | POA: Diagnosis not present

## 2022-09-07 LAB — COMPREHENSIVE METABOLIC PANEL
ALT: 43 IU/L — ABNORMAL HIGH (ref 0–32)
AST: 35 IU/L (ref 0–40)
Albumin/Globulin Ratio: 1.4 (ref 1.2–2.2)
Albumin: 4.3 g/dL (ref 3.9–4.9)
Alkaline Phosphatase: 74 IU/L (ref 44–121)
BUN/Creatinine Ratio: 18 (ref 12–28)
BUN: 16 mg/dL (ref 8–27)
Bilirubin Total: 0.3 mg/dL (ref 0.0–1.2)
CO2: 22 mmol/L (ref 20–29)
Calcium: 10.1 mg/dL (ref 8.7–10.3)
Chloride: 103 mmol/L (ref 96–106)
Creatinine, Ser: 0.88 mg/dL (ref 0.57–1.00)
Globulin, Total: 3.1 g/dL (ref 1.5–4.5)
Glucose: 98 mg/dL (ref 70–99)
Potassium: 4.7 mmol/L (ref 3.5–5.2)
Sodium: 139 mmol/L (ref 134–144)
Total Protein: 7.4 g/dL (ref 6.0–8.5)
eGFR: 72 mL/min/{1.73_m2} (ref >59)

## 2022-09-07 LAB — LIPID PANEL
Chol/HDL Ratio: 2.7 ratio (ref 0.0–4.4)
Cholesterol, Total: 166 mg/dL (ref 100–199)
HDL: 61 mg/dL (ref >39)
LDL Chol Calc (NIH): 89 mg/dL (ref 0–99)
Triglycerides: 88 mg/dL (ref 0–149)
VLDL Cholesterol Cal: 16 mg/dL (ref 5–40)

## 2022-09-07 LAB — HEMOGLOBIN A1C
Est. average glucose Bld gHb Est-mCnc: 111 mg/dL
Hgb A1c MFr Bld: 5.5 % (ref 4.8–5.6)

## 2022-09-07 NOTE — Assessment & Plan Note (Signed)
Continue working with Physiological scientist.  We did discuss GLP-1 agonist.  She is not interested at this time and will continue with diet and exercise.

## 2022-09-07 NOTE — Assessment & Plan Note (Signed)
Blood pressure is well-controlled.  Continue lisinorpil.  Continue working with Clinical research associate.

## 2022-09-07 NOTE — Patient Instructions (Addendum)
Medication Instructions:  Your physician recommends that you continue on your current medications as directed. Please refer to the Current Medication list given to you today.   *If you need a refill on your cardiac medications before your next appointment, please call your pharmacy*  Lab Work: LP/CMET/A1C TODAY   If you have labs (blood work) drawn today and your tests are completely normal, you will receive your results only by: [place] (if you have MyChart) OR A paper copy in the mail If you have any lab test that is abnormal or we need to change your treatment, we will call you to review the results.  Testing/Procedures: Your physician has requested that you have an echocardiogram. Echocardiography is a painless test that uses sound waves to create images of your heart. It provides your doctor with information about the size and shape of your heart and how well your heart's chambers and valves are working. This procedure takes approximately one hour. There are no restrictions for this procedure. Please do NOT wear cologne, perfume, aftershave, or lotions (deodorant is allowed). Please arrive 15 minutes prior to your appointment time. TO BE DONE IN 1 YEAR   Follow-Up: At [facility], you and your health needs are our priority.  As part of our continuing mission to provide you with exceptional heart care, we have created designated Provider Care Teams.  These Care Teams include your primary Cardiologist (physician) and Advanced Practice Providers (APPs -  Physician Assistants and Nurse Practitioners) who all work together to provide you with the care you need, when you need it.  We recommend signing up for the patient portal called "MyChart".  Sign up information is provided on this After Visit Summary.  MyChart is used to connect with patients for Virtual Visits (Telemedicine).  Patients are able to view lab/test results, encounter notes, upcoming appointments, etc.   Non-urgent messages can be sent to your provider as well.   To learn more about what you can do with MyChart, go to [email or web address].    Your next appointment:    AFTER ECHO IN 12 month(s)  The format for your next appointment:   In Person  Provider:   DR [facility]

## 2022-09-07 NOTE — Assessment & Plan Note (Signed)
Stable on repeat imaging.  BP controlled.  Repeat echo in one year.  Avoid heavy weight lifting.

## 2022-09-07 NOTE — Progress Notes (Signed)
Cardiology Office Note   Date:  09/07/2022   ID:  Virginia Matthews, Virginia Matthews 15-Sep-1956, MRN [id number]  PCP:  Midge Minium, MD  Cardiologist:   Skeet Latch, MD   No chief complaint on file.   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  Virginia Matthews initially presented 12/2015 with atypical chest pain that occurred in stressful situations.  She also has a family history of premature CAD.  Therefore she was referred for cardiac CT angiography and coronary calcium scoring.  Her coronary calcium score was 0 and there were no obstructive lesions.  It showed a dilated pulmonary artery suggestive of pulmonary hypertension and her ascending thoracic aortic was 4.2 cm.  She was referred for echocardiography that revealed PASP 37 mmHg and was otherwise unremarkable.   She had a repeat echo 01/2020 that revealed LVEF 60 to 65% with normal diastolic function.  Her aorta was 4.4 cm but she had moderate aortic regurgitation.  She followed up with Almyra Deforest, PA, on 07/2016 for palpitations.  She wore a 24-hour Holter 08/2016 that revealed frequent PVCs and occasional PACs.  She was started on metoprolol.   Metoprolol was stopped because her palpitations were better controlled.  She was started on lisinopril due to poorly controlled blood pressure. She had a repeat echo 01/2021 that revealed LVEF 60 to 65% with mild LVH.  [place] ascending aorta was 4.4 cm. Her last echo 08/2022 was stable. Ascending aorta was 4.1cm.  Today, she states that she is doing well from a CV perspective. Her blood pressures have been well controlled at home with rates of 110s-120s over 80s.  She reports that she has been dealing with persistent orthopedic problems including right shoulder pain and chronic bilateral knee pain. She is not doing any PT and does not have any upcoming surgeries  planned. She is working with a Physiological scientist twice a week without much difficulty. She denies any exertional symptoms.   We discussed trying a GLP-1 agonist but she prefers to continue with diet and exercise to work toward weight reduction.  Virginia Matthews denies chest pain, chest pressure, dyspnea at rest or with exertion, claudication, PND, orthopnea, or leg swelling. Denies cough, fever, chills, nausea, or vomiting. Denies syncope, presyncope, or snoring. Denies dizziness or lightheadedness.    Past Medical History:  Diagnosis Date   Breast mass, left 06/2016   Carpal tunnel syndrome on both sides    GERD (gastroesophageal reflux disease)    Heart murmur    Hypertension    states under control with med., has been on med. x 3-4 yr.   LVH (left ventricular hypertrophy)    moderate, per echo 01/2016   Moderate aortic regurgitation 07/03/2020   Obesity    PONV (postoperative nausea and vomiting)    Psoriatic arthritis ([place])    Rosacea    Thoracic ascending aortic aneurysm [facility])     Past Surgical History:  Procedure Laterality Date   BREAST EXCISIONAL BIOPSY Left    benign   COLONOSCOPY WITH PROPOFOL  09/12/2015   LAPAROSCOPIC ENDOMETRIOSIS FULGURATION     MICRODISCECTOMY LUMBAR Right 07/16/2011   L4-5   PELVIC LAPAROSCOPY     DIAG LAP   RADIOACTIVE SEED GUIDED EXCISIONAL BREAST BIOPSY Left 07/16/2016   Procedure: LEFT RADIOACTIVE SEED GUIDED EXCISIONAL BREAST BIOPSY;  Surgeon: Rolm Bookbinder, MD;  Location: [facility];  Service: General;  Laterality: Left;     Current Outpatient Medications  Medication Sig Dispense Refill   albuterol (VENTOLIN HFA) 108 (90 Base) MCG/ACT inhaler Inhale 2 puffs into [place] lungs every 6 hours as needed for wheezing or shortness of breath. 8.5 g 3   b complex vitamins tablet Take 1 tablet by mouth daily.     beclomethasone (QVAR REDIHALER) 80 MCG/ACT inhaler Inhale 1 puff into [place] lungs 2 (two) times daily. 1 each 3    calcipotriene-betamethasone (TACLONEX) ointment Apply topically daily.     escitalopram (LEXAPRO) 10 MG tablet TAKE 1 TABLET BY MOUTH ONCE DAILY 90 tablet 1   famotidine (PEPCID) 20 MG tablet Take 20 mg by mouth as needed for heartburn or indigestion.     fluticasone (FLONASE) 50 MCG/ACT nasal spray Place 2 sprays into both nostrils daily. 16 g 3   hydrocortisone valerate cream (WESTCORT) 0.2 %   3   inFLIXimab-axxq (AVSOLA) 100 MG injection Inject into [place] vein.     lisinopril (ZESTRIL) 20 MG tablet Take 1 tablet (20 mg total) by mouth daily. 90 tablet 3   loratadine (CLARITIN) 10 MG tablet Take 10 mg by mouth once.     Multiple Vitamins-Minerals (CENTRUM SILVER PO) Take 1 tablet by mouth daily. Use as directed     Omega-3 Fatty Acids (FISH OIL PO) Take 1 capsule by mouth daily.     pantoprazole (PROTONIX) 40 MG tablet TAKE 1 TABLET BY MOUTH ONCE DAILY 90 tablet 1   triamcinolone ointment (KENALOG) 0.1 % Apply 1 Application topically 2 (two) times daily. 80 g 3   TURMERIC PO Take 1 capsule by mouth 2 (two) times daily.     VITAMIN D, CHOLECALCIFEROL, PO Take 2,000 Units by mouth daily.      No current facility-administered medications for this visit.    Allergies:   Adhesive [tape]    Social History:  Virginia Matthews  reports that she has never smoked. She has never used smokeless tobacco. She reports current alcohol use. She reports that she does not use drugs.   Family History:  Virginia Matthews's family history includes Asthma in her brother; Breast cancer in her mother; CAD in her brother and father; Cancer in her brother and father; Heart disease in her father; Hypertension in her brother and father.    ROS:  Please see [place] history of present illness.   Otherwise, review of systems are positive for none.   All other systems are reviewed and negative.    PHYSICAL EXAM: VS:  BP 126/82 (BP Location: Left Arm, Matthews Position: Sitting, Cuff Size: Large)   Pulse 65   Ht '5\' 8"'$  (1.727 m)   Wt  289 lb 9.6 oz (131.4 kg)   BMI 44.03 kg/m  , BMI Body mass index is 44.03 kg/m. GENERAL:  Well appearing HEENT: Pupils equal round and reactive, fundi not visualized, oral mucosa unremarkable NECK:  No jugular venous distention, waveform within normal limits, carotid upstroke brisk and symmetric, no bruits, no thyromegaly LUNGS:  Clear to auscultation bilaterally HEART:  RRR.  PMI not displaced or sustained,S1 and S2 within normal limits, no S3, no S4, no clicks, no rubs, no murmurs ABD:  Flat, positive bowel sounds normal in frequency in pitch, no bruits, no rebound, no guarding, no midline pulsatile mass, no hepatomegaly, no splenomegaly EXT:  2 plus pulses throughout, no edema, no cyanosis no clubbing SKIN:  No rashes no nodules NEURO:  Cranial nerves II through XII  grossly intact, motor grossly intact throughout [facility]:  Cognitively intact, oriented to person place and time   EKG:  EKG ordered today, 09/07/22.  [place]ia ekg ordered today demonstrates sinus rhythm rate of 65bpm with LVH. 06/2020: sinus bradycardia.  Rate 58 bpm. 09/12/2021: Sinus rhythm.  Rate 73 bpm.  Minimal criteria for LVH.   Echo 09/04/2022: Left ventricular ejection fraction, by estimation, is 55 to 60%. Left ventricular ejection fraction by PLAX is 57 %. [place] left ventricle has normal function. [place] left ventricle has no regional wall motion abnormalities. There is mild left ventricular hypertrophy of [place] septal segment. Left ventricular diastolic parameters were normal. [place] average left ventricular global longitudinal strain is -17.9 %. [place] global longitudinal strain is normal. 1. 2. Right ventricular systolic function is normal. [place] right ventricular size is normal. [place] mitral valve is normal in structure. No evidence of mitral valve regurgitation. No evidence of mitral stenosis. 3. [place] aortic valve is tricuspid. Aortic valve regurgitation is mild. No aortic stenosis is present. Aortic regurgitation PHT measures  1083 msec. 4. [place] inferior vena cava is normal in size with greater than 50% respiratory variability, suggesting right atrial pressure of 3 mmHg.   Echo 01/2021: 1. Left ventricular ejection fraction, by estimation, is 60 to 65%. [place]  left ventricle has normal function. [place] left ventricle has no regional  wall motion abnormalities. There is mild concentric left ventricular  hypertrophy. Left ventricular diastolic  parameters were normal. Elevated left atrial pressure.   2. Right ventricular systolic function is normal. [place] right ventricular  size is normal. There is normal pulmonary artery systolic pressure. [place]  estimated right ventricular systolic pressure is 84.1 mmHg.   3. [place] mitral valve is normal in structure. Trivial mitral valve  regurgitation.   4. [place] aortic valve is tricuspid. Aortic valve regurgitation is moderate.   5. Aortic dilatation noted. There is mild dilatation of [place] ascending  aorta measuring 44 mm.    Cardiac CT-A 01/16/16: Aortic Valve:  Trileaflet, normal thickness, no calcifications. Coronary Arteries:  Normal coronary origin.  Right dominance. Left main is a large artery with no plaque. LAD is a large caliber artery that gives rise to 3 small diagonal branches and wraps around [place] apex. There is no plaque. 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 LCX is a medium caliber vessel that gives rise to two small OM branches, there is no plaque. RCA is a large caliber that gives rise to PDA and PLA. There is no plaque. Other findings: Dilated pulmonary artery measuring 36 x 31 mm suggestive of pulmonary hypertension. Normal pulmonary vein drainage into [place] left atrium. A large left atrial appendage without evidence of a thrombus. IMPRESSION: 1. Coronary calcium score of 0. This was 0 percentile for age and sex matched control. 2. Normal coronary origin with right dominance. 3. No evidence of CAD. 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 4. Dilated pulmonary artery suggestive of pulmonary hypertension.  IMPRESSION: 1. Evidence of probable air trapping in [place] visualize lung bases, suggesting small airways disease. 2. Ectasia of [place] ascending thoracic aorta (4.2 cm in diameter).      24 Hour Holter Monitor 09/07/16:   Quality: Fair.  Baseline artifact. Predominant rhythm: sinus rhythm Average heart rate: 66 bpm Max heart rate: 119 bpm Min heart rate: 45 bpm   Frequent PVCs (1.4%) Ventricular trigeminy noted Occasional PACs    Recent Labs: 01/30/2022: ALT 28; BUN 21; Creatinine, Ser  0.81; Potassium 4.1; Sodium 139; TSH 2.22 03/25/2022: Hemoglobin 12.7; Platelets 346.0    Lipid Panel    Component Value Date/Time   CHOL 180 07/29/2021 1346   TRIG 161.0 (H) 07/29/2021 1346   HDL 53.50 07/29/2021 1346   CHOLHDL 3 07/29/2021 1346   VLDL 32.2 07/29/2021 1346   LDLCALC 95 07/29/2021 1346      Wt Readings from Last 3 Encounters:  09/07/22 289 lb 9.6 oz (131.4 kg)  08/12/22 288 lb (130.6 kg)  05/25/22 291 lb 4 oz (132.1 kg)      ASSESSMENT AND PLAN:  HYPERTENSION, BENIGN ESSENTIAL Blood pressure is well-controlled.  Continue lisinorpil.  Continue working with Clinical research associate.    Ascending aortic aneurysm (HCC) Stable on repeat imaging.  BP controlled.  Repeat echo in one year.  Avoid heavy weight lifting.  Severe obesity (BMI >= 40) ([place]) Continue working with Physiological scientist.  We did discuss GLP-1 agonist.  She is not interested at this time and will continue with diet and exercise.   Current medicines are reviewed at length with Virginia Matthews today.  Virginia Matthews does not have concerns regarding medicines.  [place] following changes have been made:  no change  Labs/ tests ordered today include:   No orders of [place] defined types were placed in this encounter.    Disposition:  FU in 1 year with Dr. Skeet Latch   I,Alexis Herring,acting as a scribe for Skeet Latch, MD.,have documented all relevant documentation on [place] behalf of Skeet Latch, MD,as directed by  Skeet Latch, MD while in [place] presence of Skeet Latch, MD.  I, [place] Oval Linsey, MD have reviewed all documentation for this visit.  [place] documentation of [place] exam, diagnosis, procedures, and orders on 09/07/2022 are all accurate and complete.   Signed, Gyselle Matthew C. Oval Linsey, MD, [facility]  09/07/2022 8:28 AM    [facility]

## 2022-09-16 ENCOUNTER — Encounter: Payer: Self-pay | Admitting: Family Medicine

## 2022-09-16 ENCOUNTER — Ambulatory Visit: Payer: Medicare Other | Admitting: Family Medicine

## 2022-09-16 VITALS — BP 125/79 | Ht 66.0 in | Wt 288.0 lb

## 2022-09-16 DIAGNOSIS — M7551 Bursitis of right shoulder: Secondary | ICD-10-CM

## 2022-09-16 NOTE — Progress Notes (Signed)
PCP: Midge Minium, MD  Subjective:   HPI: Patient is a 66 y.o. female here for right shoulder pain.  11/15: She was previously evaluated here in May and June of this year for the same.  At that time she was found to have some subacromial bursal inflammation on ultrasound and was diagnosed with subacromial bursitis.  She took naproxen twice a day and did some home exercises.  It got better.  However, over the last several weeks the pain has returned.  She now has a constant dull ache in the anterior lateral part of the right shoulder that is worse with overhead movements.  It is also made worse with sleeping on it.  Feels better at rest.  She has stopped taking naproxen due to gastritis.  She has continued exercises focusing on the rotator cuff with her personal trainer but has not found those to be giving her any relief.  She denies any radiation of the pain, numbness or tingling in the arm.  12/20: Patient reports some improvement of her right shoulder pain compared to last visit. About 80% improved following subacromial injection, home exercises. Bothers her with overhead motions still.  Past Medical History:  Diagnosis Date   Breast mass, left 06/2016   Carpal tunnel syndrome on both sides    GERD (gastroesophageal reflux disease)    Heart murmur    Hypertension    states under control with med., has been on med. x 3-4 yr.   LVH (left ventricular hypertrophy)    moderate, per echo 01/2016   Moderate aortic regurgitation 07/03/2020   Obesity    PONV (postoperative nausea and vomiting)    Psoriatic arthritis ([place])    Rosacea    Thoracic ascending aortic aneurysm ([place])     Current Outpatient Medications on File Prior to Visit  Medication Sig Dispense Refill   albuterol (VENTOLIN HFA) 108 (90 Base) MCG/ACT inhaler Inhale 2 puffs into the lungs every 6 hours as needed for wheezing or shortness of breath. 8.5 g 3   b complex vitamins tablet Take 1 tablet by mouth daily.      beclomethasone (QVAR REDIHALER) 80 MCG/ACT inhaler Inhale 1 puff into the lungs 2 (two) times daily. 1 each 3   calcipotriene-betamethasone (TACLONEX) ointment Apply topically daily.     escitalopram (LEXAPRO) 10 MG tablet TAKE 1 TABLET BY MOUTH ONCE DAILY 90 tablet 1   famotidine (PEPCID) 20 MG tablet Take 20 mg by mouth as needed for heartburn or indigestion.     fluticasone (FLONASE) 50 MCG/ACT nasal spray Place 2 sprays into both nostrils daily. 16 g 3   hydrocortisone valerate cream (WESTCORT) 0.2 %   3   inFLIXimab-axxq (AVSOLA) 100 MG injection Inject into the vein.     lisinopril (ZESTRIL) 20 MG tablet Take 1 tablet (20 mg total) by mouth daily. 90 tablet 3   loratadine (CLARITIN) 10 MG tablet Take 10 mg by mouth once.     Multiple Vitamins-Minerals (CENTRUM SILVER PO) Take 1 tablet by mouth daily. Use as directed     Omega-3 Fatty Acids (FISH OIL PO) Take 1 capsule by mouth daily.     pantoprazole (PROTONIX) 40 MG tablet TAKE 1 TABLET BY MOUTH ONCE DAILY 90 tablet 1   triamcinolone ointment (KENALOG) 0.1 % Apply 1 Application topically 2 (two) times daily. 80 g 3   TURMERIC PO Take 1 capsule by mouth 2 (two) times daily.     VITAMIN D, CHOLECALCIFEROL, PO Take 2,000 Units  by mouth daily.      No current facility-administered medications on file prior to visit.    Past Surgical History:  Procedure Laterality Date   BREAST EXCISIONAL BIOPSY Left    benign   COLONOSCOPY WITH PROPOFOL  09/12/2015   LAPAROSCOPIC ENDOMETRIOSIS FULGURATION     MICRODISCECTOMY LUMBAR Right 07/16/2011   L4-5   PELVIC LAPAROSCOPY     DIAG LAP   RADIOACTIVE SEED GUIDED EXCISIONAL BREAST BIOPSY Left 07/16/2016   Procedure: LEFT RADIOACTIVE SEED GUIDED EXCISIONAL BREAST BIOPSY;  Surgeon: Rolm Bookbinder, MD;  Location: [facility];  Service: General;  Laterality: Left;    Allergies  Allergen Reactions   Adhesive [Tape] Other (See Comments)    SKIN IRRITATION    BP 125/79   Ht '5\' 6"'$   (1.676 m)   Wt 288 lb (130.6 kg)   BMI 46.48 kg/m      05/28/2020    1:57 PM 07/22/2021    2:56 PM  [facility] [facility]  Frequency of aerobic exercise (# of days/week) 5 2  Average time in minutes 20 20  Frequency of strengthening activities (# of days/week) 0 2        No data to display              Objective:  Physical Exam:  Gen: NAD, comfortable in exam room  Right shoulder: No swelling, ecchymoses.  No gross deformity. No TTP. FROM with mild pain on full flexion/abduction. Negative Hawkins, Neers. Strength 5/5 with empty can and resisted internal/external rotation without pain. NV intact distally.   Assessment & Plan:  1. Right shoulder pain - 2/2 subacromial bursitis.  Improved with home exercises, subacromial injection.  Continue home exercises for 4-6 more weeks.  OTC medications if needed.  F/u prn.

## 2022-10-05 DIAGNOSIS — L4050 Arthropathic psoriasis, unspecified: Secondary | ICD-10-CM | POA: Diagnosis not present

## 2022-10-22 ENCOUNTER — Ambulatory Visit
Admission: RE | Admit: 2022-10-22 | Discharge: 2022-10-22 | Disposition: A | Discharge status: Home / Self Care | Payer: Medicare Other | Source: Ambulatory Visit | Attending: Sports Medicine | Admitting: Sports Medicine

## 2022-10-22 ENCOUNTER — Ambulatory Visit (INDEPENDENT_AMBULATORY_CARE_PROVIDER_SITE_OTHER): Payer: Medicare Other | Admitting: Sports Medicine

## 2022-10-22 VITALS — BP 122/80 | Ht 66.0 in | Wt 286.0 lb

## 2022-10-22 DIAGNOSIS — M170 Bilateral primary osteoarthritis of knee: Secondary | ICD-10-CM

## 2022-10-22 DIAGNOSIS — M1712 Unilateral primary osteoarthritis, left knee: Secondary | ICD-10-CM

## 2022-10-22 DIAGNOSIS — M76891 Other specified enthesopathies of right lower limb, excluding foot: Secondary | ICD-10-CM | POA: Diagnosis not present

## 2022-10-22 DIAGNOSIS — M25561 Pain in right knee: Secondary | ICD-10-CM | POA: Diagnosis not present

## 2022-10-22 DIAGNOSIS — M1711 Unilateral primary osteoarthritis, right knee: Secondary | ICD-10-CM

## 2022-10-22 MED ORDER — NAPROXEN 500 MG PO TABS: 500.0000 mg | ORAL_TABLET | Freq: Two times a day (BID) | ORAL | 0 refills | Status: DC | PRN

## 2022-10-22 NOTE — Assessment & Plan Note (Signed)
Patient presents with a month of right buttocks pain when sitting and going up stairs. On exam, she is tender to palpation of the ischial tuberosity. Suspect hamstring tendinopathy, potentially from activity at the gym. This has already improved with heat but should continue to improve with restarting NSAIDs for her knee pain.

## 2022-10-22 NOTE — Progress Notes (Addendum)
Established Patient Office Visit  Subjective   Patient ID: Virginia Matthews, female    DOB: Feb 05, 1956  Age: 67 y.o. MRN: [id number]  CC: Bilateral knee pain   HPI: Virginia Matthews is a 67 year old female with a history of GERD who presents to follow-up on chronic bilateral knee pain and new posterior buttocks pain. She has a known history of knee DJD and feels like her pain has worsened in frequency and intensity. She has been working with a Clinical research associate for 6 months at the gym but her knee pain is preventing her from doing cardio like she wants. She is not currently having knee pain today but has some days her knees feel "stuck" and extremely stiff and painful in the morning, then seem to improve throughout the day. She was taking Naproxen 500 mg daily prior to December but stopped her PPI due to reading a study on PPI and increased dementia risk, and subsequently stopped her Naproxen. She has never had a gastric ulcer and now takes Pepcid nightly for GERD. She has tried icing and compression for her knees which helps some. She is also concerned about the stability of her knees. She denies swelling or recent injury and endorses an occasional buckling sensation that may be due to pain. She last had a steroid injection in her left knee in 06/2021 which brought relief.   Her posterior buttocks pain started about a month ago. It is a localized sharp pain that can increase to 7-8/10 with sitting on hard surfaces or going up stairs. She has tried heat and quad stretches with her trainer that seem to help. The pain has improved over time. She denies numbness or tingling. She is not having any pain at her groin.   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 She has an upcoming trip to [place] with friends and will be traveling to [place] with her husband and son this summer.     Objective:     BP 122/80   Ht '5\' 6"'$  (1.676 m)   Wt 286 lb (129.7 kg)   BMI 46.16 kg/m   Vital signs reviewed.   Physical Exam  Right Knee: No effusion or ecchymosis. Mildly tender to palpation of lateral joint line, nontender to palpation of medial joint line. Full ROM. Crepitus with extension. Strength 5/5 throughout. No instability with valgus or varus stress. Negative anterior drawer.  Left Knee: No effusion or ecchymosis. Nontender to palpation of medial or lateral joint line. Full ROM, flexion elicits pain. Crepitus with extension. Strength 5/5 throughout. No instability with valgus or varus stress. Negative anterior drawer.  Right buttocks: Tender to deep palpation of ischial tuberosity.   Imaging  Left Knee XR 07/18/2021 FINDINGS: No evidence of fracture, dislocation, or joint effusion. Moderate to severe joint space narrowing of the medial tibiofemoral compartment. Tricompartmental osteophyte formation. No aggressive appearing focal bone abnormality. Soft tissues are unremarkable.   IMPRESSION: 1. No acute displaced fracture or dislocation. 2. Moderate to severe joint space narrowing of the medial tibiofemoral compartment.    Assessment & Plan:   Problem List Items Addressed This Visit       Musculoskeletal and Integument   Hamstring tendonitis of right thigh    Patient presents with a month of right buttocks pain when sitting and going up stairs. On exam, she is tender to palpation of the ischial tuberosity. Suspect hamstring tendinopathy, potentially from activity at the gym. This has already  improved with heat but should continue to improve with restarting NSAIDs for her knee pain.       Osteoarthritis of knees, bilateral - Primary    Patient presents with worsening chronic knee pain. Last knee XR from 06/2021, will obtain updated imaging. She stopped her daily NSAIDs which is likely contributing to pain. Discussed knee replacement and BMI requirements which she is aware of. She wants to do more cardio and work to become a surgical candidate, however she  does not feel ready for knee replacement at this time. Plan to restart Naproxen '500mg'$  BID PRN, reminded her the importance of taking with food. She just got compression sleeves for her knees that she will use when walking and at the gym. Also discussed quad strengthening exercises including straight leg and lateral leg raises. She will work on these with her trainer. Holding off on steroid injection today but will follow-up in 6 weeks and consider injection at that time as she is going to [place] in 10 weeks.       Relevant Medications   naproxen (NAPROSYN) 500 MG tablet   Other Relevant Orders   DG Knee AP/LAT W/Sunrise Right (Completed)    Sabino Dick, MS4 [facility]   Patient seen and evaluated with the medical student.  I agree with the above plan of care.  X-rays are reviewed.  There are tricompartmental changes in both knees.  She is bone-on-bone in the medial compartment in the right knee and approaching bone-on-bone in the left knee.  Treatment as above and follow-up in approximately 6 weeks.  She has a trip planned in 10 weeks so we will likely proceed with cortisone injections at her follow-up visit.  We will also prescribe Naprosyn 500 mg twice daily as needed with food.  Given the fact that the majority of her DJD is in the medial compartment, she may also benefit from a [employer] custom medial unloader brace.  But for now she will try her new braces which she has ordered from [facility] and should be arriving soon.  This note was dictated using Dragon naturally speaking software and may contain errors in syntax, spelling, or content which have not been identified prior to signing this note.

## 2022-10-22 NOTE — Assessment & Plan Note (Addendum)
Patient presents with worsening chronic knee pain. Last knee XR from 06/2021, will obtain updated imaging. She stopped her daily NSAIDs which is likely contributing to pain. Discussed knee replacement and BMI requirements which she is aware of. She wants to do more cardio and work to become a surgical candidate, however she does not feel ready for knee replacement at this time. Plan to restart Naproxen '500mg'$  BID PRN, reminded her the importance of taking with food. She just got compression sleeves for her knees that she will use when walking and at the gym. Also discussed quad strengthening exercises including straight leg and lateral leg raises. She will work on these with her trainer. Holding off on steroid injection today but will follow-up in 6 weeks and consider injection at that time as she is going to [place] in 10 weeks.

## 2022-10-23 ENCOUNTER — Encounter: Payer: Self-pay | Admitting: Sports Medicine

## 2022-11-09 ENCOUNTER — Other Ambulatory Visit: Payer: Self-pay

## 2022-11-09 DIAGNOSIS — F419 Anxiety disorder, unspecified: Secondary | ICD-10-CM

## 2022-11-09 MED ORDER — ESCITALOPRAM OXALATE 10 MG PO TABS: ORAL_TABLET | Freq: Every day | ORAL | 1 refills | Status: DC

## 2022-11-30 DIAGNOSIS — L4050 Arthropathic psoriasis, unspecified: Secondary | ICD-10-CM | POA: Diagnosis not present

## 2022-12-02 DIAGNOSIS — M1991 Primary osteoarthritis, unspecified site: Secondary | ICD-10-CM | POA: Diagnosis not present

## 2022-12-02 DIAGNOSIS — M25511 Pain in right shoulder: Secondary | ICD-10-CM | POA: Diagnosis not present

## 2022-12-02 DIAGNOSIS — L4050 Arthropathic psoriasis, unspecified: Secondary | ICD-10-CM | POA: Diagnosis not present

## 2022-12-02 DIAGNOSIS — M0579 Rheumatoid arthritis with rheumatoid factor of multiple sites without organ or systems involvement: Secondary | ICD-10-CM | POA: Diagnosis not present

## 2022-12-02 DIAGNOSIS — M25561 Pain in right knee: Secondary | ICD-10-CM | POA: Diagnosis not present

## 2022-12-02 DIAGNOSIS — Z6841 Body Mass Index (BMI) 40.0 and over, adult: Secondary | ICD-10-CM | POA: Diagnosis not present

## 2022-12-02 DIAGNOSIS — L409 Psoriasis, unspecified: Secondary | ICD-10-CM | POA: Diagnosis not present

## 2022-12-02 DIAGNOSIS — Z79899 Other long term (current) drug therapy: Secondary | ICD-10-CM | POA: Diagnosis not present

## 2022-12-03 ENCOUNTER — Ambulatory Visit (INDEPENDENT_AMBULATORY_CARE_PROVIDER_SITE_OTHER): Payer: Medicare Other | Admitting: Sports Medicine

## 2022-12-03 VITALS — BP 148/83 | Ht 66.0 in | Wt 279.0 lb

## 2022-12-03 DIAGNOSIS — M1711 Unilateral primary osteoarthritis, right knee: Secondary | ICD-10-CM

## 2022-12-03 DIAGNOSIS — M76891 Other specified enthesopathies of right lower limb, excluding foot: Secondary | ICD-10-CM

## 2022-12-03 MED ORDER — METHYLPREDNISOLONE ACETATE 40 MG/ML IJ SUSP
40.0000 mg | Freq: Once | INTRAMUSCULAR | Status: AC
  Administered 2022-12-03: 40 mg via INTRA_ARTICULAR

## 2022-12-03 NOTE — Patient Instructions (Signed)
Our [employer] rep will reach out to you regarding the knee brace. Linton Flemings Ph: [phone]

## 2022-12-04 NOTE — Progress Notes (Signed)
   Subjective:    Patient ID: Virginia Matthews, female    DOB: [birth year], 67 y.o.   MRN: [id number]  HPI  Lakshmi presents today for a right knee cortisone injection.  She is leaving for a trip to [place] in 5 weeks.  She purchased new braces for both of her knees and is found the brace to be helpful for the left knee.  Right knee continues to bother her however.  It was very painful several weeks ago.  Cortisone injections have historically been helpful.  Naprosyn, unfortunately, has caused some GI upset so she has discontinued that.  Recent x-rays showed bone-on-bone osteoarthritis in the right knee.    Review of Systems As above    Objective:   Physical Exam  Right knee: Range of motion is 0 to 120 degrees.  No obvious effusion.  Although there are  good solid endpoints to ligamentous stressing, there is some pseudolaxity of the MCL secondary to her advanced medial compartmental DJD.  Neurovascular intact distally.  Walking with an antalgic gait.  X-ray as above      Assessment & Plan:   Right knee pain secondary to end-stage medial compartmental DJD  Patient's knee is injected with cortisone today.  An anterior lateral approach is utilized.  She tolerates this without difficulty.  I think she may benefit from a custom medial unloader brace from [employer].  We will put her in contact with our brace representative.  Of note, she is also continue to struggle with some proximal right hamstring tendinopathy.  I recommended that we try formal physical therapy.  She will follow-up with me as needed.  Consent obtained and verified. Time-out conducted. Noted no overlying erythema, induration, or other signs of local infection. Skin prepped in a sterile fashion. Topical analgesic spray: Ethyl chloride. Joint: Right knee Needle: 25-gauge 1.5 inch Completed without difficulty. Meds: 3 cc 1% Xylocaine, 1 cc (40 mg) Depo-Medrol  Advised to call if fevers/chills, erythema, induration, drainage,  or persistent bleeding.   This note was dictated using Dragon naturally speaking software and may contain errors in syntax, spelling, or content which have not been identified prior to signing this note.

## 2022-12-08 ENCOUNTER — Other Ambulatory Visit: Payer: Self-pay

## 2022-12-08 ENCOUNTER — Ambulatory Visit: Payer: Medicare Other | Attending: Sports Medicine

## 2022-12-08 DIAGNOSIS — R252 Cramp and spasm: Secondary | ICD-10-CM

## 2022-12-08 DIAGNOSIS — M6281 Muscle weakness (generalized): Secondary | ICD-10-CM | POA: Diagnosis not present

## 2022-12-08 DIAGNOSIS — M79604 Pain in right leg: Secondary | ICD-10-CM | POA: Diagnosis not present

## 2022-12-08 DIAGNOSIS — R262 Difficulty in walking, not elsewhere classified: Secondary | ICD-10-CM | POA: Insufficient documentation

## 2022-12-08 NOTE — Therapy (Signed)
OUTPATIENT PHYSICAL THERAPY LOWER EXTREMITY EVALUATION   Patient Name: Virginia Matthews MRN: [id number] DOB:[birth year], 67 y.o., female Today's Date: 12/08/2022  END OF SESSION:  PT End of Session - 12/08/22 0844     Visit Number 1    Authorization Type Medicare A and B and BCBS supplemental    Authorization - Visit Number 1    Progress Note Due on Visit 10    PT Start Time [id number]    PT Stop Time 0918    PT Time Calculation (min) 40 min    Activity Tolerance Patient tolerated treatment well    Behavior During Therapy [facility] for tasks assessed/performed             Past Medical History:  Diagnosis Date   Breast mass, left 06/2016   Carpal tunnel syndrome on both sides    GERD (gastroesophageal reflux disease)    Heart murmur    Hypertension    states under control with med., has been on med. x 3-4 yr.   LVH (left ventricular hypertrophy)    moderate, per echo 01/2016   Moderate aortic regurgitation 07/03/2020   Obesity    PONV (postoperative nausea and vomiting)    Psoriatic arthritis ([place])    Rosacea    Thoracic ascending aortic aneurysm [facility])    Past Surgical History:  Procedure Laterality Date   BREAST EXCISIONAL BIOPSY Left    benign   COLONOSCOPY WITH PROPOFOL  09/12/2015   LAPAROSCOPIC ENDOMETRIOSIS FULGURATION     MICRODISCECTOMY LUMBAR Right 07/16/2011   L4-5   PELVIC LAPAROSCOPY     DIAG LAP   RADIOACTIVE SEED GUIDED EXCISIONAL BREAST BIOPSY Left 07/16/2016   Procedure: LEFT RADIOACTIVE SEED GUIDED EXCISIONAL BREAST BIOPSY;  Surgeon: Rolm Bookbinder, MD;  Location: [facility];  Service: General;  Laterality: Left;   Patient Active Problem List   Diagnosis Date Noted   Hamstring tendonitis of right thigh 10/22/2022   Primary localized osteoarthrosis of multiple sites 05/25/2022   Moderate aortic regurgitation 07/03/2020   Right knee pain 07/01/2017   Osteoarthritis of knees, bilateral 04/13/2017   Psoriatic arthritis ([place]) 04/17/2016    LVH (left ventricular hypertrophy) 02/11/2016   Ascending aortic aneurysm ([place]) 02/11/2016   Carotid bruit 12/31/2015   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 0000000   Left wrist pain 07/05/2015   Bilateral hip pain 07/05/2015   Leg size inequality 07/05/2015   Right facial numbness 12/21/2013   Anxiety and depression 05/13/2012   Physical exam, routine 05/13/2012   GERD (gastroesophageal reflux disease) 05/13/2012   Seasonal allergies    PCO (polycystic ovaries)    Ruptured lumbar disc    Endometriosis    HYPERTENSION, BENIGN ESSENTIAL 05/01/2010   NONSPEC ELEVATION OF LEVELS OF TRANSAMINASE/LDH 03/14/2009   Severe obesity (BMI >= 40) ([place]) 02/11/2009   RHINITIS 02/11/2009   ABDOMINAL PAIN, UNSPECIFIED SITE 02/11/2009    PCP: Midge Minium, MD   REFERRING PROVIDER: Thurman Coyer, DO  REFERRING DIAG: [phone] (ICD-10-CM) - Hamstring tendonitis of right thigh  THERAPY DIAG:  Pain in right leg - Plan: PT plan of care cert/re-cert  Difficulty in walking, not elsewhere classified - Plan: PT plan of care cert/re-cert  Muscle weakness (generalized) - Plan: PT plan of care cert/re-cert  Cramp and spasm - Plan: PT plan of care cert/re-cert  Rationale for Evaluation and Treatment: Rehabilitation  ONSET DATE: 12/03/2022  SUBJECTIVE:   SUBJECTIVE STATEMENT: Patient reports several months of right proximal hamstring  pain.  She had this prior to onset of right knee pain.  Right knee is in need of replacement but patient feels she is unable to do this at this time due to her disabled husband.  She reports most of her pain after a long day of being up on her feet and assisting her spouse.  She does have moments when she has 0/10 pain.  She is retired.  She hopes to get her pain under control and be able to exercise with her trainer.    PERTINENT HISTORY: MD appt with Dr. Micheline Chapman on 12/02/21: Patient's knee is injected with cortisone today. An  anterior lateral approach is utilized. She tolerates this without difficulty. I think she may benefit from a custom medial unloader brace from [employer]. We will put her in contact with our brace representative. Of note, she is also continue to struggle with some proximal right hamstring tendinopathy. I recommended that we try formal physical therapy. She will follow-up with me as needed.  PAIN:  Are you having pain?  0/10 at best and 7/10 at worst  PRECAUTIONS: None  WEIGHT BEARING RESTRICTIONS: No  FALLS:  Has patient fallen in last 6 months? No  LIVING ENVIRONMENT: Lives with: lives with their spouse Lives in: House/apartment Stairs: No   OCCUPATION: retired  PLOF: Independent, Independent with basic ADLs, Independent with household mobility without device, Independent with community mobility without device, Independent with homemaking with ambulation, Independent with gait, and Independent with transfers  PATIENT GOALS: to reduce pain and to be able to exercise without exacerbation of pain  NEXT MD VISIT: prn  OBJECTIVE:   DIAGNOSTIC FINDINGS: na  PATIENT SURVEYS:  FOTO 57, goal is 59  COGNITION: Overall cognitive status: Within functional limits for tasks assessed     SENSATION: WFL   MUSCLE LENGTH: Hamstrings: Right 45 deg   POSTURE:  right knee flexed on stance phase  PALPATION: Tender to palpation at right ischial tuberosity  LOWER EXTREMITY ROM:   LOWER EXTREMITY MMT:  Generally 4 to 4+/5,  right hamstring not tested due to current strain  FUNCTIONAL TESTS:  5 times sit to stand: complete next visit Timed up and go (TUG): complete next visit  GAIT: Distance walked: 30 feet Assistive device utilized: None Level of assistance: Complete Independence Comments: antalgic   TODAY'S TREATMENT:                                                                                                                              DATE: 12/08/22 Initial eval  completed and initiated HEP    PATIENT EDUCATION:  Education details: Initiated HEP Person educated: Patient Education method: Consulting civil engineer, Media planner, Verbal cues, and Handouts Education comprehension: verbalized understanding, returned demonstration, and verbal cues required  HOME EXERCISE PROGRAM: Access Code: [email or web address] URL: [email or web address] Date: 12/08/2022 Prepared by: Candyce Churn  Exercises - Standing Hamstring Stretch with Step  - 1 x daily - 7 x weekly - 3 sets -  10 reps - Seated Hamstring Stretch  - 1 x daily - 7 x weekly - 3 sets - 10 reps - Prone Hamstring Curl with Ankle Weight  - 1 x daily - 7 x weekly - 3 sets - 10 reps - Standing Hamstring Curl with Chair Support  - 1 x daily - 7 x weekly - 3 sets - 10 reps - Hamstring Mobilization with Foam Roll  - 1 x daily - 7 x weekly - 1 sets - 1 reps - 1 min hold  ASSESSMENT:  CLINICAL IMPRESSION: Patient is a 67 y.o. female who was seen today for physical therapy evaluation and treatment for right proximal hamstring pain.  She presents with tenderness to palpation at right ischial tuberosity, decreased hamstring flexibility and decreased overall function.  She would benefit from eccentric strengthening, hamstring stretching, cross friction massage, possibly dry needling to elongate the hamstring, and modalities for pain relief.   OBJECTIVE IMPAIRMENTS: Abnormal gait, difficulty walking, decreased ROM, decreased strength, increased fascial restrictions, increased muscle spasms, impaired flexibility, and pain.   ACTIVITY LIMITATIONS: lifting, bending, standing, squatting, sleeping, stairs, transfers, bed mobility, and locomotion level  PARTICIPATION LIMITATIONS: meal prep, cleaning, laundry, driving, shopping, community activity, occupation, yard work, and church  PERSONAL FACTORS: Fitness, Past/current experiences, Profession, and 1-2 comorbidities: Htn and obesity  are also affecting patient's functional  outcome.   REHAB POTENTIAL: Good  CLINICAL DECISION MAKING: Stable/uncomplicated  EVALUATION COMPLEXITY: Low   GOALS: Goals reviewed with patient? Yes  SHORT TERM GOALS: Target date: 01/05/2023   Pain report to be no greater than 4/10  Baseline: Goal status: INITIAL  2.  Patient will be independent with initial HEP  Baseline:  Goal status: INITIAL   LONG TERM GOALS: Target date: 02/02/2023    Patient to report pain no greater than 2/10  Baseline:  Goal status: INITIAL  2.  Patient to be independent with advanced HEP  Baseline:  Goal status: INITIAL  3.  Patient to be able to fully participate in her workout regimen with her trainer Baseline:  Goal status: INITIAL  4.  Patient to be able to walk 1/2 mile without pain Baseline:  Goal status: INITIAL  5.  Patient to be able to care for her disabled husband without need for frequent rest breaks due to hamstring or right knee pain. Baseline:  Goal status: INITIAL  6.  Patient to be able to stand or walk for at least 15 min without leg pain  Baseline:  Goal status: INITIAL   PLAN:  PT FREQUENCY: 1-2x/week  PT DURATION: 8 weeks  PLANNED INTERVENTIONS: Therapeutic exercises, Therapeutic activity, Neuromuscular re-education, Balance training, Gait training, Patient/Family education, Self Care, Joint mobilization, Stair training, Aquatic Therapy, Dry Needling, Electrical stimulation, Cryotherapy, Moist heat, Taping, Vasopneumatic device, Ultrasound, Ionotophoresis '4mg'$ /ml Dexamethasone, Manual therapy, and Re-evaluation  PLAN FOR NEXT SESSION: Review HEP, Nustep, complete 5 times sit to stand and TUG, eccentric hamstring strengthening.    Anderson Malta B. Delanda Bulluck, PT 12/08/22 9:30 AM  [facility] [address] [place], [place] [address] Phone # [phone] Fax [phone]

## 2022-12-09 ENCOUNTER — Other Ambulatory Visit (HOSPITAL_BASED_OUTPATIENT_CLINIC_OR_DEPARTMENT_OTHER): Payer: Self-pay | Admitting: Cardiovascular Disease

## 2022-12-15 ENCOUNTER — Ambulatory Visit: Payer: Medicare Other | Admitting: Physical Therapy

## 2022-12-18 ENCOUNTER — Encounter: Payer: Medicare Other | Admitting: Physical Therapy

## 2022-12-21 NOTE — Therapy (Unsigned)
OUTPATIENT PHYSICAL THERAPY LOWER EXTREMITY TREATMENT   Patient Name: Virginia Matthews MRN: [id number] DOB:07/13/1956, 67 y.o., female Today's Date: 12/21/2022  END OF SESSION:    Past Medical History:  Diagnosis Date   Breast mass, left 06/2016   Carpal tunnel syndrome on both sides    GERD (gastroesophageal reflux disease)    Heart murmur    Hypertension    states under control with med., has been on med. x 3-4 yr.   LVH (left ventricular hypertrophy)    moderate, per echo 01/2016   Moderate aortic regurgitation 07/03/2020   Obesity    PONV (postoperative nausea and vomiting)    Psoriatic arthritis ([place])    Rosacea    Thoracic ascending aortic aneurysm [facility])    Past Surgical History:  Procedure Laterality Date   BREAST EXCISIONAL BIOPSY Left    benign   COLONOSCOPY WITH PROPOFOL  09/12/2015   LAPAROSCOPIC ENDOMETRIOSIS FULGURATION     MICRODISCECTOMY LUMBAR Right 07/16/2011   L4-5   PELVIC LAPAROSCOPY     DIAG LAP   RADIOACTIVE SEED GUIDED EXCISIONAL BREAST BIOPSY Left 07/16/2016   Procedure: LEFT RADIOACTIVE SEED GUIDED EXCISIONAL BREAST BIOPSY;  Surgeon: Rolm Bookbinder, MD;  Location: [place];  Service: General;  Laterality: Left;   Patient Active Problem List   Diagnosis Date Noted   Hamstring tendonitis of right thigh 10/22/2022   Primary localized osteoarthrosis of multiple sites 05/25/2022   Moderate aortic regurgitation 07/03/2020   Right knee pain 07/01/2017   Osteoarthritis of knees, bilateral 04/13/2017   Psoriatic arthritis ([place]) 04/17/2016   LVH (left ventricular hypertrophy) 02/11/2016   Ascending aortic aneurysm ([place]) 02/11/2016   Carotid bruit 12/31/2015   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 0000000   Left wrist pain 07/05/2015   Bilateral hip pain 07/05/2015   Leg size inequality 07/05/2015   Right facial numbness 12/21/2013   Anxiety and depression 05/13/2012   Physical exam, routine  05/13/2012   GERD (gastroesophageal reflux disease) 05/13/2012   Seasonal allergies    PCO (polycystic ovaries)    Ruptured lumbar disc    Endometriosis    HYPERTENSION, BENIGN ESSENTIAL 05/01/2010   NONSPEC ELEVATION OF LEVELS OF TRANSAMINASE/LDH 03/14/2009   Severe obesity (BMI >= 40) ([place]) 02/11/2009   RHINITIS 02/11/2009   ABDOMINAL PAIN, UNSPECIFIED SITE 02/11/2009    PCP: Midge Minium, MD   REFERRING PROVIDER: Thurman Coyer, DO  REFERRING DIAG: [id number] (ICD-10-CM) - Hamstring tendonitis of right thigh  THERAPY DIAG:  No diagnosis found.  Rationale for Evaluation and Treatment: Rehabilitation  ONSET DATE: 12/03/2022  SUBJECTIVE:   SUBJECTIVE STATEMENT: Patient reports several months of right proximal hamstring pain.  She had this prior to onset of right knee pain.  Right knee is in need of replacement but patient feels she is unable to do this at this time due to her disabled husband.  She reports most of her pain after a long day of being up on her feet and assisting her spouse.  She does have moments when she has 0/10 pain.  She is retired.  She hopes to get her pain under control and be able to exercise with her trainer.    PERTINENT HISTORY: MD appt with Dr. Micheline Chapman on 12/02/21: Patient's knee is injected with cortisone today. An anterior lateral approach is utilized. She tolerates this without difficulty. I think she may benefit from a custom medial unloader brace from [employer]. We will put her in contact with  our brace representative. Of note, she is also continue to struggle with some proximal right hamstring tendinopathy. I recommended that we try formal physical therapy. She will follow-up with me as needed.  PAIN:  Are you having pain?  0/10 at best and 7/10 at worst  PRECAUTIONS: None  WEIGHT BEARING RESTRICTIONS: No  FALLS:  Has patient fallen in last 6 months? No  LIVING ENVIRONMENT: Lives with: lives with their spouse Lives in:  House/apartment Stairs: No   OCCUPATION: retired  PLOF: Independent, Independent with basic ADLs, Independent with household mobility without device, Independent with community mobility without device, Independent with homemaking with ambulation, Independent with gait, and Independent with transfers  PATIENT GOALS: to reduce pain and to be able to exercise without exacerbation of pain  NEXT MD VISIT: prn  OBJECTIVE:   DIAGNOSTIC FINDINGS: na  PATIENT SURVEYS:  FOTO 57, goal is 43  COGNITION: Overall cognitive status: Within functional limits for tasks assessed     SENSATION: WFL   MUSCLE LENGTH: Hamstrings: Right 45 deg   POSTURE:  right knee flexed on stance phase  PALPATION: Tender to palpation at right ischial tuberosity  LOWER EXTREMITY ROM:   LOWER EXTREMITY MMT:  Generally 4 to 4+/5,  right hamstring not tested due to current strain  FUNCTIONAL TESTS:  5 times sit to stand: complete next visit Timed up and go (TUG): complete next visit  GAIT: Distance walked: 30 feet Assistive device utilized: None Level of assistance: Complete Independence Comments: antalgic   TODAY'S TREATMENT:                                                                                                                              DATE: 12/08/22 Initial eval completed and initiated HEP    PATIENT EDUCATION:  Education details: Initiated HEP Person educated: Patient Education method: Consulting civil engineer, Media planner, Verbal cues, and Handouts Education comprehension: verbalized understanding, returned demonstration, and verbal cues required  HOME EXERCISE PROGRAM: Access Code: [email or web address] URL: [email or web address] Date: 12/08/2022 Prepared by: Candyce Churn  Exercises - Standing Hamstring Stretch with Step  - 1 x daily - 7 x weekly - 3 sets - 10 reps - Seated Hamstring Stretch  - 1 x daily - 7 x weekly - 3 sets - 10 reps - Prone Hamstring Curl with Ankle Weight   - 1 x daily - 7 x weekly - 3 sets - 10 reps - Standing Hamstring Curl with Chair Support  - 1 x daily - 7 x weekly - 3 sets - 10 reps - Hamstring Mobilization with Foam Roll  - 1 x daily - 7 x weekly - 1 sets - 1 reps - 1 min hold  ASSESSMENT:  CLINICAL IMPRESSION: Patient is a 67 y.o. female who was seen today for physical therapy evaluation and treatment for right proximal hamstring pain.  She presents with tenderness to palpation at right ischial tuberosity, decreased hamstring flexibility and decreased overall function.  She would benefit  from eccentric strengthening, hamstring stretching, cross friction massage, possibly dry needling to elongate the hamstring, and modalities for pain relief.   OBJECTIVE IMPAIRMENTS: Abnormal gait, difficulty walking, decreased ROM, decreased strength, increased fascial restrictions, increased muscle spasms, impaired flexibility, and pain.   ACTIVITY LIMITATIONS: lifting, bending, standing, squatting, sleeping, stairs, transfers, bed mobility, and locomotion level  PARTICIPATION LIMITATIONS: meal prep, cleaning, laundry, driving, shopping, community activity, occupation, yard work, and church  PERSONAL FACTORS: Fitness, Past/current experiences, Profession, and 1-2 comorbidities: Htn and obesity  are also affecting patient's functional outcome.   REHAB POTENTIAL: Good  CLINICAL DECISION MAKING: Stable/uncomplicated  EVALUATION COMPLEXITY: Low   GOALS: Goals reviewed with patient? Yes  SHORT TERM GOALS: Target date: 01/05/2023   Pain report to be no greater than 4/10  Baseline: Goal status: INITIAL  2.  Patient will be independent with initial HEP  Baseline:  Goal status: INITIAL   LONG TERM GOALS: Target date: 02/02/2023    Patient to report pain no greater than 2/10  Baseline:  Goal status: INITIAL  2.  Patient to be independent with advanced HEP  Baseline:  Goal status: INITIAL  3.  Patient to be able to fully participate in her  workout regimen with her trainer Baseline:  Goal status: INITIAL  4.  Patient to be able to walk 1/2 mile without pain Baseline:  Goal status: INITIAL  5.  Patient to be able to care for her disabled husband without need for frequent rest breaks due to hamstring or right knee pain. Baseline:  Goal status: INITIAL  6.  Patient to be able to stand or walk for at least 15 min without leg pain  Baseline:  Goal status: INITIAL   PLAN:  PT FREQUENCY: 1-2x/week  PT DURATION: 8 weeks  PLANNED INTERVENTIONS: Therapeutic exercises, Therapeutic activity, Neuromuscular re-education, Balance training, Gait training, Patient/Family education, Self Care, Joint mobilization, Stair training, Aquatic Therapy, Dry Needling, Electrical stimulation, Cryotherapy, Moist heat, Taping, Vasopneumatic device, Ultrasound, Ionotophoresis 4mg /ml Dexamethasone, Manual therapy, and Re-evaluation  PLAN FOR NEXT SESSION: Review HEP, Nustep, complete 5 times sit to stand and TUG, eccentric hamstring strengthening.   Rudi Heap PT, DPT 12/21/22  2:55 PM '

## 2022-12-22 ENCOUNTER — Ambulatory Visit: Payer: Medicare Other

## 2022-12-22 DIAGNOSIS — M79604 Pain in right leg: Secondary | ICD-10-CM | POA: Diagnosis not present

## 2022-12-22 DIAGNOSIS — R262 Difficulty in walking, not elsewhere classified: Secondary | ICD-10-CM | POA: Diagnosis not present

## 2022-12-22 DIAGNOSIS — M6281 Muscle weakness (generalized): Secondary | ICD-10-CM

## 2022-12-22 DIAGNOSIS — R252 Cramp and spasm: Secondary | ICD-10-CM

## 2022-12-22 NOTE — Therapy (Signed)
OUTPATIENT PHYSICAL THERAPY LOWER EXTREMITY TREATMENT NOTE   Patient Name: Virginia Matthews MRN: [id number] DOB:[birth year], 67 y.o., female Today's Date: 12/22/2022  END OF SESSION:  PT End of Session - 12/22/22 0933     Visit Number 2    Date for PT Re-Evaluation 02/02/23    Authorization Type Medicare A and B and BCBS supplemental    Authorization - Visit Number 2    Progress Note Due on Visit 10    PT Start Time 0933    PT Stop Time 1018    PT Time Calculation (min) 45 min    Activity Tolerance Patient tolerated treatment well    Behavior During Therapy [facility] for tasks assessed/performed             Past Medical History:  Diagnosis Date   Breast mass, left 06/2016   Carpal tunnel syndrome on both sides    GERD (gastroesophageal reflux disease)    Heart murmur    Hypertension    states under control with med., has been on med. x 3-4 yr.   LVH (left ventricular hypertrophy)    moderate, per echo 01/2016   Moderate aortic regurgitation 07/03/2020   Obesity    PONV (postoperative nausea and vomiting)    Psoriatic arthritis ([place])    Rosacea    Thoracic ascending aortic aneurysm [facility])    Past Surgical History:  Procedure Laterality Date   BREAST EXCISIONAL BIOPSY Left    benign   COLONOSCOPY WITH PROPOFOL  09/12/2015   LAPAROSCOPIC ENDOMETRIOSIS FULGURATION     MICRODISCECTOMY LUMBAR Right 07/16/2011   L4-5   PELVIC LAPAROSCOPY     DIAG LAP   RADIOACTIVE SEED GUIDED EXCISIONAL BREAST BIOPSY Left 07/16/2016   Procedure: LEFT RADIOACTIVE SEED GUIDED EXCISIONAL BREAST BIOPSY;  Surgeon: Rolm Bookbinder, MD;  Location: [place];  Service: General;  Laterality: Left;   Patient Active Problem List   Diagnosis Date Noted   Hamstring tendonitis of right thigh 10/22/2022   Primary localized osteoarthrosis of multiple sites 05/25/2022   Moderate aortic regurgitation 07/03/2020   Right knee pain 07/01/2017   Osteoarthritis of knees, bilateral 04/13/2017    Psoriatic arthritis ([place]) 04/17/2016   LVH (left ventricular hypertrophy) 02/11/2016   Ascending aortic aneurysm ([place]) 02/11/2016   Carotid bruit 12/31/2015   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 0000000   Left wrist pain 07/05/2015   Bilateral hip pain 07/05/2015   Leg size inequality 07/05/2015   Right facial numbness 12/21/2013   Anxiety and depression 05/13/2012   Physical exam, routine 05/13/2012   GERD (gastroesophageal reflux disease) 05/13/2012   Seasonal allergies    PCO (polycystic ovaries)    Ruptured lumbar disc    Endometriosis    HYPERTENSION, BENIGN ESSENTIAL 05/01/2010   NONSPEC ELEVATION OF LEVELS OF TRANSAMINASE/LDH 03/14/2009   Severe obesity (BMI >= 40) ([place]) 02/11/2009   RHINITIS 02/11/2009   ABDOMINAL PAIN, UNSPECIFIED SITE 02/11/2009    PCP: Midge Minium, MD   REFERRING PROVIDER: Thurman Coyer, DO  REFERRING DIAG: [phone] (ICD-10-CM) - Hamstring tendonitis of right thigh  THERAPY DIAG:  Pain in right leg  Difficulty in walking, not elsewhere classified  Muscle weakness (generalized)  Cramp and spasm  Rationale for Evaluation and Treatment: Rehabilitation  ONSET DATE: 12/03/2022  SUBJECTIVE:   SUBJECTIVE STATEMENT: Patient reports she wasn't able to come last week due to out of town guests.  Pain is about the same and her right shoulder has become  bothersome.     PERTINENT HISTORY: MD appt with Dr. Micheline Chapman on 12/02/21: Patient's knee is injected with cortisone today. An anterior lateral approach is utilized. She tolerates this without difficulty. I think she may benefit from a custom medial unloader brace from [employer]. We will put her in contact with our brace representative. Of note, she is also continue to struggle with some proximal right hamstring tendinopathy. I recommended that we try formal physical therapy. She will follow-up with me as needed.  PAIN:  Are you having pain?  0/10 at best and  7/10 at worst  PRECAUTIONS: None  WEIGHT BEARING RESTRICTIONS: No  FALLS:  Has patient fallen in last 6 months? No  LIVING ENVIRONMENT: Lives with: lives with their spouse Lives in: House/apartment Stairs: No   OCCUPATION: retired  PLOF: Independent, Independent with basic ADLs, Independent with household mobility without device, Independent with community mobility without device, Independent with homemaking with ambulation, Independent with gait, and Independent with transfers  PATIENT GOALS: to reduce pain and to be able to exercise without exacerbation of pain  NEXT MD VISIT: prn  OBJECTIVE:   DIAGNOSTIC FINDINGS: na  PATIENT SURVEYS:  FOTO 57, goal is 64  COGNITION: Overall cognitive status: Within functional limits for tasks assessed     SENSATION: WFL   MUSCLE LENGTH: Hamstrings: Right 45 deg   POSTURE:  right knee flexed on stance phase  PALPATION: Tender to palpation at right ischial tuberosity  LOWER EXTREMITY ROM:   LOWER EXTREMITY MMT:  Generally 4 to 4+/5,  right hamstring not tested due to current strain  FUNCTIONAL TESTS:  5 times sit to stand: 10.70 sec Timed up and go (TUG): 8.98 sec  GAIT: Distance walked: 30 feet Assistive device utilized: None Level of assistance: Complete Independence Comments: antalgic   TODAY'S TREATMENT:                                                                                                                              DATE: 12/22/22 Recumbent bike x 5 min Reviewed HEP Added quad stretches and IT band stretches bilaterally 3 x 30 sec Hook lying PPT x 20 Hook lying PPT with 90\90 heel taps x 20 Educated on foam roller use for hamstring mobilization at the ischial tuberosity: suggested 2-3 min with a firm roller since she would be using it on her bed.  Ice to right hamstring x 10 min post treatment seated in chair.  DATE: 12/08/22 Initial eval completed and initiated HEP    PATIENT EDUCATION:   Education details: Initiated HEP Person educated: Patient Education method: Consulting civil engineer, Media planner, Verbal cues, and Handouts Education comprehension: verbalized understanding, returned demonstration, and verbal cues required  HOME EXERCISE PROGRAM: Access Code: [email or web address] URL: [email or web address] Date: 12/08/2022 Prepared by: Candyce Churn  Exercises - Standing Hamstring Stretch with Step  - 1 x daily - 7 x weekly - 3 sets - 10 reps - Seated Hamstring Stretch  - 1 x daily -  7 x weekly - 3 sets - 10 reps - Prone Hamstring Curl with Ankle Weight  - 1 x daily - 7 x weekly - 3 sets - 10 reps - Standing Hamstring Curl with Chair Support  - 1 x daily - 7 x weekly - 3 sets - 10 reps - Hamstring Mobilization with Foam Roll  - 1 x daily - 7 x weekly - 1 sets - 1 reps - 1 min hold  ASSESSMENT:  CLINICAL IMPRESSION: Tacoya was able to return demonstration of all HEP with some need for verbal corrections.  She was having some right shoulder pain during session today but completed all tasks.  She is well motivated and compliant.  She should continue to do well.   She would benefit from eccentric strengthening, hamstring stretching, cross friction massage, possibly dry needling to elongate the hamstring, and modalities for pain relief.   OBJECTIVE IMPAIRMENTS: Abnormal gait, difficulty walking, decreased ROM, decreased strength, increased fascial restrictions, increased muscle spasms, impaired flexibility, and pain.   ACTIVITY LIMITATIONS: lifting, bending, standing, squatting, sleeping, stairs, transfers, bed mobility, and locomotion level  PARTICIPATION LIMITATIONS: meal prep, cleaning, laundry, driving, shopping, community activity, occupation, yard work, and church  PERSONAL FACTORS: Fitness, Past/current experiences, Profession, and 1-2 comorbidities: Htn and obesity  are also affecting patient's functional outcome.   REHAB POTENTIAL: Good  CLINICAL DECISION MAKING:  Stable/uncomplicated  EVALUATION COMPLEXITY: Low   GOALS: Goals reviewed with patient? Yes  SHORT TERM GOALS: Target date: 01/05/2023   Pain report to be no greater than 4/10  Baseline: Goal status: INITIAL  2.  Patient will be independent with initial HEP  Baseline:  Goal status: INITIAL   LONG TERM GOALS: Target date: 02/02/2023    Patient to report pain no greater than 2/10  Baseline:  Goal status: INITIAL  2.  Patient to be independent with advanced HEP  Baseline:  Goal status: INITIAL  3.  Patient to be able to fully participate in her workout regimen with her trainer Baseline:  Goal status: INITIAL  4.  Patient to be able to walk 1/2 mile without pain Baseline:  Goal status: INITIAL  5.  Patient to be able to care for her disabled husband without need for frequent rest breaks due to hamstring or right knee pain. Baseline:  Goal status: INITIAL  6.  Patient to be able to stand or walk for at least 15 min without leg pain  Baseline:  Goal status: INITIAL   PLAN:  PT FREQUENCY: 1-2x/week  PT DURATION: 8 weeks  PLANNED INTERVENTIONS: Therapeutic exercises, Therapeutic activity, Neuromuscular re-education, Balance training, Gait training, Patient/Family education, Self Care, Joint mobilization, Stair training, Aquatic Therapy, Dry Needling, Electrical stimulation, Cryotherapy, Moist heat, Taping, Vasopneumatic device, Ultrasound, Ionotophoresis 4mg /ml Dexamethasone, Manual therapy, and Re-evaluation  PLAN FOR NEXT SESSION: Progress HEP, Nustep, flexibility, possibly DN, eccentric hamstring strengthening.    Anderson Malta B. Anaise Sterbenz, PT 12/22/22 10:17 AM  [facility] [address], [place], [place] [address] Phone # [phone] Fax [phone]

## 2022-12-24 ENCOUNTER — Ambulatory Visit: Payer: Medicare Other

## 2022-12-24 DIAGNOSIS — M79604 Pain in right leg: Secondary | ICD-10-CM | POA: Diagnosis not present

## 2022-12-24 DIAGNOSIS — R262 Difficulty in walking, not elsewhere classified: Secondary | ICD-10-CM | POA: Diagnosis not present

## 2022-12-24 DIAGNOSIS — R252 Cramp and spasm: Secondary | ICD-10-CM

## 2022-12-24 DIAGNOSIS — M6281 Muscle weakness (generalized): Secondary | ICD-10-CM

## 2022-12-24 NOTE — Therapy (Signed)
OUTPATIENT PHYSICAL THERAPY LOWER EXTREMITY TREATMENT NOTE   Patient Name: Virginia Matthews MRN: [id number] DOB:[birth year], 67 y.o., female Today's Date: 12/24/2022  END OF SESSION:  PT End of Session - 12/24/22 1631     Visit Number 3    Date for PT Re-Evaluation 02/02/23    Authorization Type Medicare A and B and BCBS supplemental    Progress Note Due on Visit 10    PT Start Time 1619    PT Stop Time 1707    PT Time Calculation (min) 48 min    Activity Tolerance Patient tolerated treatment well    Behavior During Therapy [facility] for tasks assessed/performed             Past Medical History:  Diagnosis Date   Breast mass, left 06/2016   Carpal tunnel syndrome on both sides    GERD (gastroesophageal reflux disease)    Heart murmur    Hypertension    states under control with med., has been on med. x 3-4 yr.   LVH (left ventricular hypertrophy)    moderate, per echo 01/2016   Moderate aortic regurgitation 07/03/2020   Obesity    PONV (postoperative nausea and vomiting)    Psoriatic arthritis ([place])    Rosacea    Thoracic ascending aortic aneurysm [facility])    Past Surgical History:  Procedure Laterality Date   BREAST EXCISIONAL BIOPSY Left    benign   COLONOSCOPY WITH PROPOFOL  09/12/2015   LAPAROSCOPIC ENDOMETRIOSIS FULGURATION     MICRODISCECTOMY LUMBAR Right 07/16/2011   L4-5   PELVIC LAPAROSCOPY     DIAG LAP   RADIOACTIVE SEED GUIDED EXCISIONAL BREAST BIOPSY Left 07/16/2016   Procedure: LEFT RADIOACTIVE SEED GUIDED EXCISIONAL BREAST BIOPSY;  Surgeon: Rolm Bookbinder, MD;  Location: [place];  Service: General;  Laterality: Left;   Patient Active Problem List   Diagnosis Date Noted   Hamstring tendonitis of right thigh 10/22/2022   Primary localized osteoarthrosis of multiple sites 05/25/2022   Moderate aortic regurgitation 07/03/2020   Right knee pain 07/01/2017   Osteoarthritis of knees, bilateral 04/13/2017   Psoriatic arthritis ([place])  04/17/2016   LVH (left ventricular hypertrophy) 02/11/2016   Ascending aortic aneurysm ([place]) 02/11/2016   Carotid bruit 12/31/2015   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 0000000   Left wrist pain 07/05/2015   Bilateral hip pain 07/05/2015   Leg size inequality 07/05/2015   Right facial numbness 12/21/2013   Anxiety and depression 05/13/2012   Physical exam, routine 05/13/2012   GERD (gastroesophageal reflux disease) 05/13/2012   Seasonal allergies    PCO (polycystic ovaries)    Ruptured lumbar disc    Endometriosis    HYPERTENSION, BENIGN ESSENTIAL 05/01/2010   NONSPEC ELEVATION OF LEVELS OF TRANSAMINASE/LDH 03/14/2009   Severe obesity (BMI >= 40) ([place]) 02/11/2009   RHINITIS 02/11/2009   ABDOMINAL PAIN, UNSPECIFIED SITE 02/11/2009    PCP: Midge Minium, MD   REFERRING PROVIDER: Thurman Coyer, DO  REFERRING DIAG: [phone] (ICD-10-CM) - Hamstring tendonitis of right thigh  THERAPY DIAG:  Pain in right leg  Difficulty in walking, not elsewhere classified  Muscle weakness (generalized)  Cramp and spasm  Rationale for Evaluation and Treatment: Rehabilitation  ONSET DATE: 12/03/2022  SUBJECTIVE:   SUBJECTIVE STATEMENT: Patient reports the legs are feeling pretty good but her right shoulder is really hurting.    PERTINENT HISTORY: MD appt with Dr. Micheline Chapman on 12/02/21: Patient's knee is injected with cortisone today. An  anterior lateral approach is utilized. She tolerates this without difficulty. I think she may benefit from a custom medial unloader brace from [employer]. We will put her in contact with our brace representative. Of note, she is also continue to struggle with some proximal right hamstring tendinopathy. I recommended that we try formal physical therapy. She will follow-up with me as needed.  PAIN:  Are you having pain?  0/10 at best and 7/10 at worst  PRECAUTIONS: None  WEIGHT BEARING RESTRICTIONS: No  FALLS:  Has  patient fallen in last 6 months? No  LIVING ENVIRONMENT: Lives with: lives with their spouse Lives in: House/apartment Stairs: No   OCCUPATION: retired  PLOF: Independent, Independent with basic ADLs, Independent with household mobility without device, Independent with community mobility without device, Independent with homemaking with ambulation, Independent with gait, and Independent with transfers  PATIENT GOALS: to reduce pain and to be able to exercise without exacerbation of pain  NEXT MD VISIT: prn  OBJECTIVE:   DIAGNOSTIC FINDINGS: na  PATIENT SURVEYS:  FOTO 57, goal is 96  COGNITION: Overall cognitive status: Within functional limits for tasks assessed     SENSATION: WFL   MUSCLE LENGTH: Hamstrings: Right 45 deg   POSTURE:  right knee flexed on stance phase  PALPATION: Tender to palpation at right ischial tuberosity  LOWER EXTREMITY ROM:   LOWER EXTREMITY MMT:  Generally 4 to 4+/5,  right hamstring not tested due to current strain  FUNCTIONAL TESTS:  5 times sit to stand: 10.70 sec Timed up and go (TUG): 8.98 sec  GAIT: Distance walked: 30 feet Assistive device utilized: None Level of assistance: Complete Independence Comments: antalgic   TODAY'S TREATMENT:                                                                                                                              DATE: 12/24/22 Nustep x 5 min Hamstring curl with slider x 20 each LE focusing on pressure into heel as she slides heel back Seated piriformis stretch Sit to stand x 5 Squats to table x 5 Eccentric hamstring lowering x 10 each LE with 0#, the x 10 with 2.5# Hip extension x 20 with 2.5# Hip abduction x 20 with 2.5# Ice to right hamstring x 10 min post treatment seated in chair. Messaged Dr. Micheline Chapman and Dr. Barbaraann Barthel re: patients shoulder issue  DATE: 12/22/22 Recumbent bike x 5 min Reviewed HEP Added quad stretches and IT band stretches bilaterally 3 x 30 sec Hook  lying PPT x 20 Hook lying PPT with 90\90 heel taps x 20 Educated on foam roller use for hamstring mobilization at the ischial tuberosity: suggested 2-3 min with a firm roller since she would be using it on her bed.  Ice to right hamstring x 10 min post treatment seated in chair.  DATE: 12/08/22 Initial eval completed and initiated HEP    PATIENT EDUCATION:  Education details: Initiated HEP Person educated: Patient Education  method: Explanation, Demonstration, Verbal cues, and Handouts Education comprehension: verbalized understanding, returned demonstration, and verbal cues required  HOME EXERCISE PROGRAM: Access Code: [email or web address] URL: [email or web address] Date: 12/08/2022 Prepared by: Candyce Churn  Exercises - Standing Hamstring Stretch with Step  - 1 x daily - 7 x weekly - 3 sets - 10 reps - Seated Hamstring Stretch  - 1 x daily - 7 x weekly - 3 sets - 10 reps - Prone Hamstring Curl with Ankle Weight  - 1 x daily - 7 x weekly - 3 sets - 10 reps - Standing Hamstring Curl with Chair Support  - 1 x daily - 7 x weekly - 3 sets - 10 reps - Hamstring Mobilization with Foam Roll  - 1 x daily - 7 x weekly - 1 sets - 1 reps - 1 min hold  ASSESSMENT:  CLINICAL IMPRESSION: Marilynn was able to complete all tasks today without pain.  She tolerated increased resistance on hamstring exercises as well. Her right shoulder is becoming her primary issue.  Messaged Dr. Micheline Chapman and Dr. Barbaraann Barthel re: patients shoulder issue to see if she needed a follow up and if they would be agreeable to [place] evaluating her shoulder.    She would benefit from eccentric strengthening, hamstring stretching, cross friction massage, possibly dry needling to elongate the hamstring, and modalities for pain relief.   OBJECTIVE IMPAIRMENTS: Abnormal gait, difficulty walking, decreased ROM, decreased strength, increased fascial restrictions, increased muscle spasms, impaired flexibility, and pain.   ACTIVITY  LIMITATIONS: lifting, bending, standing, squatting, sleeping, stairs, transfers, bed mobility, and locomotion level  PARTICIPATION LIMITATIONS: meal prep, cleaning, laundry, driving, shopping, community activity, occupation, yard work, and church  PERSONAL FACTORS: Fitness, Past/current experiences, Profession, and 1-2 comorbidities: Htn and obesity  are also affecting patient's functional outcome.   REHAB POTENTIAL: Good  CLINICAL DECISION MAKING: Stable/uncomplicated  EVALUATION COMPLEXITY: Low   GOALS: Goals reviewed with patient? Yes  SHORT TERM GOALS: Target date: 01/05/2023   Pain report to be no greater than 4/10  Baseline: Goal status: IN PROGRESS  2.  Patient will be independent with initial HEP  Baseline:  Goal status: IN PROGRESS   LONG TERM GOALS: Target date: 02/02/2023    Patient to report pain no greater than 2/10  Baseline:  Goal status: INITIAL  2.  Patient to be independent with advanced HEP  Baseline:  Goal status: INITIAL  3.  Patient to be able to fully participate in her workout regimen with her trainer Baseline:  Goal status: INITIAL  4.  Patient to be able to walk 1/2 mile without pain Baseline:  Goal status: INITIAL  5.  Patient to be able to care for her disabled husband without need for frequent rest breaks due to hamstring or right knee pain. Baseline:  Goal status: INITIAL  6.  Patient to be able to stand or walk for at least 15 min without leg pain  Baseline:  Goal status: INITIAL   PLAN:  PT FREQUENCY: 1-2x/week  PT DURATION: 8 weeks  PLANNED INTERVENTIONS: Therapeutic exercises, Therapeutic activity, Neuromuscular re-education, Balance training, Gait training, Patient/Family education, Self Care, Joint mobilization, Stair training, Aquatic Therapy, Dry Needling, Electrical stimulation, Cryotherapy, Moist heat, Taping, Vasopneumatic device, Ultrasound, Ionotophoresis 4mg /ml Dexamethasone, Manual therapy, and  Re-evaluation  PLAN FOR NEXT SESSION: Progress HEP, Nustep, flexibility, possibly DN, eccentric hamstring strengthening. Evaluate right shoulder if MD sends referral.    Anderson Malta B. Dru Laurel, PT 12/24/22 5:28 PM  [facility] [address]  [place], [place] [address] Phone # [phone] Fax [phone]

## 2022-12-29 ENCOUNTER — Ambulatory Visit: Payer: Medicare Other | Attending: Sports Medicine

## 2022-12-29 ENCOUNTER — Ambulatory Visit (HOSPITAL_BASED_OUTPATIENT_CLINIC_OR_DEPARTMENT_OTHER): Payer: Medicare Other | Admitting: Cardiovascular Disease

## 2022-12-29 DIAGNOSIS — M6281 Muscle weakness (generalized): Secondary | ICD-10-CM | POA: Insufficient documentation

## 2022-12-29 DIAGNOSIS — R262 Difficulty in walking, not elsewhere classified: Secondary | ICD-10-CM | POA: Diagnosis not present

## 2022-12-29 DIAGNOSIS — R252 Cramp and spasm: Secondary | ICD-10-CM | POA: Insufficient documentation

## 2022-12-29 DIAGNOSIS — M79604 Pain in right leg: Secondary | ICD-10-CM

## 2022-12-29 DIAGNOSIS — M25511 Pain in right shoulder: Secondary | ICD-10-CM | POA: Insufficient documentation

## 2022-12-29 DIAGNOSIS — M25611 Stiffness of right shoulder, not elsewhere classified: Secondary | ICD-10-CM | POA: Diagnosis not present

## 2022-12-29 NOTE — Therapy (Signed)
OUTPATIENT PHYSICAL THERAPY LOWER EXTREMITY TREATMENT NOTE   Patient Name: Virginia Matthews MRN: [id number] DOB:Dec 31, 1955, 67 y.o., female Today's Date: 12/29/2022  END OF SESSION:  PT End of Session - 12/29/22 1642     Visit Number 4    Date for PT Re-Evaluation 02/02/23    Authorization Type Medicare A and B and BCBS supplemental    Progress Note Due on Visit 10    PT Start Time 1624    PT Stop Time 1705    PT Time Calculation (min) 41 min    Activity Tolerance Patient tolerated treatment well    Behavior During Therapy WFL for tasks assessed/performed             Past Medical History:  Diagnosis Date   Breast mass, left 06/2016   Carpal tunnel syndrome on both sides    GERD (gastroesophageal reflux disease)    Heart murmur    Hypertension    states under control with med., has been on med. x 3-4 yr.   LVH (left ventricular hypertrophy)    moderate, per echo 01/2016   Moderate aortic regurgitation 07/03/2020   Obesity    PONV (postoperative nausea and vomiting)    Psoriatic arthritis    Rosacea    Thoracic ascending aortic aneurysm    Past Surgical History:  Procedure Laterality Date   BREAST EXCISIONAL BIOPSY Left    benign   COLONOSCOPY WITH PROPOFOL  09/12/2015   LAPAROSCOPIC ENDOMETRIOSIS FULGURATION     MICRODISCECTOMY LUMBAR Right 07/16/2011   L4-5   PELVIC LAPAROSCOPY     DIAG LAP   RADIOACTIVE SEED GUIDED EXCISIONAL BREAST BIOPSY Left 07/16/2016   Procedure: LEFT RADIOACTIVE SEED GUIDED EXCISIONAL BREAST BIOPSY;  Surgeon: Rolm Bookbinder, MD;  Location: [place];  Service: General;  Laterality: Left;   Patient Active Problem List   Diagnosis Date Noted   Hamstring tendonitis of right thigh 10/22/2022   Primary localized osteoarthrosis of multiple sites 05/25/2022   Moderate aortic regurgitation 07/03/2020   Right knee pain 07/01/2017   Osteoarthritis of knees, bilateral 04/13/2017   Psoriatic arthritis 04/17/2016   LVH (left  ventricular hypertrophy) 02/11/2016   Ascending aortic aneurysm 02/11/2016   Carotid bruit 12/31/2015   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 0000000   Left wrist pain 07/05/2015   Bilateral hip pain 07/05/2015   Leg size inequality 07/05/2015   Right facial numbness 12/21/2013   Anxiety and depression 05/13/2012   Physical exam, routine 05/13/2012   GERD (gastroesophageal reflux disease) 05/13/2012   Seasonal allergies    PCO (polycystic ovaries)    Ruptured lumbar disc    Endometriosis    HYPERTENSION, BENIGN ESSENTIAL 05/01/2010   NONSPEC ELEVATION OF LEVELS OF TRANSAMINASE/LDH 03/14/2009   Severe obesity (BMI >= 40) 02/11/2009   RHINITIS 02/11/2009   ABDOMINAL PAIN, UNSPECIFIED SITE 02/11/2009    PCP: Midge Minium, MD   REFERRING PROVIDER: Thurman Coyer, DO  REFERRING DIAG: [phone] (ICD-10-CM) - Hamstring tendonitis of right thigh  THERAPY DIAG:  Pain in right leg  Difficulty in walking, not elsewhere classified  Muscle weakness (generalized)  Cramp and spasm  Rationale for Evaluation and Treatment: Rehabilitation  ONSET DATE: 12/03/2022  SUBJECTIVE:   SUBJECTIVE STATEMENT: Patient reports she is about 25% better with regard to her right leg.  "The shoulder still hurts"   PERTINENT HISTORY: MD appt with Dr. Micheline Chapman on 12/02/21: Patient's knee is injected with cortisone today. An anterior lateral approach  is utilized. She tolerates this without difficulty. I think she may benefit from a custom medial unloader brace from [employer]. We will put her in contact with our brace representative. Of note, she is also continue to struggle with some proximal right hamstring tendinopathy. I recommended that we try formal physical therapy. She will follow-up with me as needed.  PAIN:  Are you having pain?  0/10 at best and 7/10 at worst  PRECAUTIONS: None  WEIGHT BEARING RESTRICTIONS: No  FALLS:  Has patient fallen in last 6  months? No  LIVING ENVIRONMENT: Lives with: lives with their spouse Lives in: House/apartment Stairs: No   OCCUPATION: retired  PLOF: Independent, Independent with basic ADLs, Independent with household mobility without device, Independent with community mobility without device, Independent with homemaking with ambulation, Independent with gait, and Independent with transfers  PATIENT GOALS: to reduce pain and to be able to exercise without exacerbation of pain  NEXT MD VISIT: prn  OBJECTIVE:   DIAGNOSTIC FINDINGS: na  PATIENT SURVEYS:  FOTO 57, goal is 61  COGNITION: Overall cognitive status: Within functional limits for tasks assessed     SENSATION: WFL   MUSCLE LENGTH: Hamstrings: Right 45 deg   POSTURE:  right knee flexed on stance phase  PALPATION: Tender to palpation at right ischial tuberosity  LOWER EXTREMITY ROM:   LOWER EXTREMITY MMT:  Generally 4 to 4+/5,  right hamstring not tested due to current strain  FUNCTIONAL TESTS:  5 times sit to stand: 10.70 sec Timed up and go (TUG): 8.98 sec  GAIT: Distance walked: 30 feet Assistive device utilized: None Level of assistance: Complete Independence Comments: antalgic   TODAY'S TREATMENT:                                                                                                                              DATE: 12/29/22 Nustep x 5 min Sit to stand x 5 Squats to table x 5 Eccentric hamstring lowering x 20 each LE with 5 lbs Leg press x 20 with 100 lbs both Seated piriformis stretch x 3 holding 30 sec Seated LAQ with instruction to remain in terminal range where she had no shearing pain 5 lbs x 20 Ice to right hamstring and right knee x 10 min post treatment seated in chair. Re-sent message to Dr. Micheline Chapman and Dr. Barbaraann Barthel re: patients shoulder issue  DATE: 12/24/22 Nustep x 5 min Hamstring curl with slider x 20 each LE focusing on pressure into heel as she slides heel back Seated piriformis  stretch Sit to stand x 5 Squats to table x 5 Eccentric hamstring lowering x 10 each LE with 0#, then x 10 with 2.5# Hip extension x 20 with 2.5# Hip abduction x 20 with 2.5# Ice to right hamstring x 10 min post treatment seated in chair. Messaged Dr. Micheline Chapman and Dr. Barbaraann Barthel re: patients shoulder issue  DATE: 12/22/22 Recumbent bike x 5 min Reviewed HEP Added quad stretches and  IT band stretches bilaterally 3 x 30 sec Hook lying PPT x 20 Hook lying PPT with 90\90 heel taps x 20 Educated on foam roller use for hamstring mobilization at the ischial tuberosity: suggested 2-3 min with a firm roller since she would be using it on her bed.  Ice to right hamstring x 10 min post treatment seated in chair.  DATE: 12/08/22 Initial eval completed and initiated HEP    PATIENT EDUCATION:  Education details: Initiated HEP Person educated: Patient Education method: Consulting civil engineer, Media planner, Verbal cues, and Handouts Education comprehension: verbalized understanding, returned demonstration, and verbal cues required  HOME EXERCISE PROGRAM: Access Code: [email or web address] URL: [email or web address] Date: 12/08/2022 Prepared by: Candyce Churn  Exercises - Standing Hamstring Stretch with Step  - 1 x daily - 7 x weekly - 3 sets - 10 reps - Seated Hamstring Stretch  - 1 x daily - 7 x weekly - 3 sets - 10 reps - Prone Hamstring Curl with Ankle Weight  - 1 x daily - 7 x weekly - 3 sets - 10 reps - Standing Hamstring Curl with Chair Support  - 1 x daily - 7 x weekly - 3 sets - 10 reps - Hamstring Mobilization with Foam Roll  - 1 x daily - 7 x weekly - 1 sets - 1 reps - 1 min hold  ASSESSMENT:  CLINICAL IMPRESSION: Virginia Matthews is progressing appropriately.  She has very little difficulty doing all tasks today but did fatigue with LAQ with the 5 lb weight.  We resent message to Dr. Micheline Chapman and Dr. Barbaraann Barthel about her shoulder as it was sent via quick message vs normal messaging and response may have  expired.  We will move fwd with right shoulder eval if MD sends new Rx or addends current Rx.     She would benefit from eccentric strengthening, hamstring stretching, cross friction massage, possibly dry needling to elongate the hamstring, and modalities for pain relief.   OBJECTIVE IMPAIRMENTS: Abnormal gait, difficulty walking, decreased ROM, decreased strength, increased fascial restrictions, increased muscle spasms, impaired flexibility, and pain.   ACTIVITY LIMITATIONS: lifting, bending, standing, squatting, sleeping, stairs, transfers, bed mobility, and locomotion level  PARTICIPATION LIMITATIONS: meal prep, cleaning, laundry, driving, shopping, community activity, occupation, yard work, and church  PERSONAL FACTORS: Fitness, Past/current experiences, Profession, and 1-2 comorbidities: Htn and obesity  are also affecting patient's functional outcome.   REHAB POTENTIAL: Good  CLINICAL DECISION MAKING: Stable/uncomplicated  EVALUATION COMPLEXITY: Low   GOALS: Goals reviewed with patient? Yes  SHORT TERM GOALS: Target date: 01/05/2023   Pain report to be no greater than 4/10  Baseline: Goal status: IN PROGRESS  2.  Patient will be independent with initial HEP  Baseline:  Goal status: MET   LONG TERM GOALS: Target date: 02/02/2023    Patient to report pain no greater than 2/10  Baseline:  Goal status: INITIAL  2.  Patient to be independent with advanced HEP  Baseline:  Goal status: INITIAL  3.  Patient to be able to fully participate in her workout regimen with her trainer Baseline:  Goal status: INITIAL  4.  Patient to be able to walk 1/2 mile without pain Baseline:  Goal status: INITIAL  5.  Patient to be able to care for her disabled husband without need for frequent rest breaks due to hamstring or right knee pain. Baseline:  Goal status: INITIAL  6.  Patient to be able to stand or walk for at least 15 min without leg pain  Baseline:  Goal status:  INITIAL   PLAN:  PT FREQUENCY: 1-2x/week  PT DURATION: 8 weeks  PLANNED INTERVENTIONS: Therapeutic exercises, Therapeutic activity, Neuromuscular re-education, Balance training, Gait training, Patient/Family education, Self Care, Joint mobilization, Stair training, Aquatic Therapy, Dry Needling, Electrical stimulation, Cryotherapy, Moist heat, Taping, Vasopneumatic device, Ultrasound, Ionotophoresis 4mg /ml Dexamethasone, Manual therapy, and Re-evaluation  PLAN FOR NEXT SESSION: Progress HEP, Nustep, flexibility, possibly DN, eccentric hamstring strengthening. Evaluate right shoulder if MD sends referral.    Anderson Malta B. Zamantha Strebel, PT 12/29/22 5:14 PM  [facility] [address] [place], [place] [address] Phone # [phone] Fax [phone]

## 2022-12-31 ENCOUNTER — Ambulatory Visit: Payer: Medicare Other

## 2022-12-31 DIAGNOSIS — R262 Difficulty in walking, not elsewhere classified: Secondary | ICD-10-CM | POA: Diagnosis not present

## 2022-12-31 DIAGNOSIS — M79604 Pain in right leg: Secondary | ICD-10-CM | POA: Diagnosis not present

## 2022-12-31 DIAGNOSIS — R252 Cramp and spasm: Secondary | ICD-10-CM | POA: Diagnosis not present

## 2022-12-31 DIAGNOSIS — M6281 Muscle weakness (generalized): Secondary | ICD-10-CM

## 2022-12-31 DIAGNOSIS — M25611 Stiffness of right shoulder, not elsewhere classified: Secondary | ICD-10-CM | POA: Diagnosis not present

## 2022-12-31 DIAGNOSIS — M25511 Pain in right shoulder: Secondary | ICD-10-CM | POA: Diagnosis not present

## 2022-12-31 NOTE — Therapy (Signed)
OUTPATIENT PHYSICAL THERAPY LOWER EXTREMITY TREATMENT NOTE   Patient Name: Virginia Matthews MRN: [id number] DOB:1955-10-04, 67 y.o., female Today's Date: 12/31/2022  END OF SESSION:  PT End of Session - 12/31/22 1119     Visit Number 5    Date for PT Re-Evaluation 02/02/23    Authorization Type Medicare A and B and BCBS supplemental    Progress Note Due on Visit 10    PT Start Time 1105    PT Stop Time 1153    PT Time Calculation (min) 48 min    Activity Tolerance Patient tolerated treatment well    Behavior During Therapy WFL for tasks assessed/performed             Past Medical History:  Diagnosis Date   Breast mass, left 06/2016   Carpal tunnel syndrome on both sides    GERD (gastroesophageal reflux disease)    Heart murmur    Hypertension    states under control with med., has been on med. x 3-4 yr.   LVH (left ventricular hypertrophy)    moderate, per echo 01/2016   Moderate aortic regurgitation 07/03/2020   Obesity    PONV (postoperative nausea and vomiting)    Psoriatic arthritis    Rosacea    Thoracic ascending aortic aneurysm    Past Surgical History:  Procedure Laterality Date   BREAST EXCISIONAL BIOPSY Left    benign   COLONOSCOPY WITH PROPOFOL  09/12/2015   LAPAROSCOPIC ENDOMETRIOSIS FULGURATION     MICRODISCECTOMY LUMBAR Right 07/16/2011   L4-5   PELVIC LAPAROSCOPY     DIAG LAP   RADIOACTIVE SEED GUIDED EXCISIONAL BREAST BIOPSY Left 07/16/2016   Procedure: LEFT RADIOACTIVE SEED GUIDED EXCISIONAL BREAST BIOPSY;  Surgeon: Rolm Bookbinder, MD;  Location: [place];  Service: General;  Laterality: Left;   Patient Active Problem List   Diagnosis Date Noted   Hamstring tendonitis of right thigh 10/22/2022   Primary localized osteoarthrosis of multiple sites 05/25/2022   Moderate aortic regurgitation 07/03/2020   Right knee pain 07/01/2017   Osteoarthritis of knees, bilateral 04/13/2017   Psoriatic arthritis 04/17/2016   LVH (left  ventricular hypertrophy) 02/11/2016   Ascending aortic aneurysm 02/11/2016   Carotid bruit 12/31/2015   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 0000000   Left wrist pain 07/05/2015   Bilateral hip pain 07/05/2015   Leg size inequality 07/05/2015   Right facial numbness 12/21/2013   Anxiety and depression 05/13/2012   Physical exam, routine 05/13/2012   GERD (gastroesophageal reflux disease) 05/13/2012   Seasonal allergies    PCO (polycystic ovaries)    Ruptured lumbar disc    Endometriosis    HYPERTENSION, BENIGN ESSENTIAL 05/01/2010   NONSPEC ELEVATION OF LEVELS OF TRANSAMINASE/LDH 03/14/2009   Severe obesity (BMI >= 40) 02/11/2009   RHINITIS 02/11/2009   ABDOMINAL PAIN, UNSPECIFIED SITE 02/11/2009    PCP: Midge Minium, MD   REFERRING PROVIDER: Thurman Coyer, DO  REFERRING DIAG: [phone] (ICD-10-CM) - Hamstring tendonitis of right thigh  THERAPY DIAG:  Pain in right leg  Difficulty in walking, not elsewhere classified  Muscle weakness (generalized)  Cramp and spasm  Rationale for Evaluation and Treatment: Rehabilitation  ONSET DATE: 12/03/2022  SUBJECTIVE:   SUBJECTIVE STATEMENT: Patient reports "doing ok"  No new issues.  Right knee painful when she started on recumbent bike.  PERTINENT HISTORY: MD appt with Dr. Micheline Chapman on 12/02/21: Patient's knee is injected with cortisone today. An anterior lateral approach is  utilized. She tolerates this without difficulty. I think she may benefit from a custom medial unloader brace from [employer]. We will put her in contact with our brace representative. Of note, she is also continue to struggle with some proximal right hamstring tendinopathy. I recommended that we try formal physical therapy. She will follow-up with me as needed.  PAIN:  Are you having pain?  0/10 at best and 7/10 at worst  PRECAUTIONS: None  WEIGHT BEARING RESTRICTIONS: No  FALLS:  Has patient fallen in last 6  months? No  LIVING ENVIRONMENT: Lives with: lives with their spouse Lives in: House/apartment Stairs: No   OCCUPATION: retired  PLOF: Independent, Independent with basic ADLs, Independent with household mobility without device, Independent with community mobility without device, Independent with homemaking with ambulation, Independent with gait, and Independent with transfers  PATIENT GOALS: to reduce pain and to be able to exercise without exacerbation of pain  NEXT MD VISIT: prn  OBJECTIVE:   DIAGNOSTIC FINDINGS: na  PATIENT SURVEYS:  FOTO 57, goal is 72  COGNITION: Overall cognitive status: Within functional limits for tasks assessed     SENSATION: WFL   MUSCLE LENGTH: Hamstrings: Right 45 deg   POSTURE:  right knee flexed on stance phase  PALPATION: Tender to palpation at right ischial tuberosity  LOWER EXTREMITY ROM:   LOWER EXTREMITY MMT:  Generally 4 to 4+/5,  right hamstring not tested due to current strain  FUNCTIONAL TESTS:  5 times sit to stand: 10.70 sec Timed up and go (TUG): 8.98 sec  GAIT: Distance walked: 30 feet Assistive device utilized: None Level of assistance: Complete Independence Comments: antalgic   TODAY'S TREATMENT:                                                                                                                              DATE: 12/31/22 Recumbent bike x 5 min Seated hamstring stretch x 3 holding 30 sec each Sit to stand x 10 Squats to table x 10 Leg press x 20 with 120 lbs both, then singles with 50 lbs x 20 each LE Eccentric hamstring lowering x 20 each LE with 5 lbs Standing hip abduction x 20 each LE with 5 lbs Seated LAQ with instruction to remain in terminal range where she had no shearing pain 5 lbs x 20 both Ice to right hamstring and bilateral knees x 10 min post treatment seated in chair. Re-sent message to Dr. Barbaraann Barthel re: patients shoulder issue  DATE: 12/29/22 Nustep x 5 min Sit to stand x  5 Squats to table x 5 Eccentric hamstring lowering x 20 each LE with 5 lbs Leg press x 20 with 100 lbs both Seated piriformis stretch x 3 holding 30 sec Seated LAQ with instruction to remain in terminal range where she had no shearing pain 5 lbs x 20 Ice to right hamstring and right knee x 10 min post treatment seated in chair. Re-sent message to Dr. Micheline Chapman  and Dr. Barbaraann Barthel re: patients shoulder issue  DATE: 12/24/22 Nustep x 5 min Hamstring curl with slider x 20 each LE focusing on pressure into heel as she slides heel back Seated piriformis stretch Sit to stand x 5 Squats to table x 5 Eccentric hamstring lowering x 10 each LE with 0#, then x 10 with 2.5# Hip extension x 20 with 2.5# Hip abduction x 20 with 2.5# Ice to right hamstring x 10 min post treatment seated in chair. Messaged Dr. Micheline Chapman and Dr. Barbaraann Barthel re: patients shoulder issue  DATE: 12/22/22 Recumbent bike x 5 min Reviewed HEP Added quad stretches and IT band stretches bilaterally 3 x 30 sec Hook lying PPT x 20 Hook lying PPT with 90\90 heel taps x 20 Educated on foam roller use for hamstring mobilization at the ischial tuberosity: suggested 2-3 min with a firm roller since she would be using it on her bed.  Ice to right hamstring x 10 min post treatment seated in chair.  DATE: 12/08/22 Initial eval completed and initiated HEP    PATIENT EDUCATION:  Education details: Initiated HEP Person educated: Patient Education method: Consulting civil engineer, Media planner, Verbal cues, and Handouts Education comprehension: verbalized understanding, returned demonstration, and verbal cues required  HOME EXERCISE PROGRAM: Access Code: [email or web address] URL: [email or web address] Date: 12/08/2022 Prepared by: Candyce Churn  Exercises - Standing Hamstring Stretch with Step  - 1 x daily - 7 x weekly - 3 sets - 10 reps - Seated Hamstring Stretch  - 1 x daily - 7 x weekly - 3 sets - 10 reps - Prone Hamstring Curl with Ankle Weight   - 1 x daily - 7 x weekly - 3 sets - 10 reps - Standing Hamstring Curl with Chair Support  - 1 x daily - 7 x weekly - 3 sets - 10 reps - Hamstring Mobilization with Foam Roll  - 1 x daily - 7 x weekly - 1 sets - 1 reps - 1 min hold  ASSESSMENT:  CLINICAL IMPRESSION: Pilar progresses through exercise sessions with some knee pain each visit.  She is struggling with right shoulder pain but feels she is tolerating the LE strengthening fairly well.  She completes all tasks with good form and endurance.      She would benefit from eccentric strengthening for hamstring, general bilateral LE strengthening, hamstring stretching, cross friction massage, possibly dry needling to elongate the hamstring, and modalities for pain relief.   OBJECTIVE IMPAIRMENTS: Abnormal gait, difficulty walking, decreased ROM, decreased strength, increased fascial restrictions, increased muscle spasms, impaired flexibility, and pain.   ACTIVITY LIMITATIONS: lifting, bending, standing, squatting, sleeping, stairs, transfers, bed mobility, and locomotion level  PARTICIPATION LIMITATIONS: meal prep, cleaning, laundry, driving, shopping, community activity, occupation, yard work, and church  PERSONAL FACTORS: Fitness, Past/current experiences, Profession, and 1-2 comorbidities: Htn and obesity  are also affecting patient's functional outcome.   REHAB POTENTIAL: Good  CLINICAL DECISION MAKING: Stable/uncomplicated  EVALUATION COMPLEXITY: Low   GOALS: Goals reviewed with patient? Yes  SHORT TERM GOALS: Target date: 01/05/2023   Pain report to be no greater than 4/10  Baseline: Goal status: IN PROGRESS  2.  Patient will be independent with initial HEP  Baseline:  Goal status: MET   LONG TERM GOALS: Target date: 02/02/2023    Patient to report pain no greater than 2/10  Baseline:  Goal status: INITIAL  2.  Patient to be independent with advanced HEP  Baseline:  Goal status: INITIAL  3.  Patient to be able to  fully participate in her workout regimen with her trainer Baseline:  Goal status: INITIAL  4.  Patient to be able to walk 1/2 mile without pain Baseline:  Goal status: INITIAL  5.  Patient to be able to care for her disabled husband without need for frequent rest breaks due to hamstring or right knee pain. Baseline:  Goal status: INITIAL  6.  Patient to be able to stand or walk for at least 15 min without leg pain  Baseline:  Goal status: INITIAL   PLAN:  PT FREQUENCY: 1-2x/week  PT DURATION: 8 weeks  PLANNED INTERVENTIONS: Therapeutic exercises, Therapeutic activity, Neuromuscular re-education, Balance training, Gait training, Patient/Family education, Self Care, Joint mobilization, Stair training, Aquatic Therapy, Dry Needling, Electrical stimulation, Cryotherapy, Moist heat, Taping, Vasopneumatic device, Ultrasound, Ionotophoresis 4mg /ml Dexamethasone, Manual therapy, and Re-evaluation  PLAN FOR NEXT SESSION: Progress HEP, Nustep, flexibility, possibly DN, eccentric hamstring strengthening. Evaluate right shoulder if MD sends referral.    Anderson Malta B. Ashtan Girtman, PT 12/31/22 2:00 PM  [facility] [address] [place], [place] [address] Phone # [phone] Fax [phone]

## 2023-01-04 ENCOUNTER — Ambulatory Visit (INDEPENDENT_AMBULATORY_CARE_PROVIDER_SITE_OTHER): Payer: Medicare Other | Admitting: Family Medicine

## 2023-01-04 VITALS — BP 124/86 | Ht 66.0 in | Wt 279.0 lb

## 2023-01-04 DIAGNOSIS — M25511 Pain in right shoulder: Secondary | ICD-10-CM

## 2023-01-04 MED ORDER — METHYLPREDNISOLONE ACETATE 40 MG/ML IJ SUSP
40.0000 mg | Freq: Once | INTRAMUSCULAR | Status: AC
  Administered 2023-01-04: 40 mg via INTRA_ARTICULAR

## 2023-01-04 NOTE — Patient Instructions (Signed)
You have rotator cuff impingement Try to avoid painful activities (overhead activities, lifting with extended arm) as much as possible. Can take tylenol in addition to this. Subacromial injection may be beneficial to help with pain and to decrease inflammation we repeated this today. Add physical therapy for this. Do home exercise program with theraband and scapular stabilization exercises on days you don't go to therapy. Consider MRI, nitro patches if not improving Follow up with me in 6 weeks but call me sooner if you're struggling. Have fun on your trip!

## 2023-01-04 NOTE — Progress Notes (Deleted)
PCP: Sheliah Hatch, MD  Subjective:   HPI: Patient is a 67 y.o. female here for ***.  ***  Past Medical History:  Diagnosis Date   Breast mass, left 06/2016   Carpal tunnel syndrome on both sides    GERD (gastroesophageal reflux disease)    Heart murmur    Hypertension    states under control with med., has been on med. x 3-4 yr.   LVH (left ventricular hypertrophy)    moderate, per echo 01/2016   Moderate aortic regurgitation 07/03/2020   Obesity    PONV (postoperative nausea and vomiting)    Psoriatic arthritis    Rosacea    Thoracic ascending aortic aneurysm     Current Outpatient Medications on File Prior to Visit  Medication Sig Dispense Refill   albuterol (VENTOLIN HFA) 108 (90 Base) MCG/ACT inhaler Inhale 2 puffs into the lungs every 6 hours as needed for wheezing or shortness of breath. 8.5 g 3   b complex vitamins tablet Take 1 tablet by mouth daily.     beclomethasone (QVAR REDIHALER) 80 MCG/ACT inhaler Inhale 1 puff into the lungs 2 (two) times daily. 1 each 3   calcipotriene-betamethasone (TACLONEX) ointment Apply topically daily.     escitalopram (LEXAPRO) 10 MG tablet TAKE 1 TABLET BY MOUTH ONCE DAILY 90 tablet 1   famotidine (PEPCID) 20 MG tablet Take 20 mg by mouth as needed for heartburn or indigestion.     fluticasone (FLONASE) 50 MCG/ACT nasal spray Place 2 sprays into both nostrils daily. 16 g 3   hydrocortisone valerate cream (WESTCORT) 0.2 %   3   inFLIXimab-axxq (AVSOLA) 100 MG injection Inject into the vein.     lisinopril (ZESTRIL) 20 MG tablet TAKE ONE TABLET BY MOUTH DAILY 90 tablet 2   loratadine (CLARITIN) 10 MG tablet Take 10 mg by mouth once.     Multiple Vitamins-Minerals (CENTRUM SILVER PO) Take 1 tablet by mouth daily. Use as directed     naproxen (NAPROSYN) 500 MG tablet Take 1 tablet (500 mg total) by mouth 2 (two) times daily as needed. Take with food. 60 tablet 0   Omega-3 Fatty Acids (FISH OIL PO) Take 1 capsule by mouth daily.      pantoprazole (PROTONIX) 40 MG tablet TAKE 1 TABLET BY MOUTH ONCE DAILY 90 tablet 1   triamcinolone ointment (KENALOG) 0.1 % Apply 1 Application topically 2 (two) times daily. 80 g 3   TURMERIC PO Take 1 capsule by mouth 2 (two) times daily.     VITAMIN D, CHOLECALCIFEROL, PO Take 2,000 Units by mouth daily.      No current facility-administered medications on file prior to visit.    Past Surgical History:  Procedure Laterality Date   BREAST EXCISIONAL BIOPSY Left    benign   COLONOSCOPY WITH PROPOFOL  09/12/2015   LAPAROSCOPIC ENDOMETRIOSIS FULGURATION     MICRODISCECTOMY LUMBAR Right 07/16/2011   L4-5   PELVIC LAPAROSCOPY     DIAG LAP   RADIOACTIVE SEED GUIDED EXCISIONAL BREAST BIOPSY Left 07/16/2016   Procedure: LEFT RADIOACTIVE SEED GUIDED EXCISIONAL BREAST BIOPSY;  Surgeon: Emelia Loron, MD;  Location: [facility];  Service: General;  Laterality: Left;    Allergies  Allergen Reactions   Adhesive [Tape] Other (See Comments)    SKIN IRRITATION    BP 124/86   Ht 5\' 6"  (1.676 m)   Wt 279 lb (126.6 kg)   BMI 45.03 kg/m      05/28/2020  1:57 PM 07/22/2021    2:56 PM  [facility] Adult Exercise  Frequency of aerobic exercise (# of days/week) 5 2  Average time in minutes 20 20  Frequency of strengthening activities (# of days/week) 0 2        No data to display              Objective:  Physical Exam:  Gen: NAD, comfortable in exam room  ***   Assessment & Plan:  1. ***

## 2023-01-05 ENCOUNTER — Encounter: Payer: Self-pay | Admitting: Family Medicine

## 2023-01-05 ENCOUNTER — Ambulatory Visit: Payer: Medicare Other

## 2023-01-05 DIAGNOSIS — R252 Cramp and spasm: Secondary | ICD-10-CM

## 2023-01-05 DIAGNOSIS — M25611 Stiffness of right shoulder, not elsewhere classified: Secondary | ICD-10-CM | POA: Diagnosis not present

## 2023-01-05 DIAGNOSIS — M6281 Muscle weakness (generalized): Secondary | ICD-10-CM

## 2023-01-05 DIAGNOSIS — M25511 Pain in right shoulder: Secondary | ICD-10-CM

## 2023-01-05 DIAGNOSIS — M79604 Pain in right leg: Secondary | ICD-10-CM

## 2023-01-05 DIAGNOSIS — R262 Difficulty in walking, not elsewhere classified: Secondary | ICD-10-CM

## 2023-01-05 NOTE — Progress Notes (Signed)
PCP: Sheliah Hatch, MD  Subjective:   HPI: Patient is a 67 y.o. female here for right shoulder pain.  11/15: She was previously evaluated here in May and June of this year for the same.  At that time she was found to have some subacromial bursal inflammation on ultrasound and was diagnosed with subacromial bursitis.  She took naproxen twice a day and did some home exercises.  It got better.  However, over the last several weeks the pain has returned.  She now has a constant dull ache in the anterior lateral part of the right shoulder that is worse with overhead movements.  It is also made worse with sleeping on it.  Feels better at rest.  She has stopped taking naproxen due to gastritis.  She has continued exercises focusing on the rotator cuff with her personal trainer but has not found those to be giving her any relief.  She denies any radiation of the pain, numbness or tingling in the arm.  12/20: Patient reports some improvement of her right shoulder pain compared to last visit. About 80% improved following subacromial injection, home exercises. Bothers her with overhead motions still.  4/8: Patient reports her right shoulder never completely improved. Worse reaching behind her back, reaching forward and overhead. Has been doing home exercises, working with a trainer for past 6 months and still struggling with this. Taking tylenol with minimal benefit. Hard to sleep on her right side. Unable to take NSAIDs due to stomach problems.  Past Medical History:  Diagnosis Date   Breast mass, left 06/2016   Carpal tunnel syndrome on both sides    GERD (gastroesophageal reflux disease)    Heart murmur    Hypertension    states under control with med., has been on med. x 3-4 yr.   LVH (left ventricular hypertrophy)    moderate, per echo 01/2016   Moderate aortic regurgitation 07/03/2020   Obesity    PONV (postoperative nausea and vomiting)    Psoriatic arthritis    Rosacea     Thoracic ascending aortic aneurysm     Current Outpatient Medications on File Prior to Visit  Medication Sig Dispense Refill   albuterol (VENTOLIN HFA) 108 (90 Base) MCG/ACT inhaler Inhale 2 puffs into the lungs every 6 hours as needed for wheezing or shortness of breath. 8.5 g 3   b complex vitamins tablet Take 1 tablet by mouth daily.     beclomethasone (QVAR REDIHALER) 80 MCG/ACT inhaler Inhale 1 puff into the lungs 2 (two) times daily. 1 each 3   calcipotriene-betamethasone (TACLONEX) ointment Apply topically daily.     escitalopram (LEXAPRO) 10 MG tablet TAKE 1 TABLET BY MOUTH ONCE DAILY 90 tablet 1   famotidine (PEPCID) 20 MG tablet Take 20 mg by mouth as needed for heartburn or indigestion.     fluticasone (FLONASE) 50 MCG/ACT nasal spray Place 2 sprays into both nostrils daily. 16 g 3   hydrocortisone valerate cream (WESTCORT) 0.2 %   3   inFLIXimab-axxq (AVSOLA) 100 MG injection Inject into the vein.     lisinopril (ZESTRIL) 20 MG tablet TAKE ONE TABLET BY MOUTH DAILY 90 tablet 2   loratadine (CLARITIN) 10 MG tablet Take 10 mg by mouth once.     Multiple Vitamins-Minerals (CENTRUM SILVER PO) Take 1 tablet by mouth daily. Use as directed     naproxen (NAPROSYN) 500 MG tablet Take 1 tablet (500 mg total) by mouth 2 (two) times daily as needed. Take with  food. 60 tablet 0   Omega-3 Fatty Acids (FISH OIL PO) Take 1 capsule by mouth daily.     pantoprazole (PROTONIX) 40 MG tablet TAKE 1 TABLET BY MOUTH ONCE DAILY 90 tablet 1   triamcinolone ointment (KENALOG) 0.1 % Apply 1 Application topically 2 (two) times daily. 80 g 3   TURMERIC PO Take 1 capsule by mouth 2 (two) times daily.     VITAMIN D, CHOLECALCIFEROL, PO Take 2,000 Units by mouth daily.      No current facility-administered medications on file prior to visit.    Past Surgical History:  Procedure Laterality Date   BREAST EXCISIONAL BIOPSY Left    benign   COLONOSCOPY WITH PROPOFOL  09/12/2015   LAPAROSCOPIC ENDOMETRIOSIS  FULGURATION     MICRODISCECTOMY LUMBAR Right 07/16/2011   L4-5   PELVIC LAPAROSCOPY     DIAG LAP   RADIOACTIVE SEED GUIDED EXCISIONAL BREAST BIOPSY Left 07/16/2016   Procedure: LEFT RADIOACTIVE SEED GUIDED EXCISIONAL BREAST BIOPSY;  Surgeon: Emelia Loron, MD;  Location: [facility];  Service: General;  Laterality: Left;    Allergies  Allergen Reactions   Adhesive [Tape] Other (See Comments)    SKIN IRRITATION    BP 124/86   Ht 5\' 6"  (1.676 m)   Wt 279 lb (126.6 kg)   BMI 45.03 kg/m      05/28/2020    1:57 PM 07/22/2021    2:56 PM  [facility] Adult Exercise  Frequency of aerobic exercise (# of days/week) 5 2  Average time in minutes 20 20  Frequency of strengthening activities (# of days/week) 0 2        No data to display              Objective:  Physical Exam:  Gen: NAD, comfortable in exam room  Right shoulder: No swelling, ecchymoses.  No gross deformity. No TTP. FROM with pain on external rotation painful arc. Negative Hawkins, Neers. Negative Yergasons. Strength 5/5 with empty can and resisted internal/external rotation. NV intact distally.   Assessment & Plan:  1. Right shoulder pain - 2/2 rotator cuff impingement, subacromial bursitis.  Worsened again.  Discussed options - going on a trip soon and would like to repeat injection so this was given today.  Consider nitro patches if not improving.  F/u in 6 weeks.  After informed written consent timeout was performed, patient was seated in chair in exam room. Right shoulder was prepped with alcohol swab and utilizing lateral approach with ultrasound guidance, patient's right subacromial space was injected with 3:1 lidocaine: depomedrol. Patient tolerated the procedure well without immediate complications.

## 2023-01-05 NOTE — Therapy (Signed)
OUTPATIENT PHYSICAL THERAPY LOWER EXTREMITY TREATMENT NOTE AND RIGHT SHOULDER EVALUATION   Patient Name: PALYN WEDEKIND MRN: [id number] DOB:09-14-1956, 67 y.o., female Today's Date: 01/05/2023  END OF SESSION:  PT End of Session - 01/05/23 1528     Visit Number 6    Date for PT Re-Evaluation 02/02/23    Authorization Type Medicare A and B and BCBS supplemental    Progress Note Due on Visit 10    PT Start Time 1528    PT Stop Time 1615    PT Time Calculation (min) 47 min    Activity Tolerance Patient tolerated treatment well    Behavior During Therapy [facility] for tasks assessed/performed             Past Medical History:  Diagnosis Date   Breast mass, left 06/2016   Carpal tunnel syndrome on both sides    GERD (gastroesophageal reflux disease)    Heart murmur    Hypertension    states under control with med., has been on med. x 3-4 yr.   LVH (left ventricular hypertrophy)    moderate, per echo 01/2016   Moderate aortic regurgitation 07/03/2020   Obesity    PONV (postoperative nausea and vomiting)    Psoriatic arthritis    Rosacea    Thoracic ascending aortic aneurysm    Past Surgical History:  Procedure Laterality Date   BREAST EXCISIONAL BIOPSY Left    benign   COLONOSCOPY WITH PROPOFOL  09/12/2015   LAPAROSCOPIC ENDOMETRIOSIS FULGURATION     MICRODISCECTOMY LUMBAR Right 07/16/2011   L4-5   PELVIC LAPAROSCOPY     DIAG LAP   RADIOACTIVE SEED GUIDED EXCISIONAL BREAST BIOPSY Left 07/16/2016   Procedure: LEFT RADIOACTIVE SEED GUIDED EXCISIONAL BREAST BIOPSY;  Surgeon: Emelia Loron, MD;  Location: [facility];  Service: General;  Laterality: Left;   Patient Active Problem List   Diagnosis Date Noted   Hamstring tendonitis of right thigh 10/22/2022   Primary localized osteoarthrosis of multiple sites 05/25/2022   Moderate aortic regurgitation 07/03/2020   Right knee pain 07/01/2017   Osteoarthritis of knees, bilateral 04/13/2017   Psoriatic  arthritis 04/17/2016   LVH (left ventricular hypertrophy) 02/11/2016   Ascending aortic aneurysm 02/11/2016   Carotid bruit 12/31/2015   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 09/10/2015   Left wrist pain 07/05/2015   Bilateral hip pain 07/05/2015   Leg size inequality 07/05/2015   Right facial numbness 12/21/2013   Anxiety and depression 05/13/2012   Physical exam, routine 05/13/2012   GERD (gastroesophageal reflux disease) 05/13/2012   Seasonal allergies    PCO (polycystic ovaries)    Ruptured lumbar disc    Endometriosis    HYPERTENSION, BENIGN ESSENTIAL 05/01/2010   NONSPEC ELEVATION OF LEVELS OF TRANSAMINASE/LDH 03/14/2009   Severe obesity (BMI >= 40) 02/11/2009   RHINITIS 02/11/2009   ABDOMINAL PAIN, UNSPECIFIED SITE 02/11/2009    PCP: Sheliah Hatch, MD   REFERRING PROVIDER: Ralene Cork, DO  REFERRING DIAG: [phone] (ICD-10-CM) - Hamstring tendonitis of right thigh  THERAPY DIAG:  Pain in right leg  Difficulty in walking, not elsewhere classified  Muscle weakness (generalized)  Cramp and spasm  Acute pain of right shoulder  Stiffness of right shoulder, not elsewhere classified  Rationale for Evaluation and Treatment: Rehabilitation  ONSET DATE: 12/03/2022  SUBJECTIVE:   SUBJECTIVE STATEMENT: Patient reports she saw Dr. Pearletha Forge for right shoulder pain.  She received cortisone injection.  She reports shoulder pain today is  significantly improved. Dr. Pearletha Forge sent referral for right shoulder evaluation.  Right hamstring pain fairly controlled per patient.   PERTINENT HISTORY: MD appt with Dr. Margaretha Sheffield on 12/02/21: Patient's knee is injected with cortisone today. An anterior lateral approach is utilized. She tolerates this without difficulty. I think she may benefit from a custom medial unloader brace from [employer]. We will put her in contact with our brace representative. Of note, she is also continue to struggle with some  proximal right hamstring tendinopathy. I recommended that we try formal physical therapy. She will follow-up with me as needed.  PAIN:  Are you having pain?  0/10 at best and 7/10 at worst  PRECAUTIONS: None  WEIGHT BEARING RESTRICTIONS: No  FALLS:  Has patient fallen in last 6 months? No  LIVING ENVIRONMENT: Lives with: lives with their spouse Lives in: House/apartment Stairs: No   OCCUPATION: retired  PLOF: Independent, Independent with basic ADLs, Independent with household mobility without device, Independent with community mobility without device, Independent with homemaking with ambulation, Independent with gait, and Independent with transfers  PATIENT GOALS: to reduce pain and to be able to exercise without exacerbation of pain  NEXT MD VISIT: prn  OBJECTIVE:   DIAGNOSTIC FINDINGS: na  PATIENT SURVEYS:  FOTO 10, goal is 71  COGNITION: Overall cognitive status: Within functional limits for tasks assessed     SENSATION: WFL   MUSCLE LENGTH: Hamstrings: Right 45 deg   POSTURE:  right knee flexed on stance phase , Rounded shoulders and fwd head  PALPATION: Tender to palpation at right ischial tuberosity  UPPER EXTREMITY ROM:   All WNL but with pain throughout ROM from approx 70 degrees of flexion to top of range on flexion and abduction.    UPPER  EXTREMITY MMT:  all were generlly 4+ to 5/5 with exception of scaption with IR and ER on right shoulder  LOWER EXTREMITY MMT:  Generally 4 to 4+/5,  right hamstring not tested due to current strain  SPECIAL TESTS (SHOULDER):  FUNCTIONAL TESTS:  5 times sit to stand: 10.70 sec Timed up and go (TUG): 8.98 sec    GAIT: Distance walked: 30 feet Assistive device utilized: None Level of assistance: Complete Independence Comments: antalgic   TODAY'S TREATMENT:                                                                                                                              DATE:  01/05/23 Assessment of right shoulder following new referral from Dr. Pearletha Forge UBE x 5 min fwd only level 1 Initiated shoulder and scapular stabilization as follows: Prone shoulder extension, rows and horizontal abduction x 20 each 0# right Side lying shoulder ER x 20 with 0# right Supine serratus punch x 20 with 3# right Supine shoulder alphabet A-Z x 1 with 3# right Prone eccentric hamstring curls with 6 lbs x 20 right Ice to right hamstring in supine x 10 min  Ice to right shoulder  x 10 min in supine  DATE: 12/31/22 Recumbent bike x 5 min Seated hamstring stretch x 3 holding 30 sec each Sit to stand x 10 Squats to table x 10 Leg press x 20 with 120 lbs both, then singles with 50 lbs x 20 each LE Eccentric hamstring lowering x 20 each LE with 5 lbs Standing hip abduction x 20 each LE with 5 lbs Seated LAQ with instruction to remain in terminal range where she had no shearing pain 5 lbs x 20 both Ice to right hamstring and bilateral knees x 10 min post treatment seated in chair. Re-sent message to Dr. Pearletha Forge re: patients shoulder issue  DATE: 12/29/22 Nustep x 5 min Sit to stand x 5 Squats to table x 5 Eccentric hamstring lowering x 20 each LE with 5 lbs Leg press x 20 with 100 lbs both Seated piriformis stretch x 3 holding 30 sec Seated LAQ with instruction to remain in terminal range where she had no shearing pain 5 lbs x 20 Ice to right hamstring and right knee x 10 min post treatment seated in chair. Re-sent message to Dr. Margaretha Sheffield and Dr. Pearletha Forge re: patients shoulder issue  PATIENT EDUCATION:  Education details: Initiated HEP Person educated: Patient Education method: Programmer, multimedia, Facilities manager, Verbal cues, and Handouts Education comprehension: verbalized understanding, returned demonstration, and verbal cues required  HOME EXERCISE PROGRAM: Access Code: [email or web address] URL: [email or web address] Date: 01/05/2023 Prepared by: Mikey Kirschner  Exercises -  Standing Hamstring Stretch with Step  - 1 x daily - 7 x weekly - 3 sets - 10 reps - Seated Hamstring Stretch  - 1 x daily - 7 x weekly - 3 sets - 10 reps - Prone Hamstring Curl with Ankle Weight  - 1 x daily - 7 x weekly - 3 sets - 10 reps - Standing Hamstring Curl with Chair Support  - 1 x daily - 7 x weekly - 3 sets - 10 reps - Hamstring Mobilization with Foam Roll  - 1 x daily - 7 x weekly - 1 sets - 1 reps - 1 min hold - Prone Shoulder Extension - Single Arm  - 2 x daily - 7 x weekly - 2 sets - 10 reps - Prone Shoulder Row  - 2 x daily - 7 x weekly - 2 sets - 10 reps - Prone Single Arm Shoulder Horizontal Abduction with Scapular Retraction and Palm Down  - 2 x daily - 7 x weekly - 2 sets - 10 reps - Sidelying Shoulder External Rotation  - 2 x daily - 7 x weekly - 2 sets - 10 reps - Single Arm Serratus Punches in Supine with Dumbbell  - 2 x daily - 7 x weekly - 2 sets - 10 reps ASSESSMENT:  CLINICAL IMPRESSION: Ferrah presents with s/s of rotator cuff tendinitis and impingement.  She also has positive Speeds test which indicated possible biceps tendinitis.  She would benefit from scapular control and rotator cuff strengthening. l  She would benefit from eccentric strengthening for hamstring, general bilateral LE strengthening, hamstring stretching, cross friction massage, possibly dry needling to elongate the hamstring, and modalities for pain relief.   OBJECTIVE IMPAIRMENTS: Abnormal gait, difficulty walking, decreased ROM, decreased strength, increased fascial restrictions, increased muscle spasms, impaired flexibility, and pain.   ACTIVITY LIMITATIONS: lifting, bending, standing, squatting, sleeping, stairs, transfers, bed mobility, and locomotion level  PARTICIPATION LIMITATIONS: meal prep, cleaning, laundry, driving, shopping, community activity, occupation, yard work, and church  PERSONAL FACTORS: Fitness, Past/current experiences, Profession,  and 1-2 comorbidities: Htn and obesity  are  also affecting patient's functional outcome.   REHAB POTENTIAL: Good  CLINICAL DECISION MAKING: Stable/uncomplicated  EVALUATION COMPLEXITY: Low   GOALS: Goals reviewed with patient? Yes  SHORT TERM GOALS: Target date: 01/05/2023   Pain report to be no greater than 4/10  Baseline: Goal status: IN PROGRESS  2.  Patient will be independent with initial HEP  Baseline:  Goal status: MET   LONG TERM GOALS: Target date: 02/02/2023    Patient to report pain no greater than 2/10  Baseline:  Goal status: INITIAL  2.  Patient to be independent with advanced HEP  Baseline:  Goal status: INITIAL  3.  Patient to be able to fully participate in her workout regimen with her trainer Baseline:  Goal status: INITIAL  4.  Patient to be able to walk 1/2 mile without pain Baseline:  Goal status: INITIAL  5.  Patient to be able to care for her disabled husband without need for frequent rest breaks due to hamstring or right knee pain. Baseline:  Goal status: INITIAL  6.  Patient to be able to stand or walk for at least 15 min without leg pain  Baseline:  Goal status: INITIAL   PLAN:  PT FREQUENCY: 1-2x/week  PT DURATION: 8 weeks  PLANNED INTERVENTIONS: Therapeutic exercises, Therapeutic activity, Neuromuscular re-education, Balance training, Gait training, Patient/Family education, Self Care, Joint mobilization, Stair training, Aquatic Therapy, Dry Needling, Electrical stimulation, Cryotherapy, Moist heat, Taping, Vasopneumatic device, Ultrasound, Ionotophoresis 4mg /ml Dexamethasone, Manual therapy, and Re-evaluation  PLAN FOR NEXT SESSION: Progress HEP, Nustep, flexibility, possibly DN, eccentric hamstring strengthening. Evaluate right shoulder if MD sends referral.    Victorino DikeJennifer B. Liviya Santini, PT 01/05/23 10:59 PM  [facility] [address], [address] [place], [place] [address] Phone # [phone] Fax [phone]

## 2023-01-07 ENCOUNTER — Ambulatory Visit: Payer: Medicare Other | Admitting: Physical Therapy

## 2023-01-19 NOTE — Therapy (Signed)
OUTPATIENT PHYSICAL THERAPY LOWER EXTREMITY TREATMENT NOTE AND RIGHT SHOULDER EVALUATION   Patient Name: Virginia Matthews MRN: [id number] DOB:1955-10-11, 67 y.o., female Today's Date: 01/22/2023  END OF SESSION:  PT End of Session - 01/22/23 0858     Visit Number 7    Date for PT Re-Evaluation 02/02/23    Authorization Type Medicare A and B and BCBS supplemental    Authorization - Visit Number 3    Progress Note Due on Visit 10    PT Start Time 0800    PT Stop Time 0843    PT Time Calculation (min) 43 min    Activity Tolerance Patient tolerated treatment well    Behavior During Therapy [facility] for tasks assessed/performed              Past Medical History:  Diagnosis Date   Breast mass, left 06/2016   Carpal tunnel syndrome on both sides    GERD (gastroesophageal reflux disease)    Heart murmur    Hypertension    states under control with med., has been on med. x 3-4 yr.   LVH (left ventricular hypertrophy)    moderate, per echo 01/2016   Moderate aortic regurgitation 07/03/2020   Obesity    PONV (postoperative nausea and vomiting)    Psoriatic arthritis (HCC)    Rosacea    Thoracic ascending aortic aneurysm (HCC)    Past Surgical History:  Procedure Laterality Date   BREAST EXCISIONAL BIOPSY Left    benign   COLONOSCOPY WITH PROPOFOL  09/12/2015   LAPAROSCOPIC ENDOMETRIOSIS FULGURATION     MICRODISCECTOMY LUMBAR Right 07/16/2011   L4-5   PELVIC LAPAROSCOPY     DIAG LAP   RADIOACTIVE SEED GUIDED EXCISIONAL BREAST BIOPSY Left 07/16/2016   Procedure: LEFT RADIOACTIVE SEED GUIDED EXCISIONAL BREAST BIOPSY;  Surgeon: Emelia Loron, MD;  Location: [facility];  Service: General;  Laterality: Left;   Patient Active Problem List   Diagnosis Date Noted   Hamstring tendonitis of right thigh 10/22/2022   Primary localized osteoarthrosis of multiple sites 05/25/2022   Moderate aortic regurgitation 07/03/2020   Right knee pain 07/01/2017   Osteoarthritis  of knees, bilateral 04/13/2017   Psoriatic arthritis (HCC) 04/17/2016   LVH (left ventricular hypertrophy) 02/11/2016   Ascending aortic aneurysm (HCC) 02/11/2016   Carotid bruit 12/31/2015   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 09/10/2015   Left wrist pain 07/05/2015   Bilateral hip pain 07/05/2015   Leg size inequality 07/05/2015   Right facial numbness 12/21/2013   Anxiety and depression 05/13/2012   Physical exam, routine 05/13/2012   GERD (gastroesophageal reflux disease) 05/13/2012   Seasonal allergies    PCO (polycystic ovaries)    Ruptured lumbar disc    Endometriosis    HYPERTENSION, BENIGN ESSENTIAL 05/01/2010   NONSPEC ELEVATION OF LEVELS OF TRANSAMINASE/LDH 03/14/2009   Severe obesity (BMI >= 40) (HCC) 02/11/2009   RHINITIS 02/11/2009   ABDOMINAL PAIN, UNSPECIFIED SITE 02/11/2009    PCP: Sheliah Hatch, MD   REFERRING PROVIDER: Ralene Cork, DO  REFERRING DIAG: [phone] (ICD-10-CM) - Hamstring tendonitis of right thigh  THERAPY DIAG:  Pain in right leg  Difficulty in walking, not elsewhere classified  Muscle weakness (generalized)  Cramp and spasm  Acute pain of right shoulder  Stiffness of right shoulder, not elsewhere classified  Rationale for Evaluation and Treatment: Rehabilitation  ONSET DATE: 12/03/2022  SUBJECTIVE:   SUBJECTIVE STATEMENT:   Patient reports she saw Dr. Pearletha Forge  for right shoulder pain.  She received cortisone injection.  She reports shoulder pain today is significantly improved. Dr. Pearletha Forge sent referral for right shoulder evaluation.  Right hamstring pain fairly controlled per patient.   PERTINENT HISTORY: MD appt with Dr. Margaretha Sheffield on 12/02/21: Patient's knee is injected with cortisone today. An anterior lateral approach is utilized. She tolerates this without difficulty. I think she may benefit from a custom medial unloader brace from [employer]. We will put her in contact with our brace  representative. Of note, she is also continue to struggle with some proximal right hamstring tendinopathy. I recommended that we try formal physical therapy. She will follow-up with me as needed.  PAIN:  Are you having pain?  0/10 at best and 7/10 at worst  PRECAUTIONS: None  WEIGHT BEARING RESTRICTIONS: No  FALLS:  Has patient fallen in last 6 months? No  LIVING ENVIRONMENT: Lives with: lives with their spouse Lives in: House/apartment Stairs: No   OCCUPATION: retired  PLOF: Independent, Independent with basic ADLs, Independent with household mobility without device, Independent with community mobility without device, Independent with homemaking with ambulation, Independent with gait, and Independent with transfers  PATIENT GOALS: to reduce pain and to be able to exercise without exacerbation of pain  NEXT MD VISIT: prn  OBJECTIVE:   DIAGNOSTIC FINDINGS: na  PATIENT SURVEYS:  FOTO 67, goal is 22  COGNITION: Overall cognitive status: Within functional limits for tasks assessed     SENSATION: WFL   MUSCLE LENGTH: Hamstrings: Right 45 deg   POSTURE:  right knee flexed on stance phase , Rounded shoulders and fwd head  PALPATION: Tender to palpation at right ischial tuberosity  UPPER EXTREMITY ROM:   All WNL but with pain throughout ROM from approx 70 degrees of flexion to top of range on flexion and abduction.    UPPER  EXTREMITY MMT:  all were generlly 4+ to 5/5 with exception of scaption with IR and ER on right shoulder  LOWER EXTREMITY MMT:  Generally 4 to 4+/5,  right hamstring not tested due to current strain  SPECIAL TESTS (SHOULDER):  FUNCTIONAL TESTS:  5 times sit to stand: 10.70 sec Timed up and go (TUG): 8.98 sec    GAIT: Distance walked: 30 feet Assistive device utilized: None Level of assistance: Complete Independence Comments: antalgic   TODAY'S TREATMENT: DATE: 01/22/23 UBE x 5 min fwd only level 1 Initiated shoulder and scapular  stabilization as follows: Prone shoulder extension, rows and horizontal abduction x 20 each 0# right Side lying shoulder ER x 20 with 0# right Supine serratus punch x 20 with 3# right Supine shoulder alphabet A-Z x 1 with 3# right Supine SLR with 3# x15, bilat LE Prone eccentric hamstring curls with 6 lbs x 20 right Ice to right hamstring in supine x 10 min 20 Ice to right shoulder x 10 min in supine  DATE: 01/05/23 Assessment of right shoulder following new referral from Dr. Pearletha Forge UBE x 5 min fwd only level 1 Initiated shoulder and scapular stabilization as follows: Prone shoulder extension, rows and horizontal abduction x 20 each 0# right Side lying shoulder ER x 20 with 0# right Supine serratus punch x 20 with 3# right Supine shoulder alphabet A-Z x 1 with 3# right Prone eccentric hamstring curls with 6 lbs x 20 right Ice to right hamstring in supine x 10 min  Ice to right shoulder x 10 min in supine  DATE: 12/31/22 Recumbent bike x 5 min Seated hamstring stretch x 3 holding 30 sec each Sit to stand x 10 Squats to table x 10 Leg press x 20 with 120 lbs both, then singles with 50 lbs x 20 each LE Eccentric hamstring lowering x 20 each LE with 5 lbs Standing hip abduction x 20 each LE with 5 lbs Seated LAQ with instruction to remain in terminal range where she had no shearing pain 5 lbs x 20 both Ice to right hamstring and bilateral knees x 10 min post treatment seated in chair. Re-sent message to Dr. Pearletha Forge re: patients shoulder issue  DATE: 12/29/22 Nustep x 5 min Sit to stand x 5 Squats to table x 5 Eccentric hamstring lowering x 20 each LE with 5 lbs Leg press x 20 with 100 lbs both Seated piriformis stretch x 3 holding 30 sec Seated LAQ with instruction to remain in terminal range where she had no shearing pain 5 lbs x 20 Ice to right hamstring and  right knee x 10 min post treatment seated in chair. Re-sent message to Dr. Margaretha Sheffield and Dr. Pearletha Forge re: patients shoulder issue  PATIENT EDUCATION:  Education details: Initiated HEP Person educated: Patient Education method: Programmer, multimedia, Facilities manager, Verbal cues, and Handouts Education comprehension: verbalized understanding, returned demonstration, and verbal cues required  HOME EXERCISE PROGRAM: Access Code: [email or web address] URL: [email or web address] Date: 01/05/2023 Prepared by: Mikey Kirschner  Exercises - Standing Hamstring Stretch with Step  - 1 x daily - 7 x weekly - 3 sets - 10 reps - Seated Hamstring Stretch  - 1 x daily - 7 x weekly - 3 sets - 10 reps - Prone Hamstring Curl with Ankle Weight  - 1 x daily - 7 x weekly - 3 sets - 10 reps - Standing Hamstring Curl with Chair Support  - 1 x daily - 7 x weekly - 3 sets - 10 reps - Hamstring Mobilization with Foam Roll  - 1 x daily - 7 x weekly - 1 sets - 1 reps - 1 min hold - Prone Shoulder Extension - Single Arm  - 2 x daily - 7 x weekly - 2 sets - 10 reps - Prone Shoulder Row  - 2 x daily - 7 x weekly - 2 sets - 10 reps - Prone Single Arm Shoulder Horizontal Abduction with Scapular Retraction and Palm Down  - 2 x daily - 7 x weekly - 2 sets - 10 reps - Sidelying Shoulder External Rotation  - 2 x daily - 7 x weekly - 2 sets - 10 reps - Single Arm Serratus Punches in Supine with Dumbbell  - 2 x daily - 7 x weekly - 2 sets - 10 reps ASSESSMENT:  CLINICAL IMPRESSION: Luvia presents with continued discomfort in her R shoulder and hamstring. She states that she just got back from a trip and feels as though the cortisone shot is still helping, but has not taken away  all the pain. Session with continued focus on parascapular and LE strengthening to tolerance today. Pt demonstrates good form, but fatigues with increased reps. Cues given for quad control with SLR's today due to extensor lag noted bilat. Pt will continue to benefit  from skilled PT to address continued deficits.   OBJECTIVE IMPAIRMENTS: Abnormal gait, difficulty walking, decreased ROM, decreased strength, increased fascial restrictions, increased muscle spasms, impaired flexibility, and pain.   ACTIVITY LIMITATIONS: lifting, bending, standing, squatting, sleeping, stairs, transfers, bed mobility, and locomotion level  PARTICIPATION LIMITATIONS: meal prep, cleaning, laundry, driving, shopping, community activity, occupation, yard work, and church  PERSONAL FACTORS: Fitness, Past/current experiences, Profession, and 1-2 comorbidities: Htn and obesity  are also affecting patient's functional outcome.   REHAB POTENTIAL: Good  CLINICAL DECISION MAKING: Stable/uncomplicated  EVALUATION COMPLEXITY: Low   GOALS: Goals reviewed with patient? Yes  SHORT TERM GOALS: Target date: 01/05/2023   Pain report to be no greater than 4/10  Baseline: Goal status: IN PROGRESS  2.  Patient will be independent with initial HEP  Baseline:  Goal status: MET   LONG TERM GOALS: Target date: 02/02/2023    Patient to report pain no greater than 2/10  Baseline:  Goal status: INITIAL  2.  Patient to be independent with advanced HEP  Baseline:  Goal status: INITIAL  3.  Patient to be able to fully participate in her workout regimen with her trainer Baseline:  Goal status: INITIAL  4.  Patient to be able to walk 1/2 mile without pain Baseline:  Goal status: INITIAL  5.  Patient to be able to care for her disabled husband without need for frequent rest breaks due to hamstring or right knee pain. Baseline:  Goal status: INITIAL  6.  Patient to be able to stand or walk for at least 15 min without leg pain  Baseline:  Goal status: INITIAL   PLAN:  PT FREQUENCY: 1-2x/week  PT DURATION: 8 weeks  PLANNED INTERVENTIONS: Therapeutic exercises, Therapeutic activity, Neuromuscular re-education, Balance training, Gait training, Patient/Family education, Self  Care, Joint mobilization, Stair training, Aquatic Therapy, Dry Needling, Electrical stimulation, Cryotherapy, Moist heat, Taping, Vasopneumatic device, Ultrasound, Ionotophoresis 4mg /ml Dexamethasone, Manual therapy, and Re-evaluation  PLAN FOR NEXT SESSION: Progress HEP, Nustep, flexibility, possibly DN, eccentric hamstring strengthening. Evaluate right shoulder if MD sends referral.    Royal Hawthorn PT, DPT 01/22/23  8:58 AM

## 2023-01-22 ENCOUNTER — Ambulatory Visit: Payer: Medicare Other | Admitting: Physical Therapy

## 2023-01-22 ENCOUNTER — Encounter: Payer: Self-pay | Admitting: Physical Therapy

## 2023-01-22 DIAGNOSIS — M25611 Stiffness of right shoulder, not elsewhere classified: Secondary | ICD-10-CM | POA: Diagnosis not present

## 2023-01-22 DIAGNOSIS — R262 Difficulty in walking, not elsewhere classified: Secondary | ICD-10-CM | POA: Diagnosis not present

## 2023-01-22 DIAGNOSIS — M25511 Pain in right shoulder: Secondary | ICD-10-CM

## 2023-01-22 DIAGNOSIS — M6281 Muscle weakness (generalized): Secondary | ICD-10-CM

## 2023-01-22 DIAGNOSIS — M79604 Pain in right leg: Secondary | ICD-10-CM

## 2023-01-22 DIAGNOSIS — R252 Cramp and spasm: Secondary | ICD-10-CM | POA: Diagnosis not present

## 2023-01-25 DIAGNOSIS — L4050 Arthropathic psoriasis, unspecified: Secondary | ICD-10-CM | POA: Diagnosis not present

## 2023-01-26 ENCOUNTER — Ambulatory Visit: Payer: Medicare Other

## 2023-01-26 DIAGNOSIS — M6281 Muscle weakness (generalized): Secondary | ICD-10-CM | POA: Diagnosis not present

## 2023-01-26 DIAGNOSIS — R262 Difficulty in walking, not elsewhere classified: Secondary | ICD-10-CM

## 2023-01-26 DIAGNOSIS — M25511 Pain in right shoulder: Secondary | ICD-10-CM | POA: Diagnosis not present

## 2023-01-26 DIAGNOSIS — R252 Cramp and spasm: Secondary | ICD-10-CM

## 2023-01-26 DIAGNOSIS — M79604 Pain in right leg: Secondary | ICD-10-CM | POA: Diagnosis not present

## 2023-01-26 DIAGNOSIS — M25611 Stiffness of right shoulder, not elsewhere classified: Secondary | ICD-10-CM

## 2023-01-26 NOTE — Therapy (Signed)
OUTPATIENT PHYSICAL THERAPY LOWER EXTREMITY TREATMENT NOTE AND RIGHT SHOULDER EVALUATION   Patient Name: Virginia Matthews MRN: [id number] DOB:[birth year], 67 y.o., female Today's Date: 01/26/2023  END OF SESSION:  PT End of Session - 01/26/23 1109     Visit Number 8    Date for PT Re-Evaluation 02/02/23    Authorization Type Medicare A and B and BCBS supplemental    Progress Note Due on Visit 10    PT Start Time 1103    PT Stop Time 1147    PT Time Calculation (min) 44 min    Activity Tolerance Patient tolerated treatment well    Behavior During Therapy WFL for tasks assessed/performed              Past Medical History:  Diagnosis Date   Breast mass, left 06/2016   Carpal tunnel syndrome on both sides    GERD (gastroesophageal reflux disease)    Heart murmur    Hypertension    states under control with med., has been on med. x 3-4 yr.   LVH (left ventricular hypertrophy)    moderate, per echo 01/2016   Moderate aortic regurgitation 07/03/2020   Obesity    PONV (postoperative nausea and vomiting)    Psoriatic arthritis (HCC)    Rosacea    Thoracic ascending aortic aneurysm (HCC)    Past Surgical History:  Procedure Laterality Date   BREAST EXCISIONAL BIOPSY Left    benign   COLONOSCOPY WITH PROPOFOL  09/12/2015   LAPAROSCOPIC ENDOMETRIOSIS FULGURATION     MICRODISCECTOMY LUMBAR Right 07/16/2011   L4-5   PELVIC LAPAROSCOPY     DIAG LAP   RADIOACTIVE SEED GUIDED EXCISIONAL BREAST BIOPSY Left 07/16/2016   Procedure: LEFT RADIOACTIVE SEED GUIDED EXCISIONAL BREAST BIOPSY;  Surgeon: Virginia Loron, MD;  Location: [facility];  Service: General;  Laterality: Left;   Patient Active Problem List   Diagnosis Date Noted   Hamstring tendonitis of right thigh 10/22/2022   Primary localized osteoarthrosis of multiple sites 05/25/2022   Moderate aortic regurgitation 07/03/2020   Right knee pain 07/01/2017   Osteoarthritis of knees, bilateral 04/13/2017    Psoriatic arthritis (HCC) 04/17/2016   LVH (left ventricular hypertrophy) 02/11/2016   Ascending aortic aneurysm (HCC) 02/11/2016   Carotid bruit 12/31/2015   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 09/10/2015   Left wrist pain 07/05/2015   Bilateral hip pain 07/05/2015   Leg size inequality 07/05/2015   Right facial numbness 12/21/2013   Anxiety and depression 05/13/2012   Physical exam, routine 05/13/2012   GERD (gastroesophageal reflux disease) 05/13/2012   Seasonal allergies    PCO (polycystic ovaries)    Ruptured lumbar disc    Endometriosis    HYPERTENSION, BENIGN ESSENTIAL 05/01/2010   NONSPEC ELEVATION OF LEVELS OF TRANSAMINASE/LDH 03/14/2009   Severe obesity (BMI >= 40) (HCC) 02/11/2009   RHINITIS 02/11/2009   ABDOMINAL PAIN, UNSPECIFIED SITE 02/11/2009    PCP: Virginia Hatch, MD   REFERRING PROVIDER: Ralene Cork, DO  REFERRING DIAG: [phone] (ICD-10-CM) - Hamstring tendonitis of right thigh  THERAPY DIAG:  Pain in right leg  Difficulty in walking, not elsewhere classified  Muscle weakness (generalized)  Cramp and spasm  Acute pain of right shoulder  Stiffness of right shoulder, not elsewhere classified  Rationale for Evaluation and Treatment: Rehabilitation  ONSET DATE: 12/03/2022  SUBJECTIVE:   SUBJECTIVE STATEMENT: Virginia Matthews was able to complete her trip with very little limitations.  "I felt it and  I did find myself being cautious with activities like getting back in the boat or stepping in and out of the boat"  She states that the shoulder is still a little uncomfortable and her right leg just feels generally weaker than the left.  PERTINENT HISTORY: MD appt with Dr. Margaretha Matthews on 12/02/21: Patient's knee is injected with cortisone today. An anterior lateral approach is utilized. She tolerates this without difficulty. I think she may benefit from a custom medial unloader brace from [employer]. We will put her in contact with  our brace representative. Of note, she is also continue to struggle with some proximal right hamstring tendinopathy. I recommended that we try formal physical therapy. She will follow-up with me as needed.  PAIN:  Are you having pain?  0/10 at best and 7/10 at worst  PRECAUTIONS: None  WEIGHT BEARING RESTRICTIONS: No  FALLS:  Has patient fallen in last 6 months? No  LIVING ENVIRONMENT: Lives with: lives with their spouse Lives in: House/apartment Stairs: No   OCCUPATION: retired  PLOF: Independent, Independent with basic ADLs, Independent with household mobility without device, Independent with community mobility without device, Independent with homemaking with ambulation, Independent with gait, and Independent with transfers  PATIENT GOALS: to reduce pain and to be able to exercise without exacerbation of pain  NEXT MD VISIT: prn  OBJECTIVE:   DIAGNOSTIC FINDINGS: na  PATIENT SURVEYS:  FOTO 59, goal is 34  COGNITION: Overall cognitive status: Within functional limits for tasks assessed     SENSATION: WFL   MUSCLE LENGTH: Hamstrings: Right 45 deg   POSTURE:  right knee flexed on stance phase , Rounded shoulders and fwd head  PALPATION: Tender to palpation at right ischial tuberosity  UPPER EXTREMITY ROM:   All WNL but with pain throughout ROM from approx 70 degrees of flexion to top of range on flexion and abduction.    UPPER  EXTREMITY MMT:  all were generlly 4+ to 5/5 with exception of scaption with IR and ER on right shoulder  LOWER EXTREMITY MMT:  Generally 4 to 4+/5,  right hamstring not tested due to current strain  SPECIAL TESTS (SHOULDER):  FUNCTIONAL TESTS:  5 times sit to stand: 10.70 sec Timed up and go (TUG): 8.98 sec    GAIT: Distance walked: 30 feet Assistive device utilized: None Level of assistance: Complete Independence Comments: antalgic   TODAY'S TREATMENT: DATE: 01/26/23 Nustep x 5 min level 5 3 way scapular stabilization  with green loop (right shoulder) x 10 4 D ball rolls x 20 each with light blue plyo ball Standing shoulder flexion with 2 lbs 2 x 10 Standing shoulder scaption with 2 lbs 2 x 10 Dead lift with 2 x 10 lbs  x 10 Single leg dead lift with 5 lbs holding on to chair in tandem stance with left foot back with 5 lbs x 10 Single leg squat (in tandem stance) x 10 each at 24" box with balance pad on top of box Sit to stand at chair with balance pad x 10 Sit to stand at chair with balance pad in tandem x 10 each LE Fwd bent shoulder extension x 20, (patient asked to lie down due to right hamstring pain) Prone rows x 20, and horizontal abduction x 20 (All with 2 lbs) Side lying right shoulder ER with 2 lbs x 20 Supine serratus punch x 20 with 2 lbs Ice to right hamstring and right shoulder seated with arm propped on table in treatment room x  10 min    DATE: 01/22/23 UBE x 5 min fwd only level 1 Initiated shoulder and scapular stabilization as follows: Prone shoulder extension, rows and horizontal abduction x 20 each 0# right Side lying shoulder ER x 20 with 0# right Supine serratus punch x 20 with 3# right Supine shoulder alphabet A-Z x 1 with 3# right Supine SLR with 3# x15, bilat LE Prone eccentric hamstring curls with 6 lbs x 20 right Ice to right hamstring in supine x 10 min 20 Ice to right shoulder x 10 min in supine                                                                                                                               DATE: 01/05/23 Assessment of right shoulder following new referral from Dr. Pearletha Forge UBE x 5 min fwd only level 1 Initiated shoulder and scapular stabilization as follows: Prone shoulder extension, rows and horizontal abduction x 20 each 0# right Side lying shoulder ER x 20 with 0# right Supine serratus punch x 20 with 3# right Supine shoulder alphabet A-Z x 1 with 3# right Prone eccentric hamstring curls with 6 lbs x 20 right Ice to right hamstring in  supine x 10 min  Ice to right shoulder x 10 min in supine   PATIENT EDUCATION:  Education details: Initiated HEP Person educated: Patient Education method: Programmer, multimedia, Facilities manager, Verbal cues, and Handouts Education comprehension: verbalized understanding, returned demonstration, and verbal cues required  HOME EXERCISE PROGRAM: Access Code: [email or web address] URL: [email or web address] Date: 01/05/2023 Prepared by: Mikey Kirschner  Exercises - Standing Hamstring Stretch with Step  - 1 x daily - 7 x weekly - 3 sets - 10 reps - Seated Hamstring Stretch  - 1 x daily - 7 x weekly - 3 sets - 10 reps - Prone Hamstring Curl with Ankle Weight  - 1 x daily - 7 x weekly - 3 sets - 10 reps - Standing Hamstring Curl with Chair Support  - 1 x daily - 7 x weekly - 3 sets - 10 reps - Hamstring Mobilization with Foam Roll  - 1 x daily - 7 x weekly - 1 sets - 1 reps - 1 min hold - Prone Shoulder Extension - Single Arm  - 2 x daily - 7 x weekly - 2 sets - 10 reps - Prone Shoulder Row  - 2 x daily - 7 x weekly - 2 sets - 10 reps - Prone Single Arm Shoulder Horizontal Abduction with Scapular Retraction and Palm Down  - 2 x daily - 7 x weekly - 2 sets - 10 reps - Sidelying Shoulder External Rotation  - 2 x daily - 7 x weekly - 2 sets - 10 reps - Single Arm Serratus Punches in Supine with Dumbbell  - 2 x daily - 7 x weekly - 2 sets - 10 reps ASSESSMENT:  CLINICAL IMPRESSION: Morayo is progressing appropriately.  She is compliant and well  motivated.  Both her right shoulder and her right hamstring are still bothersome but did not limit her on her trip.  She did mention that she compensated a lot when doing activities like getting in and out of the boat but had very little pain in either knee or the right hamstring and right shoulder.    Pt will continue to benefit from skilled PT to address continued deficits.   OBJECTIVE IMPAIRMENTS: Abnormal gait, difficulty walking, decreased ROM, decreased  strength, increased fascial restrictions, increased muscle spasms, impaired flexibility, and pain.   ACTIVITY LIMITATIONS: lifting, bending, standing, squatting, sleeping, stairs, transfers, bed mobility, and locomotion level  PARTICIPATION LIMITATIONS: meal prep, cleaning, laundry, driving, shopping, community activity, occupation, yard work, and church  PERSONAL FACTORS: Fitness, Past/current experiences, Profession, and 1-2 comorbidities: Htn and obesity  are also affecting patient's functional outcome.   REHAB POTENTIAL: Good  CLINICAL DECISION MAKING: Stable/uncomplicated  EVALUATION COMPLEXITY: Low   GOALS: Goals reviewed with patient? Yes  SHORT TERM GOALS: Target date: 01/05/2023   Pain report to be no greater than 4/10  Baseline: Goal status: MET  2.  Patient will be independent with initial HEP  Baseline:  Goal status: MET   LONG TERM GOALS: Target date: 02/02/2023    Patient to report pain no greater than 2/10  Baseline:  Goal status: INITIAL  2.  Patient to be independent with advanced HEP  Baseline:  Goal status: INITIAL  3.  Patient to be able to fully participate in her workout regimen with her trainer Baseline:  Goal status: INITIAL  4.  Patient to be able to walk 1/2 mile without pain Baseline:  Goal status: INITIAL  5.  Patient to be able to care for her disabled husband without need for frequent rest breaks due to hamstring or right knee pain. Baseline:  Goal status: INITIAL  6.  Patient to be able to stand or walk for at least 15 min without leg pain  Baseline:  Goal status: INITIAL   PLAN:  PT FREQUENCY: 1-2x/week  PT DURATION: 8 weeks  PLANNED INTERVENTIONS: Therapeutic exercises, Therapeutic activity, Neuromuscular re-education, Balance training, Gait training, Patient/Family education, Self Care, Joint mobilization, Stair training, Aquatic Therapy, Dry Needling, Electrical stimulation, Cryotherapy, Moist heat, Taping, Vasopneumatic  device, Ultrasound, Ionotophoresis 4mg /ml Dexamethasone, Manual therapy, and Re-evaluation  PLAN FOR NEXT SESSION: Focus on right hip stability and quad strength bilaterally, Nustep, flexibility, possibly DN, eccentric hamstring strengthening. Evaluate right shoulder if MD sends referral.    Victorino Dike B. Indio Santilli, PT 01/26/23 12:30 PM [facility] [address], [address] [place], [place] [address] Phone # [phone] Fax [phone]

## 2023-02-02 ENCOUNTER — Ambulatory Visit: Payer: Medicare Other | Attending: Sports Medicine

## 2023-02-02 DIAGNOSIS — R262 Difficulty in walking, not elsewhere classified: Secondary | ICD-10-CM | POA: Insufficient documentation

## 2023-02-02 DIAGNOSIS — M25511 Pain in right shoulder: Secondary | ICD-10-CM | POA: Insufficient documentation

## 2023-02-02 DIAGNOSIS — M6281 Muscle weakness (generalized): Secondary | ICD-10-CM | POA: Diagnosis not present

## 2023-02-02 DIAGNOSIS — M25611 Stiffness of right shoulder, not elsewhere classified: Secondary | ICD-10-CM | POA: Diagnosis not present

## 2023-02-02 DIAGNOSIS — R252 Cramp and spasm: Secondary | ICD-10-CM | POA: Insufficient documentation

## 2023-02-02 DIAGNOSIS — M79604 Pain in right leg: Secondary | ICD-10-CM | POA: Insufficient documentation

## 2023-02-02 NOTE — Therapy (Signed)
OUTPATIENT PHYSICAL THERAPY LOWER EXTREMITY TREATMENT NOTE AND RIGHT SHOULDER TREATMENT PHYSICAL THERAPY DISCHARGE SUMMARY  Visits from Start of Care: 9  Current functional level related to goals / functional outcomes: See below   Remaining deficits: See below   Education / Equipment: See below   Patient agrees to discharge. Patient goals were met. Patient is being discharged due to meeting the stated rehab goals.    Patient Name: Virginia Matthews MRN: [id number] DOB:01-03-1956, 67 y.o., female Today's Date: 02/02/2023  END OF SESSION:  PT End of Session - 02/02/23 0853     Visit Number 9    Date for PT Re-Evaluation 02/02/23    Authorization Type Medicare A and B and BCBS supplemental    Progress Note Due on Visit 10    PT Start Time [phone]    PT Stop Time 0930    PT Time Calculation (min) 43 min    Activity Tolerance Patient tolerated treatment well    Behavior During Therapy [facility] for tasks assessed/performed              Past Medical History:  Diagnosis Date   Breast mass, left 06/2016   Carpal tunnel syndrome on both sides    GERD (gastroesophageal reflux disease)    Heart murmur    Hypertension    states under control with med., has been on med. x 3-4 yr.   LVH (left ventricular hypertrophy)    moderate, per echo 01/2016   Moderate aortic regurgitation 07/03/2020   Obesity    PONV (postoperative nausea and vomiting)    Psoriatic arthritis (HCC)    Rosacea    Thoracic ascending aortic aneurysm [facility])    Past Surgical History:  Procedure Laterality Date   BREAST EXCISIONAL BIOPSY Left    benign   COLONOSCOPY WITH PROPOFOL  09/12/2015   LAPAROSCOPIC ENDOMETRIOSIS FULGURATION     MICRODISCECTOMY LUMBAR Right 07/16/2011   L4-5   PELVIC LAPAROSCOPY     DIAG LAP   RADIOACTIVE SEED GUIDED EXCISIONAL BREAST BIOPSY Left 07/16/2016   Procedure: LEFT RADIOACTIVE SEED GUIDED EXCISIONAL BREAST BIOPSY;  Surgeon: Emelia Loron, MD;  Location: [facility];  Service: General;  Laterality: Left;   Patient Active Problem List   Diagnosis Date Noted   Hamstring tendonitis of right thigh 10/22/2022   Primary localized osteoarthrosis of multiple sites 05/25/2022   Moderate aortic regurgitation 07/03/2020   Right knee pain 07/01/2017   Osteoarthritis of knees, bilateral 04/13/2017   Psoriatic arthritis (HCC) 04/17/2016   LVH (left ventricular hypertrophy) 02/11/2016   Ascending aortic aneurysm (HCC) 02/11/2016   Carotid bruit 12/31/2015   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 09/10/2015   Left wrist pain 07/05/2015   Bilateral hip pain 07/05/2015   Leg size inequality 07/05/2015   Right facial numbness 12/21/2013   Anxiety and depression 05/13/2012   Physical exam, routine 05/13/2012   GERD (gastroesophageal reflux disease) 05/13/2012   Seasonal allergies    PCO (polycystic ovaries)    Ruptured lumbar disc    Endometriosis    HYPERTENSION, BENIGN ESSENTIAL 05/01/2010   NONSPEC ELEVATION OF LEVELS OF TRANSAMINASE/LDH 03/14/2009   Severe obesity (BMI >= 40) (HCC) 02/11/2009   RHINITIS 02/11/2009   ABDOMINAL PAIN, UNSPECIFIED SITE 02/11/2009    PCP: Sheliah Hatch, MD   REFERRING PROVIDER: Ralene Cork, DO  REFERRING DIAG: [phone] (ICD-10-CM) - Hamstring tendonitis of right thigh  THERAPY DIAG:  Pain in right leg  Difficulty in walking,  not elsewhere classified  Muscle weakness (generalized)  Cramp and spasm  Acute pain of right shoulder  Stiffness of right shoulder, not elsewhere classified  Rationale for Evaluation and Treatment: Rehabilitation  ONSET DATE: 12/03/2022  SUBJECTIVE:   SUBJECTIVE STATEMENT: Patient states she is hovering at about a 3-4/10 at worst on both the shoulder and hamstring issue.  She states she occasionally has increased hamstring pain if she is more active like hiking.  She agrees that she feels she can continue to manage her symptoms with the  exercises she has been given and is ready for DC.   PERTINENT HISTORY: MD appt with Dr. Margaretha Sheffield on 12/02/21: Patient's knee is injected with cortisone today. An anterior lateral approach is utilized. She tolerates this without difficulty. I think she may benefit from a custom medial unloader brace from [employer]. We will put her in contact with our brace representative. Of note, she is also continue to struggle with some proximal right hamstring tendinopathy. I recommended that we try formal physical therapy. She will follow-up with me as needed.  PAIN:  Are you having pain?  0/10 at best and 7/10 at worst  PRECAUTIONS: None  WEIGHT BEARING RESTRICTIONS: No  FALLS:  Has patient fallen in last 6 months? No  LIVING ENVIRONMENT: Lives with: lives with their spouse Lives in: House/apartment Stairs: No   OCCUPATION: retired  PLOF: Independent, Independent with basic ADLs, Independent with household mobility without device, Independent with community mobility without device, Independent with homemaking with ambulation, Independent with gait, and Independent with transfers  PATIENT GOALS: to reduce pain and to be able to exercise without exacerbation of pain  NEXT MD VISIT: prn  OBJECTIVE:   DIAGNOSTIC FINDINGS: na  PATIENT SURVEYS:  Initial eval: FOTO 76, goal is 65  02/02/23: FOTO 72, goal was 69  COGNITION: Overall cognitive status: Within functional limits for tasks assessed     SENSATION: WFL   MUSCLE LENGTH: Hamstrings: Right 45 deg, improved to 55 degrees on DC   POSTURE:  right knee flexed on stance phase , Rounded shoulders and fwd head   02/02/23: patient is walking with improved heel to toe gait and improved right knee extension  PALPATION: Tender to palpation at right ischial tuberosity (no longer complaining of severe tenderness on DC)  UPPER EXTREMITY ROM:   All WNL but with pain throughout ROM from approx 70 degrees of flexion to top of range on flexion and  abduction.    UPPER  EXTREMITY MMT:  all were generlly 4+ to 5/5 with exception of scaption with IR and ER on right shoulder  LOWER EXTREMITY MMT:  Generally 4 to 4+/5,  right hamstring not tested due to current strain 02/02/23: right hamstring 4+5, right quad 4+/5 with some anterior knee pain reported  SPECIAL TESTS (SHOULDER):  FUNCTIONAL TESTS:  Initial eval: 5 times sit to stand: 10.70 sec Timed up and go (TUG): 8.98 sec  02/02/23 (DC): 5 times sit to stand: 9.24 sec Timed up and go (TUG): 6.53 sec     GAIT: Distance walked: 100 feet Assistive device utilized: None Level of assistance: Complete Independence Comments: antalgic on start up but improves to good heel to toe progression    TODAY'S TREATMENT: DATE: 02/02/23 Nustep x 5 min level 5 3 way scapular stabilization with blue loop (right shoulder) x 10 4 D ball rolls x 20 each with light blue plyo ball Reviewed all of HEP and how to progress at DC Instructed on pain control Sit  to stand in tandem 2 x 10 each DC assessment completed  DATE: 01/26/23 Nustep x 5 min level 5 3 way scapular stabilization with green loop (right shoulder) x 10 4 D ball rolls x 20 each with light blue plyo ball Standing shoulder flexion with 2 lbs 2 x 10 Standing shoulder scaption with 2 lbs 2 x 10 Dead lift with 2 x 10 lbs  x 10 Single leg dead lift with 5 lbs holding on to chair in tandem stance with left foot back with 5 lbs x 10 Single leg squat (in tandem stance) x 10 each at 24" box with balance pad on top of box Sit to stand at chair with balance pad x 10 Sit to stand at chair with balance pad in tandem x 10 each LE Fwd bent shoulder extension x 20, (patient asked to lie down due to right hamstring pain) Prone rows x 20, and horizontal abduction x 20 (All with 2 lbs) Side lying right shoulder ER with 2 lbs x 20 Supine serratus punch x 20 with 2 lbs Ice to right hamstring and right shoulder seated with arm propped on table in  treatment room x 10 min    DATE: 01/22/23 UBE x 5 min fwd only level 1 Initiated shoulder and scapular stabilization as follows: Prone shoulder extension, rows and horizontal abduction x 20 each 0# right Side lying shoulder ER x 20 with 0# right Supine serratus punch x 20 with 3# right Supine shoulder alphabet A-Z x 1 with 3# right Supine SLR with 3# x15, bilat LE Prone eccentric hamstring curls with 6 lbs x 20 right Ice to right hamstring in supine x 10 min 20 Ice to right shoulder x 10 min in supine                                                                                                                                PATIENT EDUCATION:  Education details: Initiated HEP Person educated: Patient Education method: Programmer, multimedia, Facilities manager, Verbal cues, and Handouts Education comprehension: verbalized understanding, returned demonstration, and verbal cues required  HOME EXERCISE PROGRAM: Access Code: [email or web address] URL: [email or web address] Date: 02/02/2023 Prepared by: Mikey Kirschner  Exercises - Standing Hamstring Stretch with Step  - 1 x daily - 7 x weekly - 3 sets - 10 reps - Seated Hamstring Stretch  - 1 x daily - 7 x weekly - 3 sets - 10 reps - Prone Hamstring Curl with Ankle Weight  - 1 x daily - 7 x weekly - 3 sets - 10 reps - Standing Hamstring Curl with Chair Support  - 1 x daily - 7 x weekly - 3 sets - 10 reps - Hamstring Mobilization with Foam Roll  - 1 x daily - 7 x weekly - 1 sets - 1 reps - 1 min hold - Prone Shoulder Extension - Single Arm  - 2 x daily - 7 x weekly - 2 sets - 10  reps - Prone Shoulder Row  - 2 x daily - 7 x weekly - 2 sets - 10 reps - Prone Single Arm Shoulder Horizontal Abduction with Scapular Retraction and Palm Down  - 2 x daily - 7 x weekly - 2 sets - 10 reps - Sidelying Shoulder External Rotation  - 2 x daily - 7 x weekly - 2 sets - 10 reps - Single Arm Serratus Punches in Supine with Dumbbell  - 2 x daily - 7 x weekly - 2  sets - 10 reps - Standing Shoulder Flexion to 90 Degrees with Dumbbells  - 2 x daily - 7 x weekly - 2 sets - 10 reps - Standing Shoulder Scaption  - 2 x daily - 7 x weekly - 2 sets - 10 reps - Horizontal Wall Walk with Resistance  - 1 x daily - 7 x weekly - 3 sets - 10 reps - Band Walks  - 1 x daily - 7 x weekly - 3 sets - 10 reps - Seated Long Arc Quad  - 1 x daily - 7 x weekly - 3 sets - 10 reps - Supine ITB Stretch with Strap  - 1 x daily - 7 x weekly - 1 sets - 3 reps - 30 sec hold - Standing Quad Stretch with Table and Chair Support  - 1 x daily - 7 x weekly - 1 sets - 3 reps - 30 sec hold  ASSESSMENT: CLINICAL IMPRESSION: Chauntel has met her basic goals.  She continues to have pain at times due to the chronic nature of her shoulder and hamstring.  She realizes that if her symptoms persist despite being consistent with her HEP, she will need to see MD for other possible interventions.  We updated and reviewed her HEP today.  She is well motivated and compliant.  She should continue to do well and be able to manage her symptoms effectively.  We will DC at this time.     OBJECTIVE IMPAIRMENTS: Abnormal gait, difficulty walking, decreased ROM, decreased strength, increased fascial restrictions, increased muscle spasms, impaired flexibility, and pain.   ACTIVITY LIMITATIONS: lifting, bending, standing, squatting, sleeping, stairs, transfers, bed mobility, and locomotion level  PARTICIPATION LIMITATIONS: meal prep, cleaning, laundry, driving, shopping, community activity, occupation, yard work, and church  PERSONAL FACTORS: Fitness, Past/current experiences, Profession, and 1-2 comorbidities: Htn and obesity  are also affecting patient's functional outcome.   REHAB POTENTIAL: Good  CLINICAL DECISION MAKING: Stable/uncomplicated  EVALUATION COMPLEXITY: Low   GOALS: Goals reviewed with patient? Yes  SHORT TERM GOALS: Target date: 01/05/2023   Pain report to be no greater than 4/10   Baseline: Goal status: MET  2.  Patient will be independent with initial HEP  Baseline:  Goal status: MET   LONG TERM GOALS: Target date: 02/02/2023    Patient to report pain no greater than 2/10  Baseline:  Goal status: MET  2.  Patient to be independent with advanced HEP  Baseline:  Goal status: MET  3.  Patient to be able to fully participate in her workout regimen with her trainer Baseline:  Goal status: MET  4.  Patient to be able to walk 1/2 mile without pain Baseline:  Goal status: NOT MET  5.  Patient to be able to care for her disabled husband without need for frequent rest breaks due to hamstring or right knee pain. Baseline:  Goal status: MET  6.  Patient to be able to stand or walk for at  least 15 min without leg pain  Baseline:  Goal status: IN PROGRESS   PLAN:  PT FREQUENCY: 1-2x/week  PT DURATION: 8 weeks  PLANNED INTERVENTIONS: Therapeutic exercises, Therapeutic activity, Neuromuscular re-education, Balance training, Gait training, Patient/Family education, Self Care, Joint mobilization, Stair training, Aquatic Therapy, Dry Needling, Electrical stimulation, Cryotherapy, Moist heat, Taping, Vasopneumatic device, Ultrasound, Ionotophoresis 4mg /ml Dexamethasone, Manual therapy, and Re-evaluation  PLAN FOR NEXT SESSION: We will DC at this time with all goals met except pain with walking longer distances.     Victorino Dike B. Jemmie Rhinehart, PT 02/02/23 4:50 PM  [facility] [address], [address] [place], [place] [address] Phone # [phone] Fax [phone]

## 2023-03-12 ENCOUNTER — Other Ambulatory Visit: Payer: Self-pay | Admitting: Family Medicine

## 2023-03-12 DIAGNOSIS — Z1231 Encounter for screening mammogram for malignant neoplasm of breast: Secondary | ICD-10-CM

## 2023-03-22 DIAGNOSIS — Z79899 Other long term (current) drug therapy: Secondary | ICD-10-CM | POA: Diagnosis not present

## 2023-03-22 DIAGNOSIS — L4050 Arthropathic psoriasis, unspecified: Secondary | ICD-10-CM | POA: Diagnosis not present

## 2023-03-31 DIAGNOSIS — M25561 Pain in right knee: Secondary | ICD-10-CM | POA: Diagnosis not present

## 2023-03-31 DIAGNOSIS — M25562 Pain in left knee: Secondary | ICD-10-CM | POA: Diagnosis not present

## 2023-04-07 ENCOUNTER — Ambulatory Visit
Admission: RE | Admit: 2023-04-07 | Discharge: 2023-04-07 | Disposition: A | Discharge status: Home / Self Care | Payer: Medicare Other | Source: Ambulatory Visit

## 2023-04-07 DIAGNOSIS — Z1231 Encounter for screening mammogram for malignant neoplasm of breast: Secondary | ICD-10-CM | POA: Diagnosis not present

## 2023-04-26 DIAGNOSIS — M1711 Unilateral primary osteoarthritis, right knee: Secondary | ICD-10-CM | POA: Diagnosis not present

## 2023-04-26 MED ORDER — METHYLPREDNISOLONE ACETATE 40 MG/ML IJ SUSP
40.0000 mg | Freq: Once | INTRAMUSCULAR | Status: AC
  Administered 2023-04-26: 40 mg via INTRA_ARTICULAR

## 2023-04-26 NOTE — Addendum Note (Signed)
Addended by: Annita Brod on: 04/26/2023 02:31 PM   Modules accepted: Orders

## 2023-04-27 ENCOUNTER — Encounter: Payer: Self-pay | Admitting: Sports Medicine

## 2023-04-27 ENCOUNTER — Ambulatory Visit: Payer: Medicare Other | Admitting: Sports Medicine

## 2023-04-27 VITALS — BP 116/72 | HR 74 | Ht 66.0 in | Wt 279.0 lb

## 2023-04-27 DIAGNOSIS — M170 Bilateral primary osteoarthritis of knee: Secondary | ICD-10-CM

## 2023-04-27 MED ORDER — METHYLPREDNISOLONE ACETATE 40 MG/ML IJ SUSP
40.0000 mg | Freq: Once | INTRAMUSCULAR | Status: AC
  Administered 2023-04-27: 40 mg via INTRA_ARTICULAR

## 2023-04-27 NOTE — Progress Notes (Signed)
Patient ID: Virginia Matthews, female   DOB: 03/31/1956, 67 y.o.   MRN: [id number]  Patient presents today requesting repeat cortisone injections into each knee.  She has a well-documented history of advanced DJD in both knees.  She has visited with an orthopedist and discuss total knee replacement.  She is in the process of losing weight to meet the BMI criteria of 40 for surgery.  She has lost 30 pounds already.  She is leaving in 10 days for a trip to [place] and has done well in the past with cortisone injections prior to previous trips.   Physical exam of both knees shows range of motion from 0 to 120 degrees.  1+ boggy synovitis.  No erythema.  Each of her knees were injected today with cortisone.  This was done using an anterior lateral approach.  She tolerates this without difficulty.  She will continue with her custom medial unloader brace as she does find this to be helpful.  Follow-up with me as needed.  Consent obtained and verified. Time-out conducted. Noted no overlying erythema, induration, or other signs of local infection. Skin prepped in a sterile fashion. Topical analgesic spray: Ethyl chloride. Joint: Right knee Needle: 25-gauge 1.5 inch Completed without difficulty. Meds: 3 cc 1% Xylocaine, 1 cc (40 mg) Depo-Medrol  Consent obtained and verified. Time-out conducted. Noted no overlying erythema, induration, or other signs of local infection. Skin prepped in a sterile fashion. Topical analgesic spray: Ethyl chloride. Joint: Left knee Needle: 25-gauge 1.5 inch Completed without difficulty. Meds: 3 cc 1% Xylocaine, 1 cc (40 mg) Depo-Medrol

## 2023-05-03 ENCOUNTER — Telehealth: Payer: Self-pay

## 2023-05-03 ENCOUNTER — Encounter: Payer: Self-pay | Admitting: Family Medicine

## 2023-05-03 ENCOUNTER — Ambulatory Visit (INDEPENDENT_AMBULATORY_CARE_PROVIDER_SITE_OTHER): Payer: Medicare Other | Admitting: Family Medicine

## 2023-05-03 VITALS — BP 118/70 | HR 61 | Temp 98.7°F | Resp 19 | Ht 66.0 in | Wt 259.0 lb

## 2023-05-03 DIAGNOSIS — I10 Essential (primary) hypertension: Secondary | ICD-10-CM | POA: Diagnosis not present

## 2023-05-03 DIAGNOSIS — Z23 Encounter for immunization: Secondary | ICD-10-CM | POA: Diagnosis not present

## 2023-05-03 LAB — BASIC METABOLIC PANEL
BUN: 19 mg/dL (ref 6–23)
CO2: 27 mEq/L (ref 19–32)
Calcium: 10.2 mg/dL (ref 8.4–10.5)
Chloride: 101 mEq/L (ref 96–112)
Creatinine, Ser: 0.82 mg/dL (ref 0.40–1.20)
GFR: 74.21 mL/min (ref >60.00)
Glucose, Bld: 83 mg/dL (ref 70–99)
Potassium: 4.2 mEq/L (ref 3.5–5.1)
Sodium: 137 mEq/L (ref 135–145)

## 2023-05-03 LAB — HEPATIC FUNCTION PANEL
ALT: 24 U/L (ref 0–35)
AST: 22 U/L (ref 0–37)
Albumin: 4.4 g/dL (ref 3.5–5.2)
Alkaline Phosphatase: 69 U/L (ref 39–117)
Bilirubin, Direct: 0.1 mg/dL (ref 0.0–0.3)
Total Bilirubin: 0.6 mg/dL (ref 0.2–1.2)
Total Protein: 8 g/dL (ref 6.0–8.3)

## 2023-05-03 LAB — LIPID PANEL
Cholesterol: 162 mg/dL (ref 0–200)
HDL: 59.7 mg/dL (ref >39.00)
LDL Cholesterol: 80 mg/dL (ref 0–99)
NonHDL: 102.13
Total CHOL/HDL Ratio: 3
Triglycerides: 111 mg/dL (ref 0.0–149.0)
VLDL: 22.2 mg/dL (ref 0.0–40.0)

## 2023-05-03 LAB — CBC WITH DIFFERENTIAL/PLATELET
Basophils Absolute: 0.1 10*3/uL (ref 0.0–0.1)
Basophils Relative: 1 % (ref 0.0–3.0)
Eosinophils Absolute: 0.4 10*3/uL (ref 0.0–0.7)
Eosinophils Relative: 5.4 % — ABNORMAL HIGH (ref 0.0–5.0)
HCT: 42.6 % (ref 36.0–46.0)
Hemoglobin: 13.8 g/dL (ref 12.0–15.0)
Lymphocytes Relative: 27.3 % (ref 12.0–46.0)
Lymphs Abs: 2 10*3/uL (ref 0.7–4.0)
MCHC: 32.3 g/dL (ref 30.0–36.0)
MCV: 94.8 fl (ref 78.0–100.0)
Monocytes Absolute: 0.5 10*3/uL (ref 0.1–1.0)
Monocytes Relative: 7.2 % (ref 3.0–12.0)
Neutro Abs: 4.3 10*3/uL (ref 1.4–7.7)
Neutrophils Relative %: 59.1 % (ref 43.0–77.0)
Platelets: 305 10*3/uL (ref 150.0–400.0)
RBC: 4.49 Mil/uL (ref 3.87–5.11)
RDW: 13.1 % (ref 11.5–15.5)
WBC: 7.3 10*3/uL (ref 4.0–10.5)

## 2023-05-03 LAB — TSH: TSH: 1.39 u[IU]/mL (ref 0.35–5.50)

## 2023-05-03 NOTE — Assessment & Plan Note (Signed)
She has lost nearly 40 lbs since last summer!  She is eating a high protein, lower carb diet and exercising regularly.  Applauded her efforts and encouraged her to continue.  Very proud of what she has accomplished!

## 2023-05-03 NOTE — Telephone Encounter (Signed)
When I gave the pt her Prevnar  20 vaccine and I was explaining the possible side effects such as sore arm, knot under the injection site and possible fatigue I had ask the pt to sign the immunization consent form . She took the form and pen but did not sign. I caught this and she had left the building and when I called the pt to ask her if she would come back to sign she said no she lived a half hour away and she was going to [place] and was not coming back to sign this .  Pt did receive the Prevnar 20 in left deltoid at her visit today .

## 2023-05-03 NOTE — Patient Instructions (Signed)
Follow up in 6 months to recheck BP and continued weight loss We'll notify you of your lab results and make any changes if needed Continue to work on healthy diet and regular exercise- you're doing great!!! Call with any questions or concerns Stay Safe!  Stay Healthy! I'M SO PROUD OF YOU!!! Enjoy [place]!!

## 2023-05-03 NOTE — Assessment & Plan Note (Signed)
Chronic problem.  Excellent control today on Lisinopril 20mg  daily.  Currently asymptomatic.  Check labs due to ACE but no anticipated med changes.  Will follow.

## 2023-05-03 NOTE — Telephone Encounter (Signed)
I called the pt back to see if I could email her the immunization form and she sign it and email it back . She did not answer I did leave her a VM asking her this information and to return my call as of now she has not

## 2023-05-03 NOTE — Progress Notes (Signed)
   Subjective:    Patient ID: Virginia Matthews, female    DOB: 02-11-1956, 67 y.o.   MRN: [id number]  HPI HTN- chronic problem, currently on Lisinopril 20mg  daily.  No CP, SOB, HA's, visual changes, edema.  Obesity- pt is down 40 lbs since last summer.  Pt has been losing weight in hopes of getting knee replacement.  Eating high protein, lower carb Mediterranean diet.  Exercising regularly- weight training and walking more.   Review of Systems For ROS see HPI     Objective:   Physical Exam Vitals reviewed.  Constitutional:      General: She is not in acute distress.    Appearance: Normal appearance. She is well-developed. She is obese. She is not ill-appearing.  HENT:     Head: Normocephalic and atraumatic.  Eyes:     Conjunctiva/sclera: Conjunctivae normal.     Pupils: Pupils are equal, round, and reactive to light.  Neck:     Thyroid: No thyromegaly.  Cardiovascular:     Rate and Rhythm: Normal rate and regular rhythm.     Pulses: Normal pulses.     Heart sounds: Normal heart sounds. No murmur heard. Pulmonary:     Effort: Pulmonary effort is normal. No respiratory distress.     Breath sounds: Normal breath sounds.  Abdominal:     General: There is no distension.     Palpations: Abdomen is soft.     Tenderness: There is no abdominal tenderness.  Musculoskeletal:     Cervical back: Normal range of motion and neck supple.     Right lower leg: No edema.     Left lower leg: No edema.  Lymphadenopathy:     Cervical: No cervical adenopathy.  Skin:    General: Skin is warm and dry.  Neurological:     Mental Status: She is alert and oriented to person, place, and time.  Psychiatric:        Mood and Affect: Mood normal.        Behavior: Behavior normal.        Thought Content: Thought content normal.           Assessment & Plan:

## 2023-05-04 ENCOUNTER — Telehealth: Payer: Self-pay | Admitting: Family Medicine

## 2023-05-04 ENCOUNTER — Telehealth: Payer: Self-pay

## 2023-05-04 NOTE — Telephone Encounter (Signed)
-----   Message from Neena Rhymes sent at 05/04/2023  7:40 AM EDT ----- Labs look great!  No changes at this time

## 2023-05-04 NOTE — Telephone Encounter (Signed)
Caller name: KEZIAH BARASCH  On DPR?: Yes  Call back number: [phone] (mobile)  Provider they see: Sheliah Hatch, MD  Reason for call: Returning Diamond's call left her email address if something else call her  [email or web address]

## 2023-05-04 NOTE — Telephone Encounter (Signed)
Form has been signed and printed placed in scan

## 2023-05-04 NOTE — Telephone Encounter (Signed)
I am emailing her the form to be signed and send back

## 2023-05-04 NOTE — Telephone Encounter (Signed)
Sent results thru My chart

## 2023-05-14 ENCOUNTER — Other Ambulatory Visit: Payer: Self-pay

## 2023-05-14 DIAGNOSIS — F32A Depression, unspecified: Secondary | ICD-10-CM

## 2023-05-14 MED ORDER — ESCITALOPRAM OXALATE 10 MG PO TABS: ORAL_TABLET | Freq: Every day | ORAL | 1 refills | Status: DC

## 2023-06-02 DIAGNOSIS — L409 Psoriasis, unspecified: Secondary | ICD-10-CM | POA: Diagnosis not present

## 2023-06-02 DIAGNOSIS — Z79899 Other long term (current) drug therapy: Secondary | ICD-10-CM | POA: Diagnosis not present

## 2023-06-02 DIAGNOSIS — Z6841 Body Mass Index (BMI) 40.0 and over, adult: Secondary | ICD-10-CM | POA: Diagnosis not present

## 2023-06-02 DIAGNOSIS — M1991 Primary osteoarthritis, unspecified site: Secondary | ICD-10-CM | POA: Diagnosis not present

## 2023-06-02 DIAGNOSIS — M0579 Rheumatoid arthritis with rheumatoid factor of multiple sites without organ or systems involvement: Secondary | ICD-10-CM | POA: Diagnosis not present

## 2023-06-02 DIAGNOSIS — L4050 Arthropathic psoriasis, unspecified: Secondary | ICD-10-CM | POA: Diagnosis not present

## 2023-06-07 DIAGNOSIS — R5383 Other fatigue: Secondary | ICD-10-CM | POA: Diagnosis not present

## 2023-06-07 DIAGNOSIS — Z111 Encounter for screening for respiratory tuberculosis: Secondary | ICD-10-CM | POA: Diagnosis not present

## 2023-06-07 DIAGNOSIS — L4050 Arthropathic psoriasis, unspecified: Secondary | ICD-10-CM | POA: Diagnosis not present

## 2023-06-07 DIAGNOSIS — Z79899 Other long term (current) drug therapy: Secondary | ICD-10-CM | POA: Diagnosis not present

## 2023-07-20 DIAGNOSIS — Z23 Encounter for immunization: Secondary | ICD-10-CM | POA: Diagnosis not present

## 2023-08-02 DIAGNOSIS — L4050 Arthropathic psoriasis, unspecified: Secondary | ICD-10-CM | POA: Diagnosis not present

## 2023-08-16 ENCOUNTER — Telehealth: Payer: Self-pay | Admitting: Cardiovascular Disease

## 2023-08-16 ENCOUNTER — Encounter (HOSPITAL_BASED_OUTPATIENT_CLINIC_OR_DEPARTMENT_OTHER): Payer: Self-pay

## 2023-08-16 NOTE — Telephone Encounter (Signed)
Pt had an Echo scheduled for 09/07/23 but pt will be out of town. She wants to r/s it to 09/10/23, but the order expires on 09/07/23. Can you please change  the order so pt can have it on 09/10/23 at 10:00. A hold has been place for this time slot. Thank You.

## 2023-08-19 DIAGNOSIS — L814 Other melanin hyperpigmentation: Secondary | ICD-10-CM | POA: Diagnosis not present

## 2023-08-19 DIAGNOSIS — D2262 Melanocytic nevi of left upper limb, including shoulder: Secondary | ICD-10-CM | POA: Diagnosis not present

## 2023-08-19 DIAGNOSIS — L821 Other seborrheic keratosis: Secondary | ICD-10-CM | POA: Diagnosis not present

## 2023-08-19 DIAGNOSIS — L918 Other hypertrophic disorders of the skin: Secondary | ICD-10-CM | POA: Diagnosis not present

## 2023-08-19 DIAGNOSIS — D225 Melanocytic nevi of trunk: Secondary | ICD-10-CM | POA: Diagnosis not present

## 2023-08-19 DIAGNOSIS — D2362 Other benign neoplasm of skin of left upper limb, including shoulder: Secondary | ICD-10-CM | POA: Diagnosis not present

## 2023-08-19 DIAGNOSIS — Z85828 Personal history of other malignant neoplasm of skin: Secondary | ICD-10-CM | POA: Diagnosis not present

## 2023-08-19 DIAGNOSIS — L400 Psoriasis vulgaris: Secondary | ICD-10-CM | POA: Diagnosis not present

## 2023-08-19 DIAGNOSIS — L718 Other rosacea: Secondary | ICD-10-CM | POA: Diagnosis not present

## 2023-08-19 DIAGNOSIS — D2272 Melanocytic nevi of left lower limb, including hip: Secondary | ICD-10-CM | POA: Diagnosis not present

## 2023-08-19 DIAGNOSIS — D2239 Melanocytic nevi of other parts of face: Secondary | ICD-10-CM | POA: Diagnosis not present

## 2023-08-20 DIAGNOSIS — M170 Bilateral primary osteoarthritis of knee: Secondary | ICD-10-CM | POA: Diagnosis not present

## 2023-09-01 ENCOUNTER — Other Ambulatory Visit (HOSPITAL_BASED_OUTPATIENT_CLINIC_OR_DEPARTMENT_OTHER): Payer: Self-pay | Admitting: Cardiovascular Disease

## 2023-09-07 ENCOUNTER — Other Ambulatory Visit (HOSPITAL_BASED_OUTPATIENT_CLINIC_OR_DEPARTMENT_OTHER): Payer: Medicare Other

## 2023-09-10 ENCOUNTER — Ambulatory Visit (HOSPITAL_BASED_OUTPATIENT_CLINIC_OR_DEPARTMENT_OTHER): Payer: Medicare Other

## 2023-09-10 DIAGNOSIS — I7121 Aneurysm of the ascending aorta, without rupture: Secondary | ICD-10-CM

## 2023-09-10 DIAGNOSIS — I10 Essential (primary) hypertension: Secondary | ICD-10-CM | POA: Diagnosis not present

## 2023-09-10 LAB — ECHOCARDIOGRAM COMPLETE
AR max vel: 1.52 cm2
AV Area VTI: 1.75 cm2
AV Area mean vel: 1.52 cm2
AV Mean grad: 5 mm[Hg]
AV Peak grad: 11.8 mm[Hg]
AV Vena cont: 0.49 cm
Ao pk vel: 1.72 m/s
Area-P 1/2: 2.96 cm2
P 1/2 time: 752 ms
S' Lateral: 3.48 cm

## 2023-09-13 ENCOUNTER — Encounter: Payer: Self-pay | Admitting: Orthopaedic Surgery

## 2023-09-13 ENCOUNTER — Ambulatory Visit (INDEPENDENT_AMBULATORY_CARE_PROVIDER_SITE_OTHER): Payer: Medicare Other | Admitting: Orthopaedic Surgery

## 2023-09-13 VITALS — Ht 66.0 in | Wt 264.0 lb

## 2023-09-13 DIAGNOSIS — M1712 Unilateral primary osteoarthritis, left knee: Secondary | ICD-10-CM | POA: Diagnosis not present

## 2023-09-13 DIAGNOSIS — M1711 Unilateral primary osteoarthritis, right knee: Secondary | ICD-10-CM | POA: Insufficient documentation

## 2023-09-13 DIAGNOSIS — M25561 Pain in right knee: Secondary | ICD-10-CM | POA: Diagnosis not present

## 2023-09-13 DIAGNOSIS — M25562 Pain in left knee: Secondary | ICD-10-CM

## 2023-09-13 DIAGNOSIS — G8929 Other chronic pain: Secondary | ICD-10-CM | POA: Diagnosis not present

## 2023-09-13 NOTE — Progress Notes (Signed)
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  She has been dealing with bilateral knee pain for a long period time of the right worse than left.  She has had multiple injections in her knees and most recently around Thanksgiving had injections.  She is not a diabetic.  She does have high blood pressure that is well-controlled.  Her knees hurt on a daily basis and they are detrimentally affecting her mobility, her quality of life and her actives daily living.  At her highest weight she weighed well over 300 pounds.  Today she is down to 64 but that still gives her a BMI of 42.61.  She absolutely understands that we cannot proceed with scheduling her surgery until her BMI is 39.99.  She is managing now and does think about knee replacement surgery in the future.  Her right knee hurts much worse than her left knee.  She is not a diabetic and not a smoker.  Her husband is significantly disabled and limited in his ability to help her out during the recovery process when she does consider knee replacement surgery.  Both her knees are examined and have good range of motion.  There is slight varus malalignment that is correctable.  There is patellofemoral crepitation and global tenderness.  Both knees are ligamentously stable.  The x-rays of both knees show severe end-stage arthritis with varus malalignment, bone-on-bone wear of the medial compartment and patellofemoral compartment.  There are large osteophytes in all 3 compartments of both knees.  We had a long and thorough discussion about knee replacement surgery.  She understands that we will have to continue to follow her along on this weight loss journey and she agrees with this as well.  With that being said we will see her back in 3 months for repeat weight and BMI calculation and go from there.  At some point I will show her knee replacement model.  I did go over her x-rays with her as well.  All questions and concerns  were addressed and answered.  She is a very pleasant individual and highly motivated and I do believe that she would do incredibly well with knee replacement surgery.

## 2023-09-27 DIAGNOSIS — Z79899 Other long term (current) drug therapy: Secondary | ICD-10-CM | POA: Diagnosis not present

## 2023-09-27 DIAGNOSIS — L4050 Arthropathic psoriasis, unspecified: Secondary | ICD-10-CM | POA: Diagnosis not present

## 2023-10-04 ENCOUNTER — Other Ambulatory Visit (HOSPITAL_BASED_OUTPATIENT_CLINIC_OR_DEPARTMENT_OTHER): Payer: Self-pay | Admitting: *Deleted

## 2023-10-11 ENCOUNTER — Other Ambulatory Visit (HOSPITAL_BASED_OUTPATIENT_CLINIC_OR_DEPARTMENT_OTHER): Payer: Self-pay | Admitting: Cardiovascular Disease

## 2023-11-03 ENCOUNTER — Ambulatory Visit: Payer: Medicare Other | Admitting: Family Medicine

## 2023-11-03 ENCOUNTER — Encounter: Payer: Self-pay | Admitting: Family Medicine

## 2023-11-03 ENCOUNTER — Telehealth: Payer: Self-pay

## 2023-11-03 VITALS — BP 112/62 | HR 56 | Temp 98.3°F | Ht 66.0 in | Wt 265.3 lb

## 2023-11-03 DIAGNOSIS — F419 Anxiety disorder, unspecified: Secondary | ICD-10-CM | POA: Diagnosis not present

## 2023-11-03 DIAGNOSIS — L03116 Cellulitis of left lower limb: Secondary | ICD-10-CM | POA: Diagnosis not present

## 2023-11-03 DIAGNOSIS — Z780 Asymptomatic menopausal state: Secondary | ICD-10-CM | POA: Diagnosis not present

## 2023-11-03 DIAGNOSIS — Z6841 Body Mass Index (BMI) 40.0 and over, adult: Secondary | ICD-10-CM

## 2023-11-03 DIAGNOSIS — I10 Essential (primary) hypertension: Secondary | ICD-10-CM

## 2023-11-03 DIAGNOSIS — F32A Depression, unspecified: Secondary | ICD-10-CM

## 2023-11-03 LAB — LIPID PANEL
Cholesterol: 172 mg/dL (ref 0–200)
HDL: 65.6 mg/dL (ref >39.00)
LDL Cholesterol: 88 mg/dL (ref 0–99)
NonHDL: 106.79
Total CHOL/HDL Ratio: 3
Triglycerides: 93 mg/dL (ref 0.0–149.0)
VLDL: 18.6 mg/dL (ref 0.0–40.0)

## 2023-11-03 LAB — CBC WITH DIFFERENTIAL/PLATELET
Basophils Absolute: 0.1 10*3/uL (ref 0.0–0.1)
Basophils Relative: 1.1 % (ref 0.0–3.0)
Eosinophils Absolute: 0.2 10*3/uL (ref 0.0–0.7)
Eosinophils Relative: 3 % (ref 0.0–5.0)
HCT: 40.9 % (ref 36.0–46.0)
Hemoglobin: 13.6 g/dL (ref 12.0–15.0)
Lymphocytes Relative: 25.2 % (ref 12.0–46.0)
Lymphs Abs: 1.4 10*3/uL (ref 0.7–4.0)
MCHC: 33.2 g/dL (ref 30.0–36.0)
MCV: 95.4 fL (ref 78.0–100.0)
Monocytes Absolute: 0.6 10*3/uL (ref 0.1–1.0)
Monocytes Relative: 10.3 % (ref 3.0–12.0)
Neutro Abs: 3.5 10*3/uL (ref 1.4–7.7)
Neutrophils Relative %: 60.4 % (ref 43.0–77.0)
Platelets: 267 10*3/uL (ref 150.0–400.0)
RBC: 4.29 Mil/uL (ref 3.87–5.11)
RDW: 13.4 % (ref 11.5–15.5)
WBC: 5.7 10*3/uL (ref 4.0–10.5)

## 2023-11-03 LAB — HEPATIC FUNCTION PANEL
ALT: 20 U/L (ref 0–35)
AST: 25 U/L (ref 0–37)
Albumin: 4.2 g/dL (ref 3.5–5.2)
Alkaline Phosphatase: 60 U/L (ref 39–117)
Bilirubin, Direct: 0.1 mg/dL (ref 0.0–0.3)
Total Bilirubin: 0.6 mg/dL (ref 0.2–1.2)
Total Protein: 7.5 g/dL (ref 6.0–8.3)

## 2023-11-03 LAB — BASIC METABOLIC PANEL
BUN: 19 mg/dL (ref 6–23)
CO2: 28 meq/L (ref 19–32)
Calcium: 9.6 mg/dL (ref 8.4–10.5)
Chloride: 102 meq/L (ref 96–112)
Creatinine, Ser: 0.77 mg/dL (ref 0.40–1.20)
GFR: 79.75 mL/min (ref >60.00)
Glucose, Bld: 87 mg/dL (ref 70–99)
Potassium: 4 meq/L (ref 3.5–5.1)
Sodium: 138 meq/L (ref 135–145)

## 2023-11-03 LAB — TSH: TSH: 1.76 u[IU]/mL (ref 0.35–5.50)

## 2023-11-03 MED ORDER — CEPHALEXIN 500 MG PO CAPS: 500.0000 mg | ORAL_CAPSULE | Freq: Two times a day (BID) | ORAL | 0 refills | Status: AC

## 2023-11-03 MED ORDER — ESCITALOPRAM OXALATE 10 MG PO TABS
ORAL_TABLET | Freq: Every day | ORAL | 1 refills | Status: DC
Start: 2023-11-03 — End: 2024-04-28

## 2023-11-03 NOTE — Telephone Encounter (Signed)
-----   Message from Laymon Priest sent at 11/03/2023  3:44 PM EST ----- Labs look great!  No changes at this time

## 2023-11-03 NOTE — Telephone Encounter (Signed)
 Pt has been notified.

## 2023-11-03 NOTE — Patient Instructions (Addendum)
 Follow up in 6 months to recheck BP We'll notify you of your lab results and make any changes if needed Continue to work on healthy diet and regular exercise- you can do it! START the Cephalexin  twice daily for possible cellulitis If you decide to change your mood meds- let me know! Call with any questions or concerns Stay Safe!  Stay Healthy! ENJOY YOUR TRIP!!!

## 2023-11-03 NOTE — Progress Notes (Signed)
   Subjective:    Patient ID: Virginia Matthews, female    DOB: [birth year], 68 y.o.   MRN: [id number]  HPI HTN- chronic problem, on Lisinopril  20mg  daily w/ good control.  No CP, SOB, HA's, visual changes, edema.  Anxiety/Depression- ongoing issue for pt.  Currently on Lexapro  10mg  daily.  She is under considerable stress w/ husband's declining health.  She is not interested in adjusting medication.  Obesity- pt is up 6 lbs since August.  BMI now 42.82  Has been working with a systems analyst.  Not following a particular diet.  L ankle redness- pt noted tenderness yesterday, today has some redness and warmth.  No obvious break in skin   Review of Systems For ROS see HPI     Objective:   Physical Exam Vitals reviewed.  Constitutional:      General: She is not in acute distress.    Appearance: Normal appearance. She is well-developed. She is obese. She is not ill-appearing.  HENT:     Head: Normocephalic and atraumatic.  Eyes:     Conjunctiva/sclera: Conjunctivae normal.     Pupils: Pupils are equal, round, and reactive to light.  Neck:     Thyroid : No thyromegaly.  Cardiovascular:     Rate and Rhythm: Normal rate and regular rhythm.     Pulses: Normal pulses.     Heart sounds: Normal heart sounds. No murmur heard. Pulmonary:     Effort: Pulmonary effort is normal. No respiratory distress.     Breath sounds: Normal breath sounds.  Abdominal:     General: There is no distension.     Palpations: Abdomen is soft.     Tenderness: There is no abdominal tenderness.  Musculoskeletal:        General: Tenderness (mild TTP over L medial lower leg, just above the ankle) present.     Cervical back: Normal range of motion and neck supple.     Right lower leg: No edema.     Left lower leg: No edema.  Lymphadenopathy:     Cervical: No cervical adenopathy.  Skin:    General: Skin is warm and dry.     Findings: Erythema (over L medial lower leg, just above the ankle) present.   Neurological:     Mental Status: She is alert and oriented to person, place, and time.  Psychiatric:        Mood and Affect: Mood normal.        Behavior: Behavior normal.           Assessment & Plan:   Cellulitis- new.  L ankle.  Mild erythema and TTP.  Start Keflex .  Reviewed supportive care and red flags that should prompt return.  Pt expressed understanding and is in agreement w/ plan.

## 2023-11-03 NOTE — Assessment & Plan Note (Signed)
 Ongoing issue.  Husband's health is rapidly declining and this is understandably difficult.  She does not want to increase Lexapro  as she feels this makes her 'flat'.  Did offer to change meds to something else, but she is hesitant to make changes at this time.  She knows she can reach out if and when she feels changes need to be made.

## 2023-11-03 NOTE — Assessment & Plan Note (Signed)
 Deteriorated.  Pt has gained 6 lbs since August.  She has been working out w/ a Systems analyst but not following a particular diet.  Encouraged low carb diet and continued physical activity.  Will follow.

## 2023-11-03 NOTE — Assessment & Plan Note (Signed)
 Chronic problem.  On Lisinopril  20mg  daily w/ good control.  Currently asymptomatic.  Check labs due to ACE use but no anticipated med changes.

## 2023-11-04 DIAGNOSIS — Z23 Encounter for immunization: Secondary | ICD-10-CM | POA: Diagnosis not present

## 2023-11-10 DIAGNOSIS — H35033 Hypertensive retinopathy, bilateral: Secondary | ICD-10-CM | POA: Diagnosis not present

## 2023-11-10 DIAGNOSIS — H2513 Age-related nuclear cataract, bilateral: Secondary | ICD-10-CM | POA: Diagnosis not present

## 2023-11-10 DIAGNOSIS — I10 Essential (primary) hypertension: Secondary | ICD-10-CM | POA: Diagnosis not present

## 2023-11-19 ENCOUNTER — Encounter: Payer: Self-pay | Admitting: Family Medicine

## 2023-11-22 DIAGNOSIS — L4050 Arthropathic psoriasis, unspecified: Secondary | ICD-10-CM | POA: Diagnosis not present

## 2023-11-23 NOTE — Telephone Encounter (Signed)
 Can you please review pt immunizations to see if there are any due. Pt is traveling to [place] soon.

## 2023-11-25 ENCOUNTER — Ambulatory Visit (INDEPENDENT_AMBULATORY_CARE_PROVIDER_SITE_OTHER): Payer: Medicare Other | Admitting: Family Medicine

## 2023-11-25 ENCOUNTER — Encounter: Payer: Self-pay | Admitting: Family Medicine

## 2023-11-25 VITALS — BP 128/64 | HR 63 | Temp 96.0°F | Ht 66.0 in | Wt 266.0 lb

## 2023-11-25 DIAGNOSIS — Z0184 Encounter for antibody response examination: Secondary | ICD-10-CM

## 2023-11-25 DIAGNOSIS — Z7184 Encounter for health counseling related to travel: Secondary | ICD-10-CM | POA: Diagnosis not present

## 2023-11-25 DIAGNOSIS — Z23 Encounter for immunization: Secondary | ICD-10-CM | POA: Diagnosis not present

## 2023-11-25 NOTE — Progress Notes (Signed)
   Subjective:    Patient ID: Virginia Matthews, female    DOB: 07-15-1956, 68 y.o.   MRN: [id number]  HPI Travel advice- went to [facility] and was told that for her upcoming trip to [place] she would need an updated Tdap, possibly a polio booster.  They already gave her typhoid vaccine.  She is not doing Yellow Fever.  She is personally concerned about measles.  Due for Hep A.     Review of Systems For ROS see HPI     Objective:   Physical Exam Vitals reviewed.  Constitutional:      General: She is not in acute distress.    Appearance: Normal appearance. She is obese. She is not ill-appearing.  HENT:     Head: Normocephalic and atraumatic.  Eyes:     Extraocular Movements: Extraocular movements intact.     Conjunctiva/sclera: Conjunctivae normal.  Cardiovascular:     Rate and Rhythm: Normal rate and regular rhythm.  Pulmonary:     Effort: Pulmonary effort is normal. No respiratory distress.  Skin:    General: Skin is warm and dry.  Neurological:     General: No focal deficit present.     Mental Status: She is alert and oriented to person, place, and time.  Psychiatric:        Mood and Affect: Mood normal.        Behavior: Behavior normal.        Thought Content: Thought content normal.           Assessment & Plan:  Travel Advice Encounter- reviewed recommendations provided by [facility].  Will start Hep A series today and give 2nd dose in 6 months.  She will get Tdap at pharmacy and we will get polio and measles today

## 2023-11-25 NOTE — Patient Instructions (Signed)
 Follow up as needed or as scheduled We'll notify you of your lab results and make any changes if needed Get your Tdap vaccine at the pharmacy Call with any questions or concerns ENJOY YOUR TRIP!!!

## 2023-11-29 ENCOUNTER — Encounter: Payer: Self-pay | Admitting: Family Medicine

## 2023-11-29 ENCOUNTER — Telehealth: Payer: Self-pay

## 2023-11-29 LAB — MEASLES/MUMPS/RUBELLA IMMUNITY
Mumps IgG: 214 [AU]/ml
Rubella: 6.39 {index}
Rubeola IgG: >300 [AU]/ml

## 2023-11-29 LAB — ECHOVIRUS ABS PANEL (CSF)

## 2023-11-29 NOTE — Telephone Encounter (Signed)
 Pt has reviewed results via MyChart.

## 2023-11-29 NOTE — Telephone Encounter (Signed)
-----   Message from Neena Rhymes sent at 11/29/2023  7:41 AM EST ----- You are immune to measles, mumps, and rubella.  This is great news!

## 2023-12-01 DIAGNOSIS — M0579 Rheumatoid arthritis with rheumatoid factor of multiple sites without organ or systems involvement: Secondary | ICD-10-CM | POA: Diagnosis not present

## 2023-12-01 DIAGNOSIS — L4050 Arthropathic psoriasis, unspecified: Secondary | ICD-10-CM | POA: Diagnosis not present

## 2023-12-01 DIAGNOSIS — Z79899 Other long term (current) drug therapy: Secondary | ICD-10-CM | POA: Diagnosis not present

## 2023-12-01 DIAGNOSIS — L409 Psoriasis, unspecified: Secondary | ICD-10-CM | POA: Diagnosis not present

## 2023-12-01 DIAGNOSIS — M1991 Primary osteoarthritis, unspecified site: Secondary | ICD-10-CM | POA: Diagnosis not present

## 2023-12-01 DIAGNOSIS — Z6841 Body Mass Index (BMI) 40.0 and over, adult: Secondary | ICD-10-CM | POA: Diagnosis not present

## 2023-12-01 NOTE — Addendum Note (Signed)
 Addended by: Ester Rink on: 12/01/2023 04:02 PM   Modules accepted: Orders

## 2023-12-03 LAB — POLIOVIRUS (1,3) ABS, NEUTRALIZ.
Polio 1 Titer: >1:128 {titer}
Polio 3 Titer: >1:128 {titer}

## 2023-12-06 ENCOUNTER — Telehealth: Payer: Self-pay

## 2023-12-06 NOTE — Telephone Encounter (Signed)
-----   Message from Neena Rhymes sent at 12/05/2023  4:52 PM EDT ----- Bonita Quin are immune to polio- great news!

## 2023-12-06 NOTE — Telephone Encounter (Signed)
 Pt has been notified.

## 2023-12-08 ENCOUNTER — Other Ambulatory Visit (HOSPITAL_BASED_OUTPATIENT_CLINIC_OR_DEPARTMENT_OTHER): Payer: Self-pay | Admitting: Cardiovascular Disease

## 2023-12-13 ENCOUNTER — Ambulatory Visit: Payer: Medicare Other | Admitting: Orthopaedic Surgery

## 2023-12-27 DIAGNOSIS — M170 Bilateral primary osteoarthritis of knee: Secondary | ICD-10-CM | POA: Diagnosis not present

## 2024-01-05 ENCOUNTER — Other Ambulatory Visit (HOSPITAL_BASED_OUTPATIENT_CLINIC_OR_DEPARTMENT_OTHER): Payer: Self-pay | Admitting: Cardiovascular Disease

## 2024-01-17 DIAGNOSIS — L4050 Arthropathic psoriasis, unspecified: Secondary | ICD-10-CM | POA: Diagnosis not present

## 2024-01-26 ENCOUNTER — Ambulatory Visit (INDEPENDENT_AMBULATORY_CARE_PROVIDER_SITE_OTHER): Payer: Medicare Other | Admitting: Cardiovascular Disease

## 2024-01-26 ENCOUNTER — Encounter (HOSPITAL_BASED_OUTPATIENT_CLINIC_OR_DEPARTMENT_OTHER): Payer: Self-pay | Admitting: Cardiovascular Disease

## 2024-01-26 VITALS — BP 126/82 | HR 57 | Ht 66.0 in | Wt 257.6 lb

## 2024-01-26 DIAGNOSIS — I7121 Aneurysm of the ascending aorta, without rupture: Secondary | ICD-10-CM

## 2024-01-26 DIAGNOSIS — I10 Essential (primary) hypertension: Secondary | ICD-10-CM | POA: Diagnosis not present

## 2024-01-26 DIAGNOSIS — I351 Nonrheumatic aortic (valve) insufficiency: Secondary | ICD-10-CM

## 2024-01-26 NOTE — Patient Instructions (Signed)
 Medication Instructions:  Your physician recommends that you continue on your current medications as directed. Please refer to the Current Medication list given to you today.   *If you need a refill on your cardiac medications before your next appointment, please call your pharmacy*  Lab Work: [facility] IF YOU FIND OUT YOUR INSURANCE COMPANY COVERS   If you have labs (blood work) drawn today and your tests are completely normal, you will receive your results only by: MyChart Message (if you have MyChart) OR A paper copy in the mail If you have any lab test that is abnormal or we need to change your treatment, we will call you to review the results.  Testing/Procedures: Your physician has recommend you to have a coronary calcium score. This is a self pay test that will cost $99  Your physician has requested that you have an echocardiogram. Echocardiography is a painless test that uses sound waves to create images of your heart. It provides your doctor with information about the size and shape of your heart and how well your heart's chambers and valves are working. This procedure takes approximately one hour. There are no restrictions for this procedure. Please do NOT wear cologne, perfume, aftershave, or lotions (deodorant is allowed). Please arrive 15 minutes prior to your appointment time.  Please note: We ask at that you not bring children with you during ultrasound (echo/ vascular) testing. Due to room size and safety concerns, children are not allowed in the ultrasound rooms during exams. Our front office staff cannot provide observation of children in our lobby area while testing is being conducted. An adult accompanying a patient to their appointment will only be allowed in the ultrasound room at the discretion of the ultrasound technician under special circumstances. We apologize for any inconvenience. TO BE DONE IN 1 YEAR   Follow-Up: At [facility], you and your health needs are  our priority.  As part of our continuing mission to provide you with exceptional heart care, our providers are all part of one team.  This team includes your primary Cardiologist (physician) and Advanced Practice Providers or APPs (Physician Assistants and Nurse Practitioners) who all work together to provide you with the care you need, when you need it.  Your next appointment:   WITH DR Nino Bass NP, OR MICHELLE S NP AFTER ECHO IN 1 YEAR   We recommend signing up for the patient portal called "MyChart".  Sign up information is provided on this After Visit Summary.  MyChart is used to connect with patients for Virtual Visits (Telemedicine).  Patients are able to view lab/test results, encounter notes, upcoming appointments, etc.  Non-urgent messages can be sent to your provider as well.   To learn more about what you can do with MyChart, go to [email or web address].

## 2024-01-26 NOTE — Progress Notes (Signed)
 Cardiology Office Note:  .    Date:  01/26/2024  ID:  Virginia Matthews, Virginia Matthews [birth year], MRN [id number] PCP: Jess Morita, MD   [facility] Providers Cardiologist:  Maudine Sos, MD     History of Present Illness: .    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  Virginia Matthews initially presented 12/2015 with atypical chest pain that occurred in stressful situations.  She also has a family history of premature CAD.  Therefore she was referred for cardiac CT angiography and coronary calcium scoring.  Her coronary calcium score was 0 and there were no obstructive lesions.  It showed a dilated pulmonary artery suggestive of pulmonary hypertension and her ascending thoracic aortic was 4.2 cm.  She was referred for echocardiography that revealed PASP 37 mmHg and was otherwise unremarkable.   She had a repeat echo 01/2020 that revealed LVEF 60 to 65% with normal diastolic function.  Her aorta was 4.4 cm but she had moderate aortic regurgitation.  She followed up with Ervin Heath, PA, on 07/2016 for palpitations.  She wore a 24-hour Holter 08/2016 that revealed frequent PVCs and occasional PACs.  She was started on metoprolol .   Metoprolol  was stopped because her palpitations were better controlled.  She was started on lisinopril  due to poorly controlled blood pressure. She had a repeat echo 01/2021 that revealed LVEF 60 to 65% with mild LVH.  The ascending aorta was 4.4 cm. Her last echo 08/2022 was stable. Ascending aorta was 4.1cm.  At her visit 08/2022 she was doing well.  She was not interested in GLP-1 inhibitors.  Echo 08/2023 revealed LVEF 60-65% with normal diastolic function.  Ascending aorta was 4.1 cm, stable from prior.   Discussed the use of AI scribe software for clinical note transcription with the patient, who gave verbal consent to proceed.  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 She is planning to undergo knee replacement surgery in the fall. Despite the knee pain, she has been trying to increase her walking and engages in weight training at least twice a week. She notes some difficulty with walking and experiences shortness of breath when walking uphill, but otherwise feels fine.  Her blood pressure is generally well-controlled at home, with readings ranging from 110/70 to 120/70 mmHg. She is currently taking lisinopril  for hypertension. She has a strong family history of heart disease, including her father, brother, and all of her father's brothers.  Her cholesterol levels were checked in January and were reported to be good. A calcium score from 2017 was zero, indicating a low risk of heart disease. She does not have diabetes and her only risk factors are family history and blood pressure.  She recently returned from a trip to [facility] and is planning a trip to [place] in three weeks, visiting [address], [place], and [place].   ROS:  As per HPI  Studies Reviewed: Aaron Aas   EKG Interpretation Date/Time:  Wednesday January 26 2024 09:00:59 EDT Ventricular Rate:  57 PR Interval:  174 QRS Duration:  108 QT Interval:  408 QTC Calculation: 397 R Axis:   10  Text Interpretation: Sinus bradycardia When compared with ECG of 15-Jul-2011 07:41, No significant change was found Confirmed by Maudine Sos ([id number]) on 01/26/2024 9:05:44 AM   Echo 09/10/2023: 1. Left ventricular ejection  fraction, by estimation, is 60 to 65%. The  left ventricle has normal function. The left ventricle has no regional  wall motion abnormalities. Left ventricular diastolic parameters were  normal. The average left ventricular  global longitudinal strain is -16.1 %. The global longitudinal strain is  normal.   2. Right ventricular systolic function is normal. The right ventricular  size is  normal.   3. Left atrial size was moderately dilated.   4. The mitral valve is normal in structure. Trivial mitral valve  regurgitation. No evidence of mitral stenosis.   5. The aortic valve is tricuspid. There is moderate calcification of the  aortic valve. There is moderate thickening of the aortic valve. Aortic  valve regurgitation is mild to moderate. Mild aortic valve stenosis.   6. Aortic dilatation noted. There is mild dilatation of the ascending  aorta, measuring 41 mm.   7. The inferior vena cava is normal in size with greater than 50%  respiratory variability, suggesting right atrial pressure of 3 mmHg.   Risk Assessment/Calculations:            Physical Exam:   VS:  BP 126/82   Pulse (!) 57   Ht 5\' 6"  (1.676 m)   Wt 257 lb 9.6 oz (116.8 kg)   SpO2 96%   BMI 41.58 kg/m  , BMI Body mass index is 41.58 kg/m. GENERAL:  Well appearing HEENT: Pupils equal round and reactive, fundi not visualized, oral mucosa unremarkable NECK:  No jugular venous distention, waveform within normal limits, carotid upstroke brisk and symmetric, no bruits, no thyromegaly LUNGS:  Clear to auscultation bilaterally HEART:  RRR.  PMI not displaced or sustained,S1 and S2 within normal limits, no S3, no S4, no clicks, no rubs, no murmurs ABD:  Flat, positive bowel sounds normal in frequency in pitch, no bruits, no rebound, no guarding, no midline pulsatile mass, no hepatomegaly, no splenomegaly EXT:  2 plus pulses throughout, no edema, no cyanosis no clubbing SKIN:  No rashes no nodules NEURO:  Cranial nerves II through XII grossly intact, motor grossly intact throughout PSYCH:  Cognitively intact, oriented to person place and time   ASSESSMENT AND PLAN: .    Assessment & Plan # Family history of ischemic heart disease Strong family history of ischemic heart disease. Previous calcium score was zero, indicating low risk. Monitoring necessary due to family history. - Order CT scan for calcium score  at her convenience. - Order LP(a) blood test to assess genetic risk for heart disease. - Schedule follow-up echocardiogram in one year.  # Hypertension Blood pressure well-controlled with lisinopril . Home readings range from 110/70s to 120/70s. - Continue lisinopril  for blood pressure management.  # Morbid obesity: She is doing a good job with weight loss and trying to exercise.  Her goal is BMI <40 for knee surgery.  Not interested in SLP1s.   Dispo: f/u 1 year  Signed, Maudine Sos, MD

## 2024-02-07 ENCOUNTER — Other Ambulatory Visit (HOSPITAL_BASED_OUTPATIENT_CLINIC_OR_DEPARTMENT_OTHER): Payer: Self-pay | Admitting: Cardiovascular Disease

## 2024-03-13 ENCOUNTER — Ambulatory Visit (INDEPENDENT_AMBULATORY_CARE_PROVIDER_SITE_OTHER): Admitting: Family Medicine

## 2024-03-13 ENCOUNTER — Ambulatory Visit (HOSPITAL_BASED_OUTPATIENT_CLINIC_OR_DEPARTMENT_OTHER)
Admission: RE | Admit: 2024-03-13 | Discharge: 2024-03-13 | Disposition: A | Discharge status: Home / Self Care | Source: Ambulatory Visit | Attending: Family Medicine | Admitting: Family Medicine

## 2024-03-13 ENCOUNTER — Encounter: Payer: Self-pay | Admitting: Family Medicine

## 2024-03-13 ENCOUNTER — Other Ambulatory Visit: Payer: Self-pay | Admitting: Family Medicine

## 2024-03-13 VITALS — BP 118/62 | HR 61 | Temp 98.1°F | Ht 66.0 in | Wt 255.1 lb

## 2024-03-13 DIAGNOSIS — R2241 Localized swelling, mass and lump, right lower limb: Secondary | ICD-10-CM | POA: Diagnosis not present

## 2024-03-13 DIAGNOSIS — M7989 Other specified soft tissue disorders: Secondary | ICD-10-CM | POA: Diagnosis not present

## 2024-03-13 DIAGNOSIS — Z79899 Other long term (current) drug therapy: Secondary | ICD-10-CM | POA: Insufficient documentation

## 2024-03-13 DIAGNOSIS — I8391 Asymptomatic varicose veins of right lower extremity: Secondary | ICD-10-CM | POA: Diagnosis not present

## 2024-03-13 DIAGNOSIS — M79651 Pain in right thigh: Secondary | ICD-10-CM | POA: Diagnosis not present

## 2024-03-13 DIAGNOSIS — L4050 Arthropathic psoriasis, unspecified: Secondary | ICD-10-CM | POA: Diagnosis not present

## 2024-03-13 DIAGNOSIS — M170 Bilateral primary osteoarthritis of knee: Secondary | ICD-10-CM | POA: Insufficient documentation

## 2024-03-13 DIAGNOSIS — M79661 Pain in right lower leg: Secondary | ICD-10-CM | POA: Diagnosis not present

## 2024-03-13 DIAGNOSIS — M7071 Other bursitis of hip, right hip: Secondary | ICD-10-CM | POA: Insufficient documentation

## 2024-03-13 DIAGNOSIS — Z1231 Encounter for screening mammogram for malignant neoplasm of breast: Secondary | ICD-10-CM

## 2024-03-13 NOTE — Progress Notes (Signed)
   Subjective:    Patient ID: Virginia Matthews, female    DOB: [birth year], 68 y.o.   MRN: [id number]  HPI Leg pain- pt reports she 'banged it pretty hard' 10 days ago.  Had flown back from [place] the day before.  Reports she developed a knot on her lateral lower R leg but then felt that the surrounding areas got 'hard and knotty'.  + pain 'along vein track'  Took Naprosyn  yesterday w/ some relief.   Review of Systems For ROS see HPI     Objective:   Physical Exam Vitals reviewed.  Constitutional:      General: She is not in acute distress.    Appearance: Normal appearance. She is obese. She is not ill-appearing.  HENT:     Head: Normocephalic and atraumatic.   Cardiovascular:     Pulses: Normal pulses.   Musculoskeletal:        General: Swelling (cord like swelling/induration starting R posterolateral calf, traveling behind knee, and up into posterior thigh concerning for DVT) and tenderness present.   Skin:    General: Skin is warm and dry.     Findings: No erythema.   Neurological:     General: No focal deficit present.     Mental Status: She is alert and oriented to person, place, and time.           Assessment & Plan:  Pain and swelling of R lower leg- new.  Pt recently returned from [place] and the next day banged her leg in the garage while moving her husband out of assisted living.  Since then, she has had pain and swelling that seems to follow a vein path.  Concern for DVT.  Get stat US  to assess.  Reviewed supportive care and red flags that should prompt return.  Pt expressed understanding and is in agreement w/ plan.

## 2024-03-13 NOTE — Patient Instructions (Signed)
 We will determine the next steps once we have your US  results Continue to take anti-inflammatories (NSAIDs) for pain relief HEAT the painful areas of your leg Call with any questions or concerns Hang In There!!

## 2024-03-14 ENCOUNTER — Ambulatory Visit: Payer: Self-pay | Admitting: Family Medicine

## 2024-03-14 NOTE — Progress Notes (Signed)
 LVM to call office about results

## 2024-03-27 ENCOUNTER — Ambulatory Visit (HOSPITAL_BASED_OUTPATIENT_CLINIC_OR_DEPARTMENT_OTHER)
Admission: RE | Admit: 2024-03-27 | Discharge: 2024-03-27 | Disposition: A | Discharge status: Home / Self Care | Payer: Self-pay | Source: Ambulatory Visit | Attending: Cardiovascular Disease | Admitting: Cardiovascular Disease

## 2024-03-27 DIAGNOSIS — I10 Essential (primary) hypertension: Secondary | ICD-10-CM | POA: Insufficient documentation

## 2024-03-29 ENCOUNTER — Ambulatory Visit: Payer: Self-pay | Admitting: Cardiovascular Disease

## 2024-03-29 DIAGNOSIS — Z5181 Encounter for therapeutic drug level monitoring: Secondary | ICD-10-CM

## 2024-03-29 DIAGNOSIS — E785 Hyperlipidemia, unspecified: Secondary | ICD-10-CM

## 2024-03-30 MED ORDER — ROSUVASTATIN CALCIUM 10 MG PO TABS: 10.0000 mg | ORAL_TABLET | Freq: Every day | ORAL | 3 refills | Status: AC

## 2024-04-04 ENCOUNTER — Other Ambulatory Visit: Payer: Self-pay

## 2024-04-04 DIAGNOSIS — Z780 Asymptomatic menopausal state: Secondary | ICD-10-CM

## 2024-04-04 DIAGNOSIS — Z13820 Encounter for screening for osteoporosis: Secondary | ICD-10-CM

## 2024-04-10 ENCOUNTER — Ambulatory Visit
Admission: RE | Admit: 2024-04-10 | Discharge: 2024-04-10 | Disposition: A | Discharge status: Home / Self Care | Source: Ambulatory Visit

## 2024-04-10 DIAGNOSIS — Z1231 Encounter for screening mammogram for malignant neoplasm of breast: Secondary | ICD-10-CM

## 2024-04-28 ENCOUNTER — Telehealth: Payer: Self-pay | Admitting: Family Medicine

## 2024-04-28 ENCOUNTER — Telehealth: Payer: Self-pay

## 2024-04-28 ENCOUNTER — Other Ambulatory Visit: Payer: Self-pay

## 2024-04-28 DIAGNOSIS — F32A Depression, unspecified: Secondary | ICD-10-CM

## 2024-04-28 MED ORDER — ESCITALOPRAM OXALATE 10 MG PO TABS: ORAL_TABLET | Freq: Every day | ORAL | 1 refills | Status: AC

## 2024-04-28 NOTE — Telephone Encounter (Signed)
 Type of form received: Surgical Clearance  Additional comments:   Received by: Fax  Form should be Faxed/mailed to: (address/ fax #) [phone]  Is patient requesting call for pickup: N/A  Form placed:  Labeled & placed in provider bin  Attach charge sheet.  Provider will determine charge.  Individual made aware of 3-5 business day turn around? N/A

## 2024-04-28 NOTE — Telephone Encounter (Signed)
 Obtained documents from front desk. Placed in providers folder at nurse station POD B.

## 2024-04-28 NOTE — Telephone Encounter (Signed)
   Pre-operative Risk Assessment    Patient Name: Virginia Matthews  DOB: March 08, 1956 MRN: [id number]   Date of last office visit: 01/26/24 [facility] [place], [place] Date of next office visit: NONE   Request for Surgical Clearance    Procedure:  RIGHT TOTAL KNEE ARTHROPLASTY  Date of Surgery:  Clearance 08/01/24                                Surgeon:  DR DEMPSEY MOAN Surgeon's Group or Practice Name:  [facility] Phone number:  [phone] Fax number:  [phone]   ATTN: KERRI MAZE   Type of Clearance Requested:   - Medical    Type of Anesthesia:  CHOICE   Additional requests/questions:    SignedLucie DELENA Ku   04/28/2024, 4:49 PM

## 2024-05-01 ENCOUNTER — Telehealth: Payer: Self-pay | Admitting: *Deleted

## 2024-05-01 NOTE — Telephone Encounter (Signed)
   Name: Virginia Matthews  DOB: 07/17/1956  MRN: [id number]  Primary Cardiologist: Annabella Scarce, MD   Preoperative team, please contact this patient and set up a phone call appointment for further preoperative risk assessment. Please obtain consent and complete medication review. Thank you for your help.  I confirm that guidance regarding antiplatelet and oral anticoagulation therapy has been completed and, if necessary, noted below.  None requested  I also confirmed the patient resides in the state of  . As per [facility] Medical Board telemedicine laws, the patient must reside in the state in which the provider is licensed.   Josefa CHRISTELLA Beauvais, NP 05/01/2024, 8:20 AM [facility]

## 2024-05-01 NOTE — Telephone Encounter (Signed)
 Pt scheduled for a tele visit 07/17/24.  Consent on file / medications reconciled.

## 2024-05-01 NOTE — Telephone Encounter (Signed)
 Pt scheduled for a tele visit 07/17/24.  Consent on file / medications reconciled.    Patient Consent for Virtual Visit        [name] has provided verbal consent on 05/01/2024 for a virtual visit (video or telephone).   CONSENT FOR VIRTUAL VISIT FOR:  Virginia Matthews  By participating in this virtual visit I agree to the following:  I hereby voluntarily request, consent and authorize [facility] and its employed or contracted physicians, physician assistants, nurse practitioners or other licensed health care professionals (the Practitioner), to provide me with telemedicine health care services (the "Services) as deemed necessary by the treating Practitioner. I acknowledge and consent to receive the Services by the Practitioner via telemedicine. I understand that the telemedicine visit will involve communicating with the Practitioner through live audiovisual communication technology and the disclosure of certain medical information by electronic transmission. I acknowledge that I have been given the opportunity to request an in-person assessment or other available alternative prior to the telemedicine visit and am voluntarily participating in the telemedicine visit.  I understand that I have the right to withhold or withdraw my consent to the use of telemedicine in the course of my care at any time, without affecting my right to future care or treatment, and that the Practitioner or I may terminate the telemedicine visit at any time. I understand that I have the right to inspect all information obtained and/or recorded in the course of the telemedicine visit and may receive copies of available information for a reasonable fee.  I understand that some of the potential risks of receiving the Services via telemedicine include:  Delay or interruption in medical evaluation due to technological equipment failure or disruption; Information transmitted may not be sufficient (e.g. poor  resolution of images) to allow for appropriate medical decision making by the Practitioner; and/or  In rare instances, security protocols could fail, causing a breach of personal health information.  Furthermore, I acknowledge that it is my responsibility to provide information about my medical history, conditions and care that is complete and accurate to the best of my ability. I acknowledge that Practitioner's advice, recommendations, and/or decision may be based on factors not within their control, such as incomplete or inaccurate data provided by me or distortions of diagnostic images or specimens that may result from electronic transmissions. I understand that the practice of medicine is not an exact science and that Practitioner makes no warranties or guarantees regarding treatment outcomes. I acknowledge that a copy of this consent can be made available to me via my patient portal [facility] MyChart), or I can request a printed copy by calling the office of [facility].    I understand that my insurance will be billed for this visit.   I have read or had this consent read to me. I understand the contents of this consent, which adequately explains the benefits and risks of the Services being provided via telemedicine.  I have been provided ample opportunity to ask questions regarding this consent and the Services and have had my questions answered to my satisfaction. I give my informed consent for the services to be provided through the use of telemedicine in my medical care

## 2024-05-01 NOTE — Telephone Encounter (Signed)
 This patient has an appt coming up on 8/6 for a blood pressure follow up. She also has surgical clearance forms that need to be completed. Would you like this appt to be scheduled separately or do you feel this can be completed at that appt?

## 2024-05-03 ENCOUNTER — Ambulatory Visit (INDEPENDENT_AMBULATORY_CARE_PROVIDER_SITE_OTHER): Payer: Medicare Other | Admitting: Family Medicine

## 2024-05-03 ENCOUNTER — Encounter: Payer: Self-pay | Admitting: Family Medicine

## 2024-05-03 VITALS — BP 108/70 | HR 58 | Temp 98.0°F | Ht 66.0 in | Wt 263.0 lb

## 2024-05-03 DIAGNOSIS — I10 Essential (primary) hypertension: Secondary | ICD-10-CM | POA: Diagnosis not present

## 2024-05-03 DIAGNOSIS — E785 Hyperlipidemia, unspecified: Secondary | ICD-10-CM

## 2024-05-03 DIAGNOSIS — N393 Stress incontinence (female) (male): Secondary | ICD-10-CM | POA: Diagnosis not present

## 2024-05-03 DIAGNOSIS — F32A Depression, unspecified: Secondary | ICD-10-CM | POA: Diagnosis not present

## 2024-05-03 DIAGNOSIS — F419 Anxiety disorder, unspecified: Secondary | ICD-10-CM

## 2024-05-03 LAB — HEPATIC FUNCTION PANEL
ALT: 16 U/L (ref 0–35)
AST: 18 U/L (ref 0–37)
Albumin: 4.1 g/dL (ref 3.5–5.2)
Alkaline Phosphatase: 67 U/L (ref 39–117)
Bilirubin, Direct: 0.1 mg/dL (ref 0.0–0.3)
Total Bilirubin: 0.4 mg/dL (ref 0.2–1.2)
Total Protein: 7.7 g/dL (ref 6.0–8.3)

## 2024-05-03 LAB — BASIC METABOLIC PANEL WITH GFR
BUN: 19 mg/dL (ref 6–23)
CO2: 31 meq/L (ref 19–32)
Calcium: 9.6 mg/dL (ref 8.4–10.5)
Chloride: 102 meq/L (ref 96–112)
Creatinine, Ser: 0.75 mg/dL (ref 0.40–1.20)
GFR: 82.02 mL/min (ref >60.00)
Glucose, Bld: 86 mg/dL (ref 70–99)
Potassium: 4.3 meq/L (ref 3.5–5.1)
Sodium: 138 meq/L (ref 135–145)

## 2024-05-03 LAB — LIPID PANEL
Cholesterol: 160 mg/dL (ref 0–200)
HDL: 67.8 mg/dL (ref >39.00)
LDL Cholesterol: 78 mg/dL (ref 0–99)
NonHDL: 91.94
Total CHOL/HDL Ratio: 2
Triglycerides: 68 mg/dL (ref 0.0–149.0)
VLDL: 13.6 mg/dL (ref 0.0–40.0)

## 2024-05-03 LAB — CBC WITH DIFFERENTIAL/PLATELET
Basophils Absolute: 0.1 K/uL (ref 0.0–0.1)
Basophils Relative: 1 % (ref 0.0–3.0)
Eosinophils Absolute: 0.3 K/uL (ref 0.0–0.7)
Eosinophils Relative: 5.1 % — ABNORMAL HIGH (ref 0.0–5.0)
HCT: 40 % (ref 36.0–46.0)
Hemoglobin: 13.2 g/dL (ref 12.0–15.0)
Lymphocytes Relative: 32.7 % (ref 12.0–46.0)
Lymphs Abs: 1.8 K/uL (ref 0.7–4.0)
MCHC: 33.1 g/dL (ref 30.0–36.0)
MCV: 92.3 fl (ref 78.0–100.0)
Monocytes Absolute: 0.6 K/uL (ref 0.1–1.0)
Monocytes Relative: 10.6 % (ref 3.0–12.0)
Neutro Abs: 2.8 K/uL (ref 1.4–7.7)
Neutrophils Relative %: 50.6 % (ref 43.0–77.0)
Platelets: 251 K/uL (ref 150.0–400.0)
RBC: 4.33 Mil/uL (ref 3.87–5.11)
RDW: 13.9 % (ref 11.5–15.5)
WBC: 5.5 K/uL (ref 4.0–10.5)

## 2024-05-03 LAB — TSH: TSH: 1.86 u[IU]/mL (ref 0.35–5.50)

## 2024-05-03 MED ORDER — CLONAZEPAM 0.5 MG PO TABS: 0.5000 mg | ORAL_TABLET | Freq: Every day | ORAL | 1 refills | Status: AC

## 2024-05-03 NOTE — Patient Instructions (Signed)
 Follow up in 6 months to recheck blood pressure, cholesterol We'll notify you of your lab results and make any changes if needed Keep up the good work on healthy diet and regular exercise- you can do it! Continue to work on the kegel exercises START the Clonazepam  nightly for stress and sleep Call with any questions or concerns Stay Safe!  Stay Healthy! Hang in there!!!

## 2024-05-03 NOTE — Progress Notes (Signed)
   Subjective:    Patient ID: Virginia Matthews, female    DOB: 1956-09-11, 68 y.o.   MRN: [id number]  HPI HTN- chronic problem, on Lisinopril  20mg  daily w/ good control.  No CP, SOB above baseline, HA's, visual changes, edema.  Hyperlipidemia- chronic problem, on Crestor  10mg  daily.  No abd pain, N/V.  Obesity- pt has gained 8 lbs since June.  BMI now 42.45  Started swimming last week.  Is doing strength training.  Caregiver stress/anxiety- pt reports having a hard time sleeping due to racing thoughts.  Trazodone  was not helpful due to severe dry mouth.  Stress incontinence- recent development.  Is trying to do Kegel exercises.     Review of Systems For ROS see HPI     Objective:   Physical Exam Vitals reviewed.  Constitutional:      General: She is not in acute distress.    Appearance: Normal appearance. She is well-developed. She is obese. She is not ill-appearing.  HENT:     Head: Normocephalic and atraumatic.  Eyes:     Conjunctiva/sclera: Conjunctivae normal.     Pupils: Pupils are equal, round, and reactive to light.  Neck:     Thyroid : No thyromegaly.  Cardiovascular:     Rate and Rhythm: Normal rate and regular rhythm.     Pulses: Normal pulses.     Heart sounds: Normal heart sounds. No murmur heard. Pulmonary:     Effort: Pulmonary effort is normal. No respiratory distress.     Breath sounds: Normal breath sounds.  Abdominal:     General: There is no distension.     Palpations: Abdomen is soft.     Tenderness: There is no abdominal tenderness.  Musculoskeletal:     Cervical back: Normal range of motion and neck supple.     Right lower leg: No edema.     Left lower leg: No edema.  Lymphadenopathy:     Cervical: No cervical adenopathy.  Skin:    General: Skin is warm and dry.  Neurological:     General: No focal deficit present.     Mental Status: She is alert and oriented to person, place, and time.  Psychiatric:        Mood and Affect: Mood normal.         Behavior: Behavior normal.        Thought Content: Thought content normal.           Assessment & Plan:

## 2024-05-03 NOTE — Assessment & Plan Note (Signed)
 Deteriorated.  Has gained 8 lbs since June.  BMI 42.45.  She started swimming just last week and has added strength training.  Applauded her efforts.

## 2024-05-03 NOTE — Assessment & Plan Note (Signed)
 Chronic problem.  On Lisinopril  20mg  daily w/ good control.  Currently asymptomatic.  Check labs due to ACE use but no anticipated med changes.

## 2024-05-03 NOTE — Assessment & Plan Note (Signed)
 Chronic problem.  On Crestor 10mg  daily w/o difficulty.  Check labs.  Adjust meds prn

## 2024-05-03 NOTE — Assessment & Plan Note (Signed)
 New.  Encouraged pt to continue Kegels regularly.

## 2024-05-03 NOTE — Assessment & Plan Note (Signed)
 Deteriorated.  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  As his health continues to worsen, her stress climbs.  Sleep is the most difficult part right now.  Did not do well on Trazodone .  Will start low dose Clonazepam  and monitor closely.  Pt expressed understanding and is in agreement w/ plan.

## 2024-05-04 ENCOUNTER — Ambulatory Visit: Payer: Self-pay | Admitting: Family Medicine

## 2024-05-05 NOTE — Telephone Encounter (Signed)
 Form completed and returned to [employer]

## 2024-05-08 DIAGNOSIS — L4050 Arthropathic psoriasis, unspecified: Secondary | ICD-10-CM | POA: Diagnosis not present

## 2024-05-08 NOTE — Telephone Encounter (Signed)
Faxed back with progress notes.

## 2024-05-16 ENCOUNTER — Ambulatory Visit

## 2024-05-17 ENCOUNTER — Telehealth: Payer: Self-pay

## 2024-05-17 NOTE — Telephone Encounter (Signed)
 2nd request received. Procedure date is listed as 08/08/24  Pt is scheduled for TELE Preop appt 07/17/24

## 2024-05-17 NOTE — Telephone Encounter (Signed)
 Type of form received: surgical clearance [facility]  Additional comments:   Received by: Fax  Form should be Faxed/mailed to: [phone]  Is patient requesting call for pickup: No  Form placed:  POD A sign folder   Attach charge sheet.  Provider will determine charge.  Individual made aware of 3-5 business day turn around No?  Called patient to schedule surgical clearance appt, no answer, LM to call back

## 2024-05-18 NOTE — Telephone Encounter (Signed)
 Faxed forms to [facility]. Placed in provider folder up front for scanning

## 2024-05-18 NOTE — Telephone Encounter (Signed)
 Form completed and returned to [place]

## 2024-06-01 DIAGNOSIS — L409 Psoriasis, unspecified: Secondary | ICD-10-CM | POA: Diagnosis not present

## 2024-06-01 DIAGNOSIS — Z6841 Body Mass Index (BMI) 40.0 and over, adult: Secondary | ICD-10-CM | POA: Diagnosis not present

## 2024-06-01 DIAGNOSIS — M1991 Primary osteoarthritis, unspecified site: Secondary | ICD-10-CM | POA: Diagnosis not present

## 2024-06-01 DIAGNOSIS — M0579 Rheumatoid arthritis with rheumatoid factor of multiple sites without organ or systems involvement: Secondary | ICD-10-CM | POA: Diagnosis not present

## 2024-06-01 DIAGNOSIS — L4050 Arthropathic psoriasis, unspecified: Secondary | ICD-10-CM | POA: Diagnosis not present

## 2024-06-01 DIAGNOSIS — Z79899 Other long term (current) drug therapy: Secondary | ICD-10-CM | POA: Diagnosis not present

## 2024-06-07 ENCOUNTER — Ambulatory Visit

## 2024-06-15 DIAGNOSIS — M170 Bilateral primary osteoarthritis of knee: Secondary | ICD-10-CM | POA: Diagnosis not present

## 2024-06-26 ENCOUNTER — Other Ambulatory Visit (HOSPITAL_BASED_OUTPATIENT_CLINIC_OR_DEPARTMENT_OTHER)

## 2024-06-28 ENCOUNTER — Ambulatory Visit

## 2024-07-03 DIAGNOSIS — Z111 Encounter for screening for respiratory tuberculosis: Secondary | ICD-10-CM | POA: Diagnosis not present

## 2024-07-03 DIAGNOSIS — L4050 Arthropathic psoriasis, unspecified: Secondary | ICD-10-CM | POA: Diagnosis not present

## 2024-07-03 DIAGNOSIS — R5383 Other fatigue: Secondary | ICD-10-CM | POA: Diagnosis not present

## 2024-07-03 DIAGNOSIS — Z79899 Other long term (current) drug therapy: Secondary | ICD-10-CM | POA: Diagnosis not present

## 2024-07-03 DIAGNOSIS — F4323 Adjustment disorder with mixed anxiety and depressed mood: Secondary | ICD-10-CM | POA: Diagnosis not present

## 2024-07-07 ENCOUNTER — Telehealth: Payer: Self-pay | Admitting: Family Medicine

## 2024-07-07 NOTE — Telephone Encounter (Signed)
 SURGEON'S OFFICE RESENT FAX WITH THE NEW DATE OF SURGERY IS 10/03/2024.

## 2024-07-07 NOTE — Telephone Encounter (Signed)
 Type of form received: Surgical Clearance  Additional comments: Pre-Op Scheduled with Cardiology Specialist on 10/20  Received by: Fax  Form should be Faxed/mailed to: (address/ fax #) [phone]  Is patient requesting call for pickup: N/A  Form placed:  Labeled & placed in provider bin  Attach charge sheet.  Provider will determine charge.  Individual made aware of 3-5 business day turn around? N/A

## 2024-07-10 DIAGNOSIS — F4323 Adjustment disorder with mixed anxiety and depressed mood: Secondary | ICD-10-CM | POA: Diagnosis not present

## 2024-07-10 NOTE — Telephone Encounter (Signed)
 Obtained documents from front desk  Placed in providers folder for review at nurse station

## 2024-07-13 DIAGNOSIS — Z23 Encounter for immunization: Secondary | ICD-10-CM | POA: Diagnosis not present

## 2024-07-17 ENCOUNTER — Ambulatory Visit: Attending: Cardiovascular Disease

## 2024-07-17 DIAGNOSIS — Z01810 Encounter for preprocedural cardiovascular examination: Secondary | ICD-10-CM

## 2024-07-17 NOTE — Telephone Encounter (Signed)
 Please see note from televisit today, patient has elected to move her preoperative televisit to early December, 2025 as her procedure has been moved to January 2026.  Please call patient to reschedule her appointment, can cancel appointment from today.  Thank you!

## 2024-07-17 NOTE — Progress Notes (Signed)
   Patient Name: Virginia Matthews  DOB: [birth year] MRN: [id number]  Primary Cardiologist: Annabella Scarce, MD  Chart reviewed as part of pre-operative protocol coverage.  Patient was last in clinic on 01/26/2024, she had remained stable from a cardiac standpoint.  Patient is now pending right total knee arthroplasty, her procedure has been moved to January 2025.  Discussed with patient delaying preoperative cardiac evaluation via televisit until beginning of December, patient in agreement with plan.  Will route this note to preoperative callback team to reschedule patient in December 2025.  Thank you, Lexine Jaspers D Demyan Fugate, NP 07/17/2024, 9:24 AM

## 2024-07-19 DIAGNOSIS — F4323 Adjustment disorder with mixed anxiety and depressed mood: Secondary | ICD-10-CM | POA: Diagnosis not present

## 2024-07-24 NOTE — Telephone Encounter (Signed)
 Faxed and placed in scan bin

## 2024-07-24 NOTE — Telephone Encounter (Signed)
 Form completed and returned to [place]

## 2024-07-31 ENCOUNTER — Encounter: Payer: Self-pay | Admitting: Radiology

## 2024-08-02 DIAGNOSIS — F4323 Adjustment disorder with mixed anxiety and depressed mood: Secondary | ICD-10-CM | POA: Diagnosis not present

## 2024-08-17 DIAGNOSIS — F4323 Adjustment disorder with mixed anxiety and depressed mood: Secondary | ICD-10-CM | POA: Diagnosis not present

## 2024-08-28 ENCOUNTER — Ambulatory Visit (HOSPITAL_BASED_OUTPATIENT_CLINIC_OR_DEPARTMENT_OTHER)
Admission: RE | Admit: 2024-08-28 | Discharge: 2024-08-28 | Disposition: A | Source: Ambulatory Visit | Attending: Family Medicine | Admitting: Family Medicine

## 2024-08-28 DIAGNOSIS — Z780 Asymptomatic menopausal state: Secondary | ICD-10-CM | POA: Diagnosis not present

## 2024-08-28 DIAGNOSIS — Z79899 Other long term (current) drug therapy: Secondary | ICD-10-CM | POA: Diagnosis not present

## 2024-08-28 DIAGNOSIS — Z13820 Encounter for screening for osteoporosis: Secondary | ICD-10-CM | POA: Insufficient documentation

## 2024-08-28 DIAGNOSIS — M85852 Other specified disorders of bone density and structure, left thigh: Secondary | ICD-10-CM | POA: Diagnosis not present

## 2024-08-28 DIAGNOSIS — L4050 Arthropathic psoriasis, unspecified: Secondary | ICD-10-CM | POA: Diagnosis not present

## 2024-08-29 ENCOUNTER — Telehealth (HOSPITAL_BASED_OUTPATIENT_CLINIC_OR_DEPARTMENT_OTHER): Payer: Self-pay | Admitting: *Deleted

## 2024-08-29 ENCOUNTER — Ambulatory Visit: Payer: Self-pay | Admitting: Family Medicine

## 2024-08-29 NOTE — Progress Notes (Signed)
Called patient and relayed information below.

## 2024-08-29 NOTE — Telephone Encounter (Signed)
 S/w the pt and she tells me that she has moved her surgery to Jan 2026. Pt needs to schedule her tele preop appt early Dec 2025. Pt has been scheduled 09/05/24. Med rec and consent are done.

## 2024-08-29 NOTE — Telephone Encounter (Signed)
 S/w the pt and she tells me that she has moved her surgery to Jan 2026. Pt needs to schedule her tele preop appt early Dec 2025. Pt has been scheduled 09/05/24. Med rec and consent are done.       Patient Consent for Virtual Visit        ROSEANNE JUENGER has provided verbal consent on 08/29/2024 for a virtual visit (video or telephone).   CONSENT FOR VIRTUAL VISIT FOR:  JAQUAY MORNEAULT  By participating in this virtual visit I agree to the following:  I hereby voluntarily request, consent and authorize [facility] and its employed or contracted physicians, physician assistants, nurse practitioners or other licensed health care professionals (the Practitioner), to provide me with telemedicine health care services (the "Services) as deemed necessary by the treating Practitioner. I acknowledge and consent to receive the Services by the Practitioner via telemedicine. I understand that the telemedicine visit will involve communicating with the Practitioner through live audiovisual communication technology and the disclosure of certain medical information by electronic transmission. I acknowledge that I have been given the opportunity to request an in-person assessment or other available alternative prior to the telemedicine visit and am voluntarily participating in the telemedicine visit.  I understand that I have the right to withhold or withdraw my consent to the use of telemedicine in the course of my care at any time, without affecting my right to future care or treatment, and that the Practitioner or I may terminate the telemedicine visit at any time. I understand that I have the right to inspect all information obtained and/or recorded in the course of the telemedicine visit and may receive copies of available information for a reasonable fee.  I understand that some of the potential risks of receiving the Services via telemedicine include:  Delay or interruption in medical evaluation due  to technological equipment failure or disruption; Information transmitted may not be sufficient (e.g. poor resolution of images) to allow for appropriate medical decision making by the Practitioner; and/or  In rare instances, security protocols could fail, causing a breach of personal health information.  Furthermore, I acknowledge that it is my responsibility to provide information about my medical history, conditions and care that is complete and accurate to the best of my ability. I acknowledge that Practitioner's advice, recommendations, and/or decision may be based on factors not within their control, such as incomplete or inaccurate data provided by me or distortions of diagnostic images or specimens that may result from electronic transmissions. I understand that the practice of medicine is not an exact science and that Practitioner makes no warranties or guarantees regarding treatment outcomes. I acknowledge that a copy of this consent can be made available to me via my patient portal [facility] MyChart), or I can request a printed copy by calling the office of [facility].    I understand that my insurance will be billed for this visit.   I have read or had this consent read to me. I understand the contents of this consent, which adequately explains the benefits and risks of the Services being provided via telemedicine.  I have been provided ample opportunity to ask questions regarding this consent and the Services and have had my questions answered to my satisfaction. I give my informed consent for the services to be provided through the use of telemedicine in my medical care

## 2024-08-29 NOTE — Telephone Encounter (Signed)
 Pt calling to get preop tele set up

## 2024-08-30 ENCOUNTER — Ambulatory Visit

## 2024-08-30 VITALS — Ht 66.0 in | Wt 263.0 lb

## 2024-08-30 DIAGNOSIS — Z0000 Encounter for general adult medical examination without abnormal findings: Secondary | ICD-10-CM | POA: Diagnosis not present

## 2024-08-30 NOTE — Progress Notes (Signed)
 Chief Complaint  Patient presents with   Medicare Wellness     Subjective:   Virginia Matthews is a 68 y.o. female who presents for a Medicare Annual Wellness Visit.  Visit info / Clinical Intake: Medicare Wellness Visit Type:: Subsequent Annual Wellness Visit Persons participating in visit and providing information:: patient Medicare Wellness Visit Mode:: Telephone If telephone:: video declined Since this visit was completed virtually, some vitals may be partially provided or unavailable. Missing vitals are due to the limitations of the virtual format.: Unable to obtain vitals - no equipment If Telephone or Video please confirm:: I connected with patient using audio/video enable telemedicine. I verified patient identity with two identifiers, discussed telehealth limitations, and patient agreed to proceed. Patient Location:: Home Provider Location:: Home Interpreter Needed?: No Pre-visit prep was completed: yes AWV questionnaire completed by patient prior to visit?: yes Date:: 08/28/24 Living arrangements:: (!) lives alone Patient's Overall Health Status Rating: very good Typical amount of pain: none Does pain affect daily life?: no Are you currently prescribed opioids?: no  Dietary Habits and Nutritional Risks How many meals a day?: 3 Eats fruit and vegetables daily?: yes Most meals are obtained by: preparing own meals In the last 2 weeks, have you had any of the following?: none Diabetic:: no  Functional Status Activities of Daily Living (to include ambulation/medication): (Patient-Rptd) Independent Ambulation: Independent with device- listed below Home Assistive Devices/Equipment: Eyeglasses Medication Administration: Independent Home Management (perform basic housework or laundry): (Patient-Rptd) Independent Manage your own finances?: yes Primary transportation is: driving Concerns about vision?: no *vision screening is required for WTM* Concerns about hearing?: (!)  yes (not as good as it used to be=per pt) Uses hearing aids?: no Hear whispered voice?: yes  Fall Screening Falls in the past year?: (Patient-Rptd) 0 Number of falls in past year: 0 Was there an injury with Fall?: 0 Fall Risk Category Calculator: 0 Patient Fall Risk Level: Low Fall Risk  Fall Risk Patient at Risk for Falls Due to: No Fall Risks Fall risk Follow up: Falls evaluation completed; Falls prevention discussed  Home and Transportation Safety: All rugs have non-skid backing?: N/A, no rugs All stairs or steps have railings?: N/A, no stairs Grab bars in the bathtub or shower?: yes Have non-skid surface in bathtub or shower?: (!) no Good home lighting?: yes Regular seat belt use?: yes Hospital stays in the last year:: no  Cognitive Assessment Difficulty concentrating, remembering, or making decisions? : no Will 6CIT or Mini Cog be Completed: no 6CIT or Mini Cog Declined: patient alert, oriented, able to answer questions appropriately and recall recent events  Advance Directives (For Healthcare) Does Patient Have a Medical Advance Directive?: Yes Type of Advance Directive: Healthcare Power of [place]; Living will  Reviewed/Updated  Reviewed/Updated: Reviewed All (Medical, Surgical, Family, Medications, Allergies, Care Teams, Patient Goals)    Allergies (verified) Patient has no active allergies.   Current Medications (verified) Outpatient Encounter Medications as of 08/30/2024  Medication Sig   acetaminophen  (TYLENOL ) 500 MG tablet    albuterol  (VENTOLIN  HFA) 108 (90 Base) MCG/ACT inhaler Inhale 2 puffs into the lungs every 6 hours as needed for wheezing or shortness of breath.   b complex vitamins tablet Take 1 tablet by mouth daily.   clonazePAM  (KLONOPIN ) 0.5 MG tablet Take 1 tablet (0.5 mg total) by mouth at bedtime.   escitalopram  (LEXAPRO ) 10 MG tablet TAKE 1 TABLET BY MOUTH ONCE DAILY   famotidine (PEPCID) 20 MG tablet Take 20 mg by mouth  as needed for  heartburn or indigestion.   fluticasone  (FLONASE ) 50 MCG/ACT nasal spray Place 2 sprays into both nostrils daily.   hydrocortisone valerate cream (WESTCORT) 0.2 %    inFLIXimab  (REMICADE ) 100 MG injection Inject by intravenous route.   lisinopril  (ZESTRIL ) 20 MG tablet TAKE 1 TABLET BY MOUTH DAILY   loratadine (CLARITIN) 10 MG tablet Take 10 mg by mouth once.   Multiple Vitamins-Minerals (CENTRUM SILVER PO) Take 1 tablet by mouth daily. Use as directed   Omega-3 Fatty Acids (FISH OIL PO) Take 1 capsule by mouth daily.   rosuvastatin  (CRESTOR ) 10 MG tablet Take 1 tablet (10 mg total) by mouth daily.   TURMERIC PO Take 1 capsule by mouth 2 (two) times daily.   VITAMIN D , CHOLECALCIFEROL, PO Take 2,000 Units by mouth daily.    No facility-administered encounter medications on file as of 08/30/2024.    History: Past Medical History:  Diagnosis Date   Breast mass, left 06/2016   Carpal tunnel syndrome on both sides    GERD (gastroesophageal reflux disease)    Heart murmur    Hypertension    states under control with med., has been on med. x 3-4 yr.   LVH (left ventricular hypertrophy)    moderate, per echo 01/2016   Moderate aortic regurgitation 07/03/2020   Obesity    PONV (postoperative nausea and vomiting)    Psoriatic arthritis (HCC)    Rosacea    Thoracic ascending aortic aneurysm    Past Surgical History:  Procedure Laterality Date   BREAST EXCISIONAL BIOPSY Left    benign   COLONOSCOPY WITH PROPOFOL   09/12/2015   LAPAROSCOPIC ENDOMETRIOSIS FULGURATION     MICRODISCECTOMY LUMBAR Right 07/16/2011   L4-5   PELVIC LAPAROSCOPY     DIAG LAP   RADIOACTIVE SEED GUIDED EXCISIONAL BREAST BIOPSY Left 07/16/2016   Procedure: LEFT RADIOACTIVE SEED GUIDED EXCISIONAL BREAST BIOPSY;  Surgeon: Donnice Bury, MD;  Location: [facility];  Service: General;  Laterality: Left;   Family History  Problem Relation Age of Onset   Breast cancer Mother    Cancer Father         prostate cancer, lymphoma   Heart disease Father    Hypertension Father    CAD Father    Cancer Brother        prostate cancer   Asthma Brother    CAD Brother    Hypertension Brother    Social History   Occupational History   Occupation: RETIRED  Tobacco Use   Smoking status: Never   Smokeless tobacco: Never  Vaping Use   Vaping status: Never Used  Substance and Sexual Activity   Alcohol use: Yes    Alcohol/week: 0.0 standard drinks of alcohol    Comment: socially   Drug use: No   Sexual activity: Never    Partners: Male    Comment: 1st intercourse- 19, partners- married- 31 yrs    Tobacco Counseling Counseling given: Not Answered  SDOH Screenings   Food Insecurity: No Food Insecurity (08/28/2024)  Housing: Low Risk  (08/28/2024)  Transportation Needs: No Transportation Needs (08/28/2024)  Utilities: Not At Risk (08/30/2024)  Alcohol Screen: Low Risk  (08/28/2024)  Depression (PHQ2-9): Low Risk  (08/30/2024)  Financial Resource Strain: Low Risk  (08/28/2024)  Physical Activity: Insufficiently Active (08/28/2024)  Social Connections: Socially Isolated (08/28/2024)  Stress: Stress Concern Present (08/28/2024)  Tobacco Use: Low Risk  (08/30/2024)   See flowsheets for full screening details  Depression Screen PHQ  2 & 9 Depression Scale- Over the past 2 weeks, how often have you been bothered by any of the following problems? Little interest or pleasure in doing things: 1 (somewhat) Feeling down, depressed, or hopeless (PHQ Adolescent also includes...irritable): 1 (per pt- a little) PHQ-2 Total Score: 2 Trouble falling or staying asleep, or sleeping too much: 1 (falling asleep) Feeling tired or having little energy: 0 Poor appetite or overeating (PHQ Adolescent also includes...weight loss): 0 Feeling bad about yourself - or that you are a failure or have let yourself or your family down: 1 (a little bit-per pt) Trouble concentrating on things, such as reading the newspaper or  watching television (PHQ Adolescent also includes...like school work): 0 Moving or speaking so slowly that other people could have noticed. Or the opposite - being so fidgety or restless that you have been moving around a lot more than usual: 0 Thoughts that you would be better off dead, or of hurting yourself in some way: 0 PHQ-9 Total Score: 4 If you checked off any problems, how difficult have these problems made it for you to do your work, take care of things at home, or get along with other people?: Not difficult at all  Depression Treatment Depression Interventions/Treatment : EYV7-0 Score <4 Follow-up Not Indicated     Goals Addressed             This Visit's Progress    Weight (lb) < 200 lb (90.7 kg)   263 lb (119.3 kg)    Weight loss 80 lb /2025             Objective:    Today's Vitals   08/30/24 0928  Weight: 263 lb (119.3 kg)  Height: 5' 6 (1.676 m)   Body mass index is 42.45 kg/m.  Hearing/Vision screen Vision Screening - Comments:: Wears eyeglasses/Dr. Miller/UTD Immunizations and Health Maintenance Health Maintenance  Topic Date Due   Zoster Vaccines- Shingrix (1 of 2) Never done   COVID-19 Vaccine (7 - 2025-26 season) 05/29/2024   Mammogram  04/10/2025   Medicare Annual Wellness (AWV)  08/30/2025   Colonoscopy  09/11/2025   DTaP/Tdap/Td (2 - Td or Tdap) 04/21/2027   Pneumococcal Vaccine: 50+ Years  Completed   Influenza Vaccine  Completed   Bone Density Scan  Completed   Hepatitis C Screening  Completed   Meningococcal B Vaccine  Aged Out        Assessment/Plan:  This is a routine wellness examination for Virginia Matthews.  Patient Care Team: Mahlon Comer BRAVO, MD as PCP - General (Family Medicine) Raford Riggs, MD as PCP - Cardiology (Cardiology) Wonda Cy BROCKS, RD as Dietitian (Family Medicine) Abran Norleen SAILOR, MD as Consulting Physician (Gastroenterology) Ishmael Slough, MD as Consulting Physician (Rheumatology) Cary Doffing, MD as  Consulting Physician (Dermatology)  I have personally reviewed and noted the following in the patient's chart:   Medical and social history Use of alcohol, tobacco or illicit drugs  Current medications and supplements including opioid prescriptions. Functional ability and status Nutritional status Physical activity Advanced directives List of other physicians Hospitalizations, surgeries, and ER visits in previous 12 months Vitals Screenings to include cognitive, depression, and falls Referrals and appointments  No orders of the defined types were placed in this encounter.  In addition, I have reviewed and discussed with patient certain preventive protocols, quality metrics, and best practice recommendations. A written personalized care plan for preventive services as well as general preventive health recommendations were provided to patient.  Virginia Matthews L Madline Oesterling, CMA   08/30/2024   Return in 1 year (on 08/30/2025).  After Visit Summary: (MyChart) Due to this being a telephonic visit, the after visit summary with patients personalized plan was offered to patient via MyChart   Nurse Notes: Patient is due for a Shingrix vaccine.  Patient is up to date on all other health maintenance with no concerns to address today.

## 2024-08-30 NOTE — Patient Instructions (Addendum)
 Virginia Matthews,  Thank you for taking the time for your Medicare Wellness Visit. I appreciate your continued commitment to your health goals. Please review the care plan we discussed, and feel free to reach out if I can assist you further.  Please note that Annual Wellness Visits do not include a physical exam. Some assessments may be limited, especially if the visit was conducted virtually. If needed, we may recommend an in-person follow-up with your provider.  Ongoing Care Seeing your primary care provider every 3 to 6 months helps us  monitor your health and provide consistent, personalized care. Next office visit on 11/03/2024.  You are due for a shingles vaccine and can get that done at your local pharmacy.  Keep up the good work.  Referrals If a referral was made during today's visit and you haven't received any updates within two weeks, please contact the referred provider directly to check on the status.  Recommended Screenings:  Health Maintenance  Topic Date Due   Zoster (Shingles) Vaccine (1 of 2) Never done   Medicare Annual Wellness Visit  05/08/2023   Flu Shot  04/28/2024   COVID-19 Vaccine (7 - 2025-26 season) 05/29/2024   Breast Cancer Screening  04/10/2025   Colon Cancer Screening  09/11/2025   DTaP/Tdap/Td vaccine (2 - Td or Tdap) 04/21/2027   Pneumococcal Vaccine for age over 30  Completed   Osteoporosis screening with Bone Density Scan  Completed   Hepatitis C Screening  Completed   Meningitis B Vaccine  Aged Out       08/28/2024    2:59 PM  Advanced Directives  Does Patient Have a Medical Advance Directive? Yes  Type of Estate Agent of [place];Living will    Vision: Annual vision screenings are recommended for early detection of glaucoma, cataracts, and diabetic retinopathy. These exams can also reveal signs of chronic conditions such as diabetes and high blood pressure.  Dental: Annual dental screenings help detect early signs of oral  cancer, gum disease, and other conditions linked to overall health, including heart disease and diabetes.  Please see the attached documents for additional preventive care recommendations.

## 2024-09-01 DIAGNOSIS — L821 Other seborrheic keratosis: Secondary | ICD-10-CM | POA: Diagnosis not present

## 2024-09-01 DIAGNOSIS — L918 Other hypertrophic disorders of the skin: Secondary | ICD-10-CM | POA: Diagnosis not present

## 2024-09-01 DIAGNOSIS — D2362 Other benign neoplasm of skin of left upper limb, including shoulder: Secondary | ICD-10-CM | POA: Diagnosis not present

## 2024-09-01 DIAGNOSIS — D2272 Melanocytic nevi of left lower limb, including hip: Secondary | ICD-10-CM | POA: Diagnosis not present

## 2024-09-01 DIAGNOSIS — D1801 Hemangioma of skin and subcutaneous tissue: Secondary | ICD-10-CM | POA: Diagnosis not present

## 2024-09-01 DIAGNOSIS — L814 Other melanin hyperpigmentation: Secondary | ICD-10-CM | POA: Diagnosis not present

## 2024-09-01 DIAGNOSIS — Z85828 Personal history of other malignant neoplasm of skin: Secondary | ICD-10-CM | POA: Diagnosis not present

## 2024-09-01 DIAGNOSIS — D2371 Other benign neoplasm of skin of right lower limb, including hip: Secondary | ICD-10-CM | POA: Diagnosis not present

## 2024-09-01 DIAGNOSIS — L570 Actinic keratosis: Secondary | ICD-10-CM | POA: Diagnosis not present

## 2024-09-05 ENCOUNTER — Ambulatory Visit: Attending: Cardiology

## 2024-09-05 DIAGNOSIS — Z01810 Encounter for preprocedural cardiovascular examination: Secondary | ICD-10-CM | POA: Diagnosis present

## 2024-09-05 NOTE — Progress Notes (Signed)
 Virtual Visit via Telephone Note   Because of Virginia Matthews co-morbid illnesses, she is at least at moderate risk for complications without adequate follow up.  This format is felt to be most appropriate for this patient at this time.  Due to technical limitations with video connection (technology), today's appointment will be conducted as an audio only telehealth visit, and Virginia Matthews verbally agreed to proceed in this manner.   All issues noted in this document were discussed and addressed.  No physical exam could be performed with this format.  Evaluation Performed:  Preoperative cardiovascular risk assessment _____________   Date:  09/05/2024   Patient ID:  Virginia Matthews, Virginia Matthews 1956-02-23, MRN [id number] Patient Location:  Home Provider location:   Office  Primary Care Provider:  Mahlon Comer BRAVO, MD Primary Cardiologist:  Annabella Scarce, MD  Chief Complaint / Patient Profile   68 y.o. y/o female with a h/o coronary artery calcification, aortic atherosclerosis, hyperlipidemia, ascending aortic dilatation, family history of ischemic heart disease, hypertension, morbid obesity,  who is pending right total knee arthroplasty with Dr. Hiram on 10/03/24 and presents today for telephonic preoperative cardiovascular risk assessment.  History of Present Illness    Virginia Matthews is a 68 y.o. female who presents via audio/video conferencing for a telehealth visit today.  Pt was last seen in cardiology clinic on 01/26/24 by Dr Scarce.  At that time Virginia Matthews was doing well.  The patient is now pending procedure as outlined above. Since her last visit, she denies chest pain, shortness of breath, lower extremity edema, fatigue, palpitations, presyncope, syncope, orthopnea, and PND. Reports she is losing weight and exercising regularly and is able to achieve > 4 METS Activity without concerning cardiac symptoms.   Past Medical History    Past Medical History:  Diagnosis Date    Breast mass, left 06/2016   Carpal tunnel syndrome on both sides    GERD (gastroesophageal reflux disease)    Heart murmur    Hypertension    states under control with med., has been on med. x 3-4 yr.   LVH (left ventricular hypertrophy)    moderate, per echo 01/2016   Moderate aortic regurgitation 07/03/2020   Obesity    PONV (postoperative nausea and vomiting)    Psoriatic arthritis (HCC)    Rosacea    Thoracic ascending aortic aneurysm    Past Surgical History:  Procedure Laterality Date   BREAST EXCISIONAL BIOPSY Left    benign   COLONOSCOPY WITH PROPOFOL   09/12/2015   LAPAROSCOPIC ENDOMETRIOSIS FULGURATION     MICRODISCECTOMY LUMBAR Right 07/16/2011   L4-5   PELVIC LAPAROSCOPY     DIAG LAP   RADIOACTIVE SEED GUIDED EXCISIONAL BREAST BIOPSY Left 07/16/2016   Procedure: LEFT RADIOACTIVE SEED GUIDED EXCISIONAL BREAST BIOPSY;  Surgeon: Donnice Bury, MD;  Location: [facility];  Service: General;  Laterality: Left;    Allergies  No Active Allergies  Home Medications    Prior to Admission medications   Medication Sig Start Date End Date Taking? Authorizing Provider  acetaminophen  (TYLENOL ) 500 MG tablet     [provider]  albuterol  (VENTOLIN  HFA) 108 (90 Base) MCG/ACT inhaler Inhale 2 puffs into the lungs every 6 hours as needed for wheezing or shortness of breath. 01/30/22   Tabori, Katherine E, MD  b complex vitamins tablet Take 1 tablet by mouth daily.    [provider]  clonazePAM  (KLONOPIN ) 0.5 MG tablet Take 1 tablet (  0.5 mg total) by mouth at bedtime. 05/03/24   Tabori, Katherine E, MD  escitalopram  (LEXAPRO ) 10 MG tablet TAKE 1 TABLET BY MOUTH ONCE DAILY 04/28/24 04/28/25  Tabori, Katherine E, MD  famotidine (PEPCID) 20 MG tablet Take 20 mg by mouth as needed for heartburn or indigestion.    [provider]  fluticasone  (FLONASE ) 50 MCG/ACT nasal spray Place 2 sprays into both nostrils daily. 09/18/15   Tabori, Katherine E,  MD  hydrocortisone valerate cream (WESTCORT) 0.2 %  09/14/17   [provider]  inFLIXimab  (REMICADE ) 100 MG injection Inject by intravenous route.    [provider]  lisinopril  (ZESTRIL ) 20 MG tablet TAKE 1 TABLET BY MOUTH DAILY 02/07/24   Raford Riggs, MD  loratadine (CLARITIN) 10 MG tablet Take 10 mg by mouth once. 05/29/21   [provider]  Multiple Vitamins-Minerals (CENTRUM SILVER PO) Take 1 tablet by mouth daily. Use as directed    [provider]  Omega-3 Fatty Acids (FISH OIL PO) Take 1 capsule by mouth daily.    [provider]  rosuvastatin  (CRESTOR ) 10 MG tablet Take 1 tablet (10 mg total) by mouth daily. 03/30/24 08/30/24  Raford Riggs, MD  TURMERIC PO Take 1 capsule by mouth 2 (two) times daily.    [provider]  VITAMIN D , CHOLECALCIFEROL, PO Take 2,000 Units by mouth daily.     [provider]    Physical Exam    Vital Signs:  Virginia Matthews does not have vital signs available for review today.  Given telephonic nature of communication, physical exam is limited. AAOx3. NAD. Normal affect.  Speech and respirations are unlabored.  Accessory Clinical Findings    None  Assessment & Plan    1.  Preoperative Cardiovascular Risk Assessment: According to the Revised Cardiac Risk Index (RCRI), her Perioperative Risk of Major Cardiac Event is (%): 0.9. Her Functional Capacity in METs is: 7.53 according to the Duke Activity Status Index (DASI). The patient is doing well from a cardiac perspective. Therefore, based on ACC/AHA guidelines, the patient would be at acceptable risk for the planned procedure without further cardiovascular testing.    The patient was advised that if she develops new symptoms prior to surgery to contact our office to arrange for a follow-up visit, and she verbalized understanding.  No request to hold cardiac medications  A copy of this note will be routed to requesting  surgeon.  Time:   Today, I have spent 10 minutes with the patient with telehealth technology discussing medical history, symptoms, and management plan.     Virginia EMERSON Bane, NP-C  09/05/2024, 8:45 AM [address] [place], [place] [address] Office [phone] Fax [phone]

## 2024-09-29 ENCOUNTER — Other Ambulatory Visit (HOSPITAL_COMMUNITY): Payer: Self-pay

## 2024-09-29 MED ORDER — ONDANSETRON HCL 4 MG PO TABS
4.0000 mg | ORAL_TABLET | Freq: Four times a day (QID) | ORAL | 0 refills | Status: AC | PRN
  Filled 2024-09-29: qty 20, 5d supply, fill #0

## 2024-09-29 MED ORDER — METHOCARBAMOL 500 MG PO TABS
500.0000 mg | ORAL_TABLET | Freq: Four times a day (QID) | ORAL | 0 refills | Status: DC | PRN
  Filled 2024-09-29: qty 40, 10d supply, fill #0

## 2024-09-29 MED ORDER — ASPIRIN 81 MG PO TBEC
DELAYED_RELEASE_TABLET | ORAL | 0 refills | Status: AC
  Filled 2024-09-29: qty 63, 42d supply, fill #0

## 2024-09-29 MED ORDER — OXYCODONE HCL 5 MG PO TABS
5.0000 mg | ORAL_TABLET | ORAL | 0 refills | Status: DC | PRN
  Filled 2024-09-29: qty 42, 7d supply, fill #0

## 2024-09-29 MED ORDER — GABAPENTIN 300 MG PO CAPS
ORAL_CAPSULE | ORAL | 0 refills | Status: AC
  Filled 2024-09-29: qty 84, 42d supply, fill #0

## 2024-10-03 HISTORY — PX: TOTAL KNEE ARTHROPLASTY: SHX125

## 2024-10-05 ENCOUNTER — Ambulatory Visit: Attending: Orthopedic Surgery | Admitting: Physical Therapy

## 2024-10-05 ENCOUNTER — Other Ambulatory Visit: Payer: Self-pay

## 2024-10-05 ENCOUNTER — Encounter: Payer: Self-pay | Admitting: Physical Therapy

## 2024-10-05 DIAGNOSIS — M6281 Muscle weakness (generalized): Secondary | ICD-10-CM | POA: Diagnosis present

## 2024-10-05 DIAGNOSIS — G8929 Other chronic pain: Secondary | ICD-10-CM | POA: Diagnosis present

## 2024-10-05 DIAGNOSIS — R600 Localized edema: Secondary | ICD-10-CM | POA: Insufficient documentation

## 2024-10-05 DIAGNOSIS — R2689 Other abnormalities of gait and mobility: Secondary | ICD-10-CM | POA: Insufficient documentation

## 2024-10-05 DIAGNOSIS — M25561 Pain in right knee: Secondary | ICD-10-CM | POA: Diagnosis present

## 2024-10-05 NOTE — Therapy (Signed)
 " OUTPATIENT PHYSICAL THERAPY LOWER EXTREMITY EVALUATION   Patient Name: Virginia Matthews MRN: [id number] DOB:04-07-1956, 69 y.o., female Today's Date: 10/05/2024  END OF SESSION:  PT End of Session - 10/05/24 1027     Visit Number 1    Number of Visits 17    Date for Recertification  11/30/24    Authorization Type MCR    Progress Note Due on Visit 10    PT Start Time 1018    PT Stop Time 1105    PT Time Calculation (min) 47 min    Activity Tolerance Patient tolerated treatment well    Behavior During Therapy [facility] for tasks assessed/performed          Past Medical History:  Diagnosis Date   Breast mass, left 06/2016   Carpal tunnel syndrome on both sides    GERD (gastroesophageal reflux disease)    Heart murmur    Hypertension    states under control with med., has been on med. x 3-4 yr.   LVH (left ventricular hypertrophy)    moderate, per echo 01/2016   Moderate aortic regurgitation 07/03/2020   Obesity    PONV (postoperative nausea and vomiting)    Psoriatic arthritis (HCC)    Rosacea    Thoracic ascending aortic aneurysm    Past Surgical History:  Procedure Laterality Date   BREAST EXCISIONAL BIOPSY Left    benign   COLONOSCOPY WITH PROPOFOL   09/12/2015   LAPAROSCOPIC ENDOMETRIOSIS FULGURATION     MICRODISCECTOMY LUMBAR Right 07/16/2011   L4-5   PELVIC LAPAROSCOPY     DIAG LAP   RADIOACTIVE SEED GUIDED EXCISIONAL BREAST BIOPSY Left 07/16/2016   Procedure: LEFT RADIOACTIVE SEED GUIDED EXCISIONAL BREAST BIOPSY;  Surgeon: Donnice Bury, MD;  Location: [facility];  Service: General;  Laterality: Left;   TOTAL KNEE ARTHROPLASTY Right 10/03/2024   Patient Active Problem List   Diagnosis Date Noted   Stress incontinence 05/03/2024   Bilateral hand swelling 03/13/2024   Bursitis of hip, right 03/13/2024   Long term current use of therapeutic drug 03/13/2024   Primary osteoarthritis of both knees 03/13/2024   Unilateral primary osteoarthritis,  right knee 09/13/2023   Hamstring tendonitis of right thigh 10/22/2022   Primary localized osteoarthrosis of multiple sites 05/25/2022   Moderate aortic regurgitation 07/03/2020   Right knee pain 07/01/2017   Unilateral primary osteoarthritis, left knee 04/13/2017   Psoriatic arthritis (HCC) 04/17/2016   LVH (left ventricular hypertrophy) 02/11/2016   Ascending aortic aneurysm 02/11/2016   Carotid bruit 12/31/2015   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 09/10/2015   Left wrist pain 07/05/2015   Bilateral hip pain 07/05/2015   Leg size inequality 07/05/2015   Hyperlipidemia 06/25/2014   Right facial numbness 12/21/2013   Anxiety and depression 05/13/2012   Physical exam, routine 05/13/2012   GERD (gastroesophageal reflux disease) 05/13/2012   Seasonal allergies    PCO (polycystic ovaries)    Ruptured lumbar disc    Endometriosis    HYPERTENSION, BENIGN ESSENTIAL 05/01/2010   NONSPEC ELEVATION OF LEVELS OF TRANSAMINASE/LDH 03/14/2009   Severe obesity (BMI >= 40) (HCC) 02/11/2009   Allergic rhinitis 02/11/2009   Abdominal pain 02/11/2009    PCP: Mahlon Comer BRAVO, MD   REFERRING PROVIDER: Melodi Lerner, MD   REFERRING DIAG: Presence of right artificial knee joint [Z96.651]   THERAPY DIAG:  Chronic pain of right knee  Muscle weakness (generalized)  Other abnormalities of gait and mobility  Localized edema  Rationale for Evaluation and Treatment: Rehabilitation  ONSET DATE: 10/03/2024 DOS  SUBJECTIVE:   SUBJECTIVE STATEMENT: Pt arrives to Pt s/p R TKA on 10/03/2024. She reports overall she is doing pretty good with walking around the house and keeping elevation with ice. The nerve block wore off the other day and note an increase in pain but overall been doing pretty good with her / standing and activity.   PERTINENT HISTORY: See PMHx PAIN:  Are you having pain? Yes: NPRS scale: 1-/2/10, at worst 9/10 Pain location: around the knee Pain  description: aching / stabbing.  Aggravating factors: sit to standing, end of the day.  Relieving factors: pain meds, ice, elevation  PRECAUTIONS: None  RED FLAGS: None   WEIGHT BEARING RESTRICTIONS: No  FALLS:  Has patient fallen in last 6 months? 1 as a result of tripping.   LIVING ENVIRONMENT: Lives with: lives alone Lives in: House/apartment Stairs: No Has following equipment at home: Vannie - 2 wheeled, Grab bars, and lift chair.   OCCUPATION: retired   PLOF: Independent with basic ADLs  PATIENT GOALS: Walking on multiple surfaces, and hiking, stairs Gardening.    OBJECTIVE:  Note: Objective measures were completed at Evaluation unless otherwise noted.  DIAGNOSTIC FINDINGS:  See chart  PATIENT SURVEYS:  LEFS  Extreme difficulty/unable (0), Quite a bit of difficulty (1), Moderate difficulty (2), Little difficulty (3), No difficulty (4) Survey date:  10/05/2024  Any of your usual work, housework or school activities 1  2. Usual hobbies, recreational or sporting activities 0  3. Getting into/out of the bath 1  4. Walking between rooms 3  5. Putting on socks/shoes 1  6. Squatting  1  7. Lifting an object, like a bag of groceries from the floor 2  8. Performing light activities around your home 2  9. Performing heavy activities around your home 0  10. Getting into/out of a car 1  11. Walking 2 blocks 1  12. Walking 1 mile 0  13. Going up/down 10 stairs (1 flight) 0  14. Standing for 1 hour 0  15.  sitting for 1 hour 3  16. Running on even ground 0  17. Running on uneven ground 0  18. Making sharp turns while running fast 0  19. Hopping  0  20. Rolling over in bed 2  Score total:  18/80     COGNITION: Overall cognitive status: Within functional limits for tasks assessed     SENSATION: Not tested   POSTURE: rounded shoulders and forward head  PALPATION: Gross tenderness around the knee.   LOWER EXTREMITY ROM:  Active ROM Right eval Left eval   Hip flexion    Hip extension    Hip abduction    Hip adduction    Hip internal rotation    Hip external rotation    Knee flexion 90   Knee extension 15   Ankle dorsiflexion    Ankle plantarflexion    Ankle inversion    Ankle eversion     (Blank rows = not tested)  LOWER EXTREMITY MMT:  MMT Right eval Left eval  Hip flexion    Hip extension    Hip abduction    Hip adduction    Hip internal rotation    Hip external rotation    Knee flexion    Knee extension    Ankle dorsiflexion    Ankle plantarflexion    Ankle inversion    Ankle eversion     (Blank rows =  not tested)  LOWER EXTREMITY SPECIAL TESTS:  N/A  FUNCTIONAL TESTS:  Timed up and go (TUG): Plan to capture next session.   GAIT: Distance walked: waiting area to tx room  Assistive device utilized: Walker - 2 wheeled Level of assistance: Modified independence Comments: decreased stride bil                                                                                                                                 TREATMENT:  [facility] Adult PT Treatment:                                                DATE: 10/06/2023 Quad set 5 x 5 sec hold  SAQ 1 x 10  Heel slides 1 x 10 with strap holding 5 sec at end range Glute set 1 x 10 holding 5 sec Game ready x 10 min, med pressure, 34 degrees Provided initial HEP  PATIENT EDUCATION:  Education details: evaluation findings, POC, goals, HEP with proper form/ rationale.  Person educated: Patient Education method: Explanation and Handouts Education comprehension: verbalized understanding  HOME EXERCISE PROGRAM: Access Code: [email or web address] URL: [email or web address] Date: 10/05/2024 Prepared by: Joneen Fresh  Exercises - Supine Quad Set  - 3 x daily - 7 x weekly - 2 sets - 10 reps - holding 5 sec hold - Supine Heel Slide with Strap  - 2-3 x daily - 7 x weekly - 2 sets - 10 reps - 5 sec hold - Seated Heel Slide  - 2-3 x daily - 7 x weekly - 2 sets -  10 reps - 5 hold - Supine Gluteal Sets  - 1 x daily - 7 x weekly - 2 sets - 10 reps - 5 hold - Seated Hamstring Stretch  - 1 x daily - 7 x weekly - 2 sets - 2 reps - 30 hold - Supine Hamstring Stretch with Strap  - 1 x daily - 7 x weekly - 2 sets - 2 reps - 30 hold  ASSESSMENT:  CLINICAL IMPRESSION: Patient is a 69 y.o. F who was seen today for physical therapy evaluation and treatment for dx of R TKA on 10/03/2024. Since surgery she notes pain has increased some as a result of the her nerve block wearing off. She current has a total arc of ROM of 15 - 90 degrees actively.  She ambulates with RW utilizing short stride steps bil. She was able to perform all execises today and ice was applied end of session to help with pain and swelling. She would benefit from physical therapy to decrease R knee pain/ swelling, promote ROM and strength, maximize gait efficiency and overall function by addressing the deficits listed.   OBJECTIVE IMPAIRMENTS: Abnormal gait, decreased activity tolerance, decreased balance, difficulty walking, decreased ROM, decreased strength, increased  edema, postural dysfunction, obesity, and pain.   ACTIVITY LIMITATIONS: lifting, bending, standing, squatting, transfers, and locomotion level  PARTICIPATION LIMITATIONS: community activity and yard work  PERSONAL FACTORS: 1-2 comorbidities: Psoriatic arthritis, and obesity are also affecting patient's functional outcome.   REHAB POTENTIAL: Good  CLINICAL DECISION MAKING: Evolving/moderate complexity  EVALUATION COMPLEXITY: Moderate   GOALS: Goals reviewed with patient? Yes  SHORT TERM GOALS: Target date: 11/02/2024  Pt to be ind with initial HEP for therapeutic progression  Baseline: Goal status: INITIAL  2.  Pt to increase R knee total arc ROM to >/= 10 - 110 for functional progression Baseline:  Goal status: INITIAL  3.  Pt to be able to ambulate with a SPC or LRAD demonstrating an efficeint gait pattern with  reporting of pain </= 5/10 max Baseline:  Goal status: INITIAL  4.  Improve LEFS score to >/= 30/80 to demonstrate improving function and mobility.  Baseline:  Goal status: INITIAL    LONG TERM GOALS: Target date: 11/30/2024   Pt to increase R knee total arc ROM to >/= 5 - 120 with </= 2/10 max pain for functional ROM required for ADLs Baseline:  Goal status: INITIAL  2.  Pt to increase RLE gross strength to >/= 4/5 to promote both knee and hips stability with walking/ standing Baseline:  Goal status: INITIAL  3.  Improve LEFS score to > /= 65 /80 to demo overal functional improvement Baseline:  Goal status: INITIAL  4.  Pt to be able to walk with no AD for >/= 45 min for functional endurance needed for ADLs including and community ambulation Baseline:  Goal status: INITIAL  5.  Pt to be able to return to gardening, and hiking per her personal goals with no limitations or pain Baseline:  Goal status: INITIAL  6.  Pt to be IND with all HEP and will be able to maintain and progress their current LOF IND.  Baseline:  Goal status: INITIAL   PLAN:  PT FREQUENCY: 2x/week  PT DURATION: 8 weeks  PLANNED INTERVENTIONS: 97110-Therapeutic exercises, 97530- Therapeutic activity, [id number]- Neuromuscular re-education, 97535- Self Care, [id number]- Manual therapy, [phone]- Gait training, [phone]- Aquatic Therapy, [phone]- Electrical stimulation (unattended), [id number]- Vasopneumatic device, Patient/Family education, Balance training, Stair training, Taping, Joint mobilization, Scar mobilization, Cryotherapy, and Moist heat  PLAN FOR NEXT SESSION: review/ update HEP. Knee ROM/ mobilization, quad activation and strengthening. Sit to stand biomechanics, TUG assessment.    Christianjames Soule PT, DPT, LAT, ATC  10/05/2024  11:33 AM      "

## 2024-10-10 ENCOUNTER — Encounter: Payer: Self-pay | Admitting: Physical Therapy

## 2024-10-10 ENCOUNTER — Ambulatory Visit: Admitting: Physical Therapy

## 2024-10-10 DIAGNOSIS — G8929 Other chronic pain: Secondary | ICD-10-CM

## 2024-10-10 DIAGNOSIS — M25561 Pain in right knee: Secondary | ICD-10-CM | POA: Diagnosis not present

## 2024-10-10 DIAGNOSIS — R600 Localized edema: Secondary | ICD-10-CM

## 2024-10-10 DIAGNOSIS — M6281 Muscle weakness (generalized): Secondary | ICD-10-CM

## 2024-10-10 NOTE — Therapy (Signed)
 " OUTPATIENT PHYSICAL THERAPY TREATMENT   Patient Name: Virginia Matthews MRN: [id number] DOB:06-15-1956, 69 y.o., female Today's Date: 10/10/2024  END OF SESSION:  PT End of Session - 10/10/24 1023     Visit Number 2    Number of Visits 17    Date for Recertification  11/30/24    Authorization Type MCR    Progress Note Due on Visit 10    PT Start Time 1018    PT Stop Time 1113    PT Time Calculation (min) 55 min           Past Medical History:  Diagnosis Date   Breast mass, left 06/2016   Carpal tunnel syndrome on both sides    GERD (gastroesophageal reflux disease)    Heart murmur    Hypertension    states under control with med., has been on med. x 3-4 yr.   LVH (left ventricular hypertrophy)    moderate, per echo 01/2016   Moderate aortic regurgitation 07/03/2020   Obesity    PONV (postoperative nausea and vomiting)    Psoriatic arthritis (HCC)    Rosacea    Thoracic ascending aortic aneurysm    Past Surgical History:  Procedure Laterality Date   BREAST EXCISIONAL BIOPSY Left    benign   COLONOSCOPY WITH PROPOFOL   09/12/2015   LAPAROSCOPIC ENDOMETRIOSIS FULGURATION     MICRODISCECTOMY LUMBAR Right 07/16/2011   L4-5   PELVIC LAPAROSCOPY     DIAG LAP   RADIOACTIVE SEED GUIDED EXCISIONAL BREAST BIOPSY Left 07/16/2016   Procedure: LEFT RADIOACTIVE SEED GUIDED EXCISIONAL BREAST BIOPSY;  Surgeon: Donnice Bury, MD;  Location: [facility];  Service: General;  Laterality: Left;   TOTAL KNEE ARTHROPLASTY Right 10/03/2024   Patient Active Problem List   Diagnosis Date Noted   Stress incontinence 05/03/2024   Bilateral hand swelling 03/13/2024   Bursitis of hip, right 03/13/2024   Long term current use of therapeutic drug 03/13/2024   Primary osteoarthritis of both knees 03/13/2024   Unilateral primary osteoarthritis, right knee 09/13/2023   Hamstring tendonitis of right thigh 10/22/2022   Primary localized osteoarthrosis of multiple sites  05/25/2022   Moderate aortic regurgitation 07/03/2020   Right knee pain 07/01/2017   Unilateral primary osteoarthritis, left knee 04/13/2017   Psoriatic arthritis (HCC) 04/17/2016   LVH (left ventricular hypertrophy) 02/11/2016   Ascending aortic aneurysm 02/11/2016   Carotid bruit 12/31/2015   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 09/10/2015   Left wrist pain 07/05/2015   Bilateral hip pain 07/05/2015   Leg size inequality 07/05/2015   Hyperlipidemia 06/25/2014   Right facial numbness 12/21/2013   Anxiety and depression 05/13/2012   Physical exam, routine 05/13/2012   GERD (gastroesophageal reflux disease) 05/13/2012   Seasonal allergies    PCO (polycystic ovaries)    Ruptured lumbar disc    Endometriosis    HYPERTENSION, BENIGN ESSENTIAL 05/01/2010   NONSPEC ELEVATION OF LEVELS OF TRANSAMINASE/LDH 03/14/2009   Severe obesity (BMI >= 40) (HCC) 02/11/2009   Allergic rhinitis 02/11/2009   Abdominal pain 02/11/2009    PCP: Mahlon Comer BRAVO, MD   REFERRING PROVIDER: Melodi Lerner, MD   REFERRING DIAG: Presence of right artificial knee joint [Z96.651]   THERAPY DIAG:  Chronic pain of right knee  Muscle weakness (generalized)  Localized edema  Rationale for Evaluation and Treatment: Rehabilitation  ONSET DATE: 10/03/2024 DOS  SUBJECTIVE:   SUBJECTIVE STATEMENT: 10/10/2024 I am having more pain  today, I have  a little trouble doing the heel slide exercise with a strap.  EVAL: Pt arrives to Pt s/p R TKA on 10/03/2024. She reports overall she is doing pretty good with walking around the house and keeping elevation with ice. The nerve block wore off the other day and note an increase in pain but overall been doing pretty good with her / standing and activity.   PERTINENT HISTORY: See PMHx PAIN:  Are you having pain? Yes: NPRS scale: 3-4/10 - worst 10/10 Pain location: around the knee Pain description: aching / stabbing.  Aggravating  factors: sit to standing, end of the day.  Relieving factors: pain meds, ice, elevation  PRECAUTIONS: None  RED FLAGS: None   WEIGHT BEARING RESTRICTIONS: No  FALLS:  Has patient fallen in last 6 months? 1 as a result of tripping.   LIVING ENVIRONMENT: Lives with: lives alone Lives in: House/apartment Stairs: No Has following equipment at home: Vannie - 2 wheeled, Grab bars, and lift chair.   OCCUPATION: retired   PLOF: Independent with basic ADLs  PATIENT GOALS: Walking on multiple surfaces, and hiking, stairs Gardening.    OBJECTIVE:  Note: Objective measures were completed at Evaluation unless otherwise noted.  DIAGNOSTIC FINDINGS:  See chart  PATIENT SURVEYS:  LEFS  Extreme difficulty/unable (0), Quite a bit of difficulty (1), Moderate difficulty (2), Little difficulty (3), No difficulty (4) Survey date:  10/05/2024  Any of your usual work, housework or school activities 1  2. Usual hobbies, recreational or sporting activities 0  3. Getting into/out of the bath 1  4. Walking between rooms 3  5. Putting on socks/shoes 1  6. Squatting  1  7. Lifting an object, like a bag of groceries from the floor 2  8. Performing light activities around your home 2  9. Performing heavy activities around your home 0  10. Getting into/out of a car 1  11. Walking 2 blocks 1  12. Walking 1 mile 0  13. Going up/down 10 stairs (1 flight) 0  14. Standing for 1 hour 0  15.  sitting for 1 hour 3  16. Running on even ground 0  17. Running on uneven ground 0  18. Making sharp turns while running fast 0  19. Hopping  0  20. Rolling over in bed 2  Score total:  18/80     COGNITION: Overall cognitive status: Within functional limits for tasks assessed     SENSATION: Not tested   POSTURE: rounded shoulders and forward head  PALPATION: Gross tenderness around the knee.   LOWER EXTREMITY ROM:  Active ROM Right eval Left eval Right 10/10/2024  Hip flexion     Hip extension      Hip abduction     Hip adduction     Hip internal rotation     Hip external rotation     Knee flexion 90  88  Knee extension 15    Ankle dorsiflexion     Ankle plantarflexion     Ankle inversion     Ankle eversion      (Blank rows = not tested)  LOWER EXTREMITY MMT:  MMT Right eval Left eval  Hip flexion    Hip extension    Hip abduction    Hip adduction    Hip internal rotation    Hip external rotation    Knee flexion    Knee extension    Ankle dorsiflexion    Ankle plantarflexion    Ankle inversion  Ankle eversion     (Blank rows = not tested)  LOWER EXTREMITY SPECIAL TESTS:  N/A  FUNCTIONAL TESTS:  1/13/2026Timed up and go (TUG): 30 sec with RW  GAIT: Distance walked: waiting area to tx room  Assistive device utilized: Walker - 2 wheeled Level of assistance: Modified independence Comments: decreased stride bil                                                                                                                                 TREATMENT:  [facility] Adult PT Treatment:                                                DATE: 10/10/2024 Tibiofemoral AP mobs grade III with active flexion between  Seated tbiofemoral distraction with flexion Seated hamstring curl 1 x 10 with RTB LAQ  1 x 8  Recumbent bike x 10 1/2 revolutions and 8 full reverse revolutions Gait training with RW utilizing heel strike/ toe off and equal stride Game ready x 15 min, med pressure, 34 degrees.  Reviewed and updated HEP today.   [facility] Adult PT Treatment:                                                DATE: 10/06/2023 Quad set 5 x 5 sec hold  SAQ 1 x 10  Heel slides 1 x 10 with strap holding 5 sec at end range Glute set 1 x 10 holding 5 sec Game ready x 10 min, med pressure, 34 degrees Provided initial HEP  PATIENT EDUCATION:  Education details: evaluation findings, POC, goals, HEP with proper form/ rationale.  Person educated: Patient Education method: Explanation and  Handouts Education comprehension: verbalized understanding  HOME EXERCISE PROGRAM: Access Code: [email or web address] URL: [email or web address] Date: 10/10/2024 Prepared by: Joneen Fresh  Program Notes walking utilizing heel strike/ toe off on both sides and making step length equal.   Exercises - Supine Quad Set  - 3 x daily - 7 x weekly - 2 sets - 10 reps - holding 5 sec hold - Supine Heel Slide with Strap  - 2-3 x daily - 7 x weekly - 2 sets - 10 reps - 5 sec hold - Seated Heel Slide  - 2-3 x daily - 7 x weekly - 2 sets - 10 reps - 5 hold - Supine Gluteal Sets  - 1 x daily - 7 x weekly - 2 sets - 10 reps - 5 hold - Seated Hamstring Stretch  - 1 x daily - 7 x weekly - 2 sets - 2 reps - 30 hold - Supine Hamstring Stretch with Strap  - 1 x daily - 7  x weekly - 2 sets - 2 reps - 30 hold - Seated Long Arc Quad  - 1 x daily - 7 x weekly - 2-3 sets - 10-12 reps  ASSESSMENT:  CLINICAL IMPRESSION: 1/13/2026Mrs Mcavoy arrives to session noting consistency with her HEP and increase pain compared to prior session. Continued working on knee ROM and activtion of the quads and hamstrings. She was able to do full revolutions in reverse after a few 1/2 revolutions. Assessed TUG today which she measures 30 seconds,and discussed goals of 15 sec or less with LRAD. Continued use of Game ready to reduce pain and swelling end of session.  EVAL: Patient is a 69 y.o. F who was seen today for physical therapy evaluation and treatment for dx of R TKA on 10/03/2024. Since surgery she notes pain has increased some as a result of the her nerve block wearing off. She current has a total arc of ROM of 15 - 90 degrees actively.  She ambulates with RW utilizing short stride steps bil. She was able to perform all execises today and ice was applied end of session to help with pain and swelling. She would benefit from physical therapy to decrease R knee pain/ swelling, promote ROM and strength, maximize gait  efficiency and overall function by addressing the deficits listed.   OBJECTIVE IMPAIRMENTS: Abnormal gait, decreased activity tolerance, decreased balance, difficulty walking, decreased ROM, decreased strength, increased edema, postural dysfunction, obesity, and pain.   ACTIVITY LIMITATIONS: lifting, bending, standing, squatting, transfers, and locomotion level  PARTICIPATION LIMITATIONS: community activity and yard work  PERSONAL FACTORS: 1-2 comorbidities: Psoriatic arthritis, and obesity are also affecting patient's functional outcome.   REHAB POTENTIAL: Good  CLINICAL DECISION MAKING: Evolving/moderate complexity  EVALUATION COMPLEXITY: Moderate   GOALS: Goals reviewed with patient? Yes  SHORT TERM GOALS: Target date: 11/02/2024  Pt to be ind with initial HEP for therapeutic progression  Baseline: Goal status: INITIAL  2.  Pt to increase R knee total arc ROM to >/= 10 - 110 for functional progression Baseline:  Goal status: INITIAL  3.  Pt to be able to ambulate with a SPC or LRAD demonstrating an efficeint gait pattern with reporting of pain </= 5/10 max Baseline:  Goal status: INITIAL  4.  Improve LEFS score to >/= 30/80 to demonstrate improving function and mobility.  Baseline:  Goal status: INITIAL    LONG TERM GOALS: Target date: 11/30/2024   Pt to increase R knee total arc ROM to >/= 5 - 120 with </= 2/10 max pain for functional ROM required for ADLs Baseline:  Goal status: INITIAL  2.  Pt to increase RLE gross strength to >/= 4/5 to promote both knee and hips stability with walking/ standing Baseline:  Goal status: INITIAL  3.  Improve LEFS score to > /= 65 /80 to demo overal functional improvement Baseline:  Goal status: INITIAL  4.  Pt to be able to walk with no AD for >/= 45 min for functional endurance needed for ADLs including and community ambulation Baseline:  Goal status: INITIAL  5.  Pt to be able to return to gardening, and hiking per her  personal goals with no limitations or pain Baseline:  Goal status: INITIAL  6.  Pt to be IND with all HEP and will be able to maintain and progress their current LOF IND.  Baseline:  Goal status: INITIAL   PLAN:  PT FREQUENCY: 2x/week  PT DURATION: 8 weeks  PLANNED INTERVENTIONS: 97110-Therapeutic exercises, 97530- Therapeutic  activity, [id number]- Neuromuscular re-education, [phone]- Self Care, [id number]- Manual therapy, [phone]- Gait training, [phone]- Aquatic Therapy, [phone]- Electrical stimulation (unattended), [phone]- Vasopneumatic device, Patient/Family education, Balance training, Stair training, Taping, Joint mobilization, Scar mobilization, Cryotherapy, and Moist heat  PLAN FOR NEXT SESSION: review/ update HEP. Knee ROM/ mobilization, quad activation and strengthening. Sit to stand biomechanics, TUG assessment.    Mohamed Portlock PT, DPT, LAT, ATC  10/10/2024  1:06 PM      "

## 2024-10-11 ENCOUNTER — Other Ambulatory Visit (HOSPITAL_COMMUNITY): Payer: Self-pay

## 2024-10-11 MED ORDER — OXYCODONE HCL 5 MG PO TABS
5.0000 mg | ORAL_TABLET | ORAL | 0 refills | Status: AC | PRN
  Filled 2024-10-11: qty 42, 7d supply, fill #0

## 2024-10-12 ENCOUNTER — Other Ambulatory Visit (HOSPITAL_COMMUNITY): Payer: Self-pay

## 2024-10-12 ENCOUNTER — Encounter: Payer: Self-pay | Admitting: Physical Therapy

## 2024-10-12 ENCOUNTER — Ambulatory Visit: Admitting: Physical Therapy

## 2024-10-12 DIAGNOSIS — M25561 Pain in right knee: Secondary | ICD-10-CM | POA: Diagnosis not present

## 2024-10-12 DIAGNOSIS — R2689 Other abnormalities of gait and mobility: Secondary | ICD-10-CM

## 2024-10-12 DIAGNOSIS — R600 Localized edema: Secondary | ICD-10-CM

## 2024-10-12 DIAGNOSIS — G8929 Other chronic pain: Secondary | ICD-10-CM

## 2024-10-12 DIAGNOSIS — M6281 Muscle weakness (generalized): Secondary | ICD-10-CM

## 2024-10-12 NOTE — Therapy (Signed)
 " OUTPATIENT PHYSICAL THERAPY TREATMENT   Patient Name: Virginia Matthews MRN: [id number] DOB:Dec 22, 1955, 69 y.o., female Today's Date: 10/12/2024  END OF SESSION:  PT End of Session - 10/12/24 1056     Visit Number 3    Number of Visits 17    Date for Recertification  11/30/24    Authorization Type MCR    Progress Note Due on Visit 10    PT Start Time 1057    PT Stop Time 1159    PT Time Calculation (min) 62 min    Activity Tolerance Patient tolerated treatment well    Behavior During Therapy [facility] for tasks assessed/performed            Past Medical History:  Diagnosis Date   Breast mass, left 06/2016   Carpal tunnel syndrome on both sides    GERD (gastroesophageal reflux disease)    Heart murmur    Hypertension    states under control with med., has been on med. x 3-4 yr.   LVH (left ventricular hypertrophy)    moderate, per echo 01/2016   Moderate aortic regurgitation 07/03/2020   Obesity    PONV (postoperative nausea and vomiting)    Psoriatic arthritis (HCC)    Rosacea    Thoracic ascending aortic aneurysm    Past Surgical History:  Procedure Laterality Date   BREAST EXCISIONAL BIOPSY Left    benign   COLONOSCOPY WITH PROPOFOL   09/12/2015   LAPAROSCOPIC ENDOMETRIOSIS FULGURATION     MICRODISCECTOMY LUMBAR Right 07/16/2011   L4-5   PELVIC LAPAROSCOPY     DIAG LAP   RADIOACTIVE SEED GUIDED EXCISIONAL BREAST BIOPSY Left 07/16/2016   Procedure: LEFT RADIOACTIVE SEED GUIDED EXCISIONAL BREAST BIOPSY;  Surgeon: Donnice Bury, MD;  Location: [facility];  Service: General;  Laterality: Left;   TOTAL KNEE ARTHROPLASTY Right 10/03/2024   Patient Active Problem List   Diagnosis Date Noted   Stress incontinence 05/03/2024   Bilateral hand swelling 03/13/2024   Bursitis of hip, right 03/13/2024   Long term current use of therapeutic drug 03/13/2024   Primary osteoarthritis of both knees 03/13/2024   Unilateral primary osteoarthritis, right knee  09/13/2023   Hamstring tendonitis of right thigh 10/22/2022   Primary localized osteoarthrosis of multiple sites 05/25/2022   Moderate aortic regurgitation 07/03/2020   Right knee pain 07/01/2017   Unilateral primary osteoarthritis, left knee 04/13/2017   Psoriatic arthritis (HCC) 04/17/2016   LVH (left ventricular hypertrophy) 02/11/2016   Ascending aortic aneurysm 02/11/2016   Carotid bruit 12/31/2015   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 09/10/2015   Left wrist pain 07/05/2015   Bilateral hip pain 07/05/2015   Leg size inequality 07/05/2015   Hyperlipidemia 06/25/2014   Right facial numbness 12/21/2013   Anxiety and depression 05/13/2012   Physical exam, routine 05/13/2012   GERD (gastroesophageal reflux disease) 05/13/2012   Seasonal allergies    PCO (polycystic ovaries)    Ruptured lumbar disc    Endometriosis    HYPERTENSION, BENIGN ESSENTIAL 05/01/2010   NONSPEC ELEVATION OF LEVELS OF TRANSAMINASE/LDH 03/14/2009   Severe obesity (BMI >= 40) (HCC) 02/11/2009   Allergic rhinitis 02/11/2009   Abdominal pain 02/11/2009    PCP: Mahlon Comer BRAVO, MD   REFERRING PROVIDER: Melodi Lerner, MD   REFERRING DIAG: Presence of right artificial knee joint [Z96.651]   THERAPY DIAG:  Chronic pain of right knee  Muscle weakness (generalized)  Localized edema  Other abnormalities of gait and mobility  Rationale for Evaluation and Treatment: Rehabilitation  ONSET DATE: 10/03/2024 DOS  SUBJECTIVE:   SUBJECTIVE STATEMENT: 10/12/2024  I am feeling a little more tight and a little more sore today at an 8/10.  EVAL: Pt arrives to Pt s/p R TKA on 10/03/2024. She reports overall she is doing pretty good with walking around the house and keeping elevation with ice. The nerve block wore off the other day and note an increase in pain but overall been doing pretty good with her / standing and activity.   PERTINENT HISTORY: See PMHx PAIN:  Are you  having pain? Yes: NPRS scale: 8/10 Pain location: around the knee Pain description: aching / stabbing.  Aggravating factors: sit to standing, end of the day.  Relieving factors: pain meds, ice, elevation  PRECAUTIONS: None  RED FLAGS: None   WEIGHT BEARING RESTRICTIONS: No  FALLS:  Has patient fallen in last 6 months? 1 as a result of tripping.   LIVING ENVIRONMENT: Lives with: lives alone Lives in: House/apartment Stairs: No Has following equipment at home: Vannie - 2 wheeled, Grab bars, and lift chair.   OCCUPATION: retired   PLOF: Independent with basic ADLs  PATIENT GOALS: Walking on multiple surfaces, and hiking, stairs Gardening.    OBJECTIVE:  Note: Objective measures were completed at Evaluation unless otherwise noted.  DIAGNOSTIC FINDINGS:  See chart  PATIENT SURVEYS:  LEFS  Extreme difficulty/unable (0), Quite a bit of difficulty (1), Moderate difficulty (2), Little difficulty (3), No difficulty (4) Survey date:  10/05/2024  Any of your usual work, housework or school activities 1  2. Usual hobbies, recreational or sporting activities 0  3. Getting into/out of the bath 1  4. Walking between rooms 3  5. Putting on socks/shoes 1  6. Squatting  1  7. Lifting an object, like a bag of groceries from the floor 2  8. Performing light activities around your home 2  9. Performing heavy activities around your home 0  10. Getting into/out of a car 1  11. Walking 2 blocks 1  12. Walking 1 mile 0  13. Going up/down 10 stairs (1 flight) 0  14. Standing for 1 hour 0  15.  sitting for 1 hour 3  16. Running on even ground 0  17. Running on uneven ground 0  18. Making sharp turns while running fast 0  19. Hopping  0  20. Rolling over in bed 2  Score total:  18/80     COGNITION: Overall cognitive status: Within functional limits for tasks assessed     SENSATION: Not tested   POSTURE: rounded shoulders and forward head  PALPATION: Gross tenderness around  the knee.   LOWER EXTREMITY ROM:  Active ROM Right eval Left eval Right 10/10/2024  Hip flexion     Hip extension     Hip abduction     Hip adduction     Hip internal rotation     Hip external rotation     Knee flexion 90  88  Knee extension 15    Ankle dorsiflexion     Ankle plantarflexion     Ankle inversion     Ankle eversion      (Blank rows = not tested)  LOWER EXTREMITY MMT:  MMT Right eval Left eval  Hip flexion    Hip extension    Hip abduction    Hip adduction    Hip internal rotation    Hip external rotation    Knee flexion  Knee extension    Ankle dorsiflexion    Ankle plantarflexion    Ankle inversion    Ankle eversion     (Blank rows = not tested)  LOWER EXTREMITY SPECIAL TESTS:  N/A  FUNCTIONAL TESTS:  1/13/2026Timed up and go (TUG): 30 sec with RW  GAIT: Distance walked: waiting area to tx room  Assistive device utilized: Walker - 2 wheeled Level of assistance: Modified independence Comments: decreased stride bil                                                                                                                                 TREATMENT:  [facility] Adult PT Treatment:                                                DATE: 10/12/2024 Seated tbiofemoral distraction with flexion Applied wet to dry xenoform and gauze with ace wrap Nu-step L 5 x 5 min UE/LE Sit to stand 2 x 10 TKE with ball against the wall 2 x 10  Game ready x 15 min, med pressure, 34 degrees.   [facility] Adult PT Treatment:                                                DATE: 10/10/2024 Tibiofemoral AP mobs grade III with active flexion between  Seated tbiofemoral distraction with flexion Seated hamstring curl 1 x 10 with RTB LAQ  1 x 8  Recumbent bike x 10 1/2 revolutions and 8 full reverse revolutions Gait training with RW utilizing heel strike/ toe off and equal stride Game ready x 15 min, med pressure, 34 degrees.  Reviewed and updated HEP today.   [facility] Adult PT  Treatment:                                                DATE: 10/06/2023 Quad set 5 x 5 sec hold  SAQ 1 x 10  Heel slides 1 x 10 with strap holding 5 sec at end range Glute set 1 x 10 holding 5 sec Game ready x 10 min, med pressure, 34 degrees Provided initial HEP  PATIENT EDUCATION:  Education details: evaluation findings, POC, goals, HEP with proper form/ rationale.  Person educated: Patient Education method: Explanation and Handouts Education comprehension: verbalized understanding  HOME EXERCISE PROGRAM: Access Code: [email or web address] URL: [email or web address] Date: 10/10/2024 Prepared by: Joneen Fresh  Program Notes walking utilizing heel strike/ toe off on both sides and making step length equal.   Exercises - Supine Quad Set  -  3 x daily - 7 x weekly - 2 sets - 10 reps - holding 5 sec hold - Supine Heel Slide with Strap  - 2-3 x daily - 7 x weekly - 2 sets - 10 reps - 5 sec hold - Seated Heel Slide  - 2-3 x daily - 7 x weekly - 2 sets - 10 reps - 5 hold - Supine Gluteal Sets  - 1 x daily - 7 x weekly - 2 sets - 10 reps - 5 hold - Seated Hamstring Stretch  - 1 x daily - 7 x weekly - 2 sets - 2 reps - 30 hold - Supine Hamstring Stretch with Strap  - 1 x daily - 7 x weekly - 2 sets - 2 reps - 30 hold - Seated Long Arc Quad  - 1 x daily - 7 x weekly - 2-3 sets - 10-12 reps  ASSESSMENT:  CLINICAL IMPRESSION: 10/12/2024 Mrs Shawntelle arrives to session today noting increased soreness since the last session and having trouble finding a position of comfort. Worked on STW along the quad to assist with knee ROM to maximize flexion and reduce pain. She had some bleeding that had made it through her gauze, applied a wet to dry gauze and wrapped with an ace wrap. Continued working on quad activation and sit to stand biomechanics. Continued use of Game ready end of session to reduce pain and swelling.   EVAL: Patient is a 69 y.o. F who was seen today for physical therapy  evaluation and treatment for dx of R TKA on 10/03/2024. Since surgery she notes pain has increased some as a result of the her nerve block wearing off. She current has a total arc of ROM of 15 - 90 degrees actively.  She ambulates with RW utilizing short stride steps bil. She was able to perform all execises today and ice was applied end of session to help with pain and swelling. She would benefit from physical therapy to decrease R knee pain/ swelling, promote ROM and strength, maximize gait efficiency and overall function by addressing the deficits listed.   OBJECTIVE IMPAIRMENTS: Abnormal gait, decreased activity tolerance, decreased balance, difficulty walking, decreased ROM, decreased strength, increased edema, postural dysfunction, obesity, and pain.   ACTIVITY LIMITATIONS: lifting, bending, standing, squatting, transfers, and locomotion level  PARTICIPATION LIMITATIONS: community activity and yard work  PERSONAL FACTORS: 1-2 comorbidities: Psoriatic arthritis, and obesity are also affecting patient's functional outcome.   REHAB POTENTIAL: Good  CLINICAL DECISION MAKING: Evolving/moderate complexity  EVALUATION COMPLEXITY: Moderate   GOALS: Goals reviewed with patient? Yes  SHORT TERM GOALS: Target date: 11/02/2024  Pt to be ind with initial HEP for therapeutic progression  Baseline: Goal status: INITIAL  2.  Pt to increase R knee total arc ROM to >/= 10 - 110 for functional progression Baseline:  Goal status: INITIAL  3.  Pt to be able to ambulate with a SPC or LRAD demonstrating an efficeint gait pattern with reporting of pain </= 5/10 max Baseline:  Goal status: INITIAL  4.  Improve LEFS score to >/= 30/80 to demonstrate improving function and mobility.  Baseline:  Goal status: INITIAL    LONG TERM GOALS: Target date: 11/30/2024   Pt to increase R knee total arc ROM to >/= 5 - 120 with </= 2/10 max pain for functional ROM required for ADLs Baseline:  Goal status:  INITIAL  2.  Pt to increase RLE gross strength to >/= 4/5 to promote both knee and hips  stability with walking/ standing Baseline:  Goal status: INITIAL  3.  Improve LEFS score to > /= 65 /80 to demo overal functional improvement Baseline:  Goal status: INITIAL  4.  Pt to be able to walk with no AD for >/= 45 min for functional endurance needed for ADLs including and community ambulation Baseline:  Goal status: INITIAL  5.  Pt to be able to return to gardening, and hiking per her personal goals with no limitations or pain Baseline:  Goal status: INITIAL  6.  Pt to be IND with all HEP and will be able to maintain and progress their current LOF IND.  Baseline:  Goal status: INITIAL   PLAN:  PT FREQUENCY: 2x/week  PT DURATION: 8 weeks  PLANNED INTERVENTIONS: 97110-Therapeutic exercises, 97530- Therapeutic activity, [id number]- Neuromuscular re-education, 97535- Self Care, [id number]- Manual therapy, [phone]- Gait training, [phone]- Aquatic Therapy, [phone]- Electrical stimulation (unattended), [id number]- Vasopneumatic device, Patient/Family education, Balance training, Stair training, Taping, Joint mobilization, Scar mobilization, Cryotherapy, and Moist heat  PLAN FOR NEXT SESSION: review/ update HEP. Knee ROM/ mobilization, quad activation and strengthening. Sit to stand biomechanics, TUG assessment.    Oksana Deberry PT, DPT, LAT, ATC  10/12/24  12:03 PM      "

## 2024-10-17 ENCOUNTER — Encounter: Payer: Self-pay | Admitting: Physical Therapy

## 2024-10-17 ENCOUNTER — Other Ambulatory Visit: Payer: Self-pay

## 2024-10-17 ENCOUNTER — Ambulatory Visit: Admitting: Physical Therapy

## 2024-10-17 ENCOUNTER — Other Ambulatory Visit (HOSPITAL_COMMUNITY): Payer: Self-pay

## 2024-10-17 DIAGNOSIS — R2689 Other abnormalities of gait and mobility: Secondary | ICD-10-CM

## 2024-10-17 DIAGNOSIS — M6281 Muscle weakness (generalized): Secondary | ICD-10-CM

## 2024-10-17 DIAGNOSIS — M25561 Pain in right knee: Secondary | ICD-10-CM | POA: Diagnosis not present

## 2024-10-17 DIAGNOSIS — G8929 Other chronic pain: Secondary | ICD-10-CM

## 2024-10-17 DIAGNOSIS — R600 Localized edema: Secondary | ICD-10-CM

## 2024-10-17 MED ORDER — METHOCARBAMOL 500 MG PO TABS
500.0000 mg | ORAL_TABLET | Freq: Four times a day (QID) | ORAL | 0 refills | Status: AC | PRN
  Filled 2024-10-17: qty 40, 10d supply, fill #0

## 2024-10-17 NOTE — Therapy (Signed)
 " OUTPATIENT PHYSICAL THERAPY TREATMENT   Patient Name: ASANTE RITACCO MRN: [id number] DOB:05/29/1956, 69 y.o., female Today's Date: 10/17/2024  END OF SESSION:  PT End of Session - 10/17/24 1019     Visit Number 4    Number of Visits 17    Date for Recertification  11/30/24    Authorization Type MCR    Progress Note Due on Visit 10    PT Start Time 1015    PT Stop Time 1112    PT Time Calculation (min) 57 min    Activity Tolerance Patient tolerated treatment well             Past Medical History:  Diagnosis Date   Breast mass, left 06/2016   Carpal tunnel syndrome on both sides    GERD (gastroesophageal reflux disease)    Heart murmur    Hypertension    states under control with med., has been on med. x 3-4 yr.   LVH (left ventricular hypertrophy)    moderate, per echo 01/2016   Moderate aortic regurgitation 07/03/2020   Obesity    PONV (postoperative nausea and vomiting)    Psoriatic arthritis (HCC)    Rosacea    Thoracic ascending aortic aneurysm    Past Surgical History:  Procedure Laterality Date   BREAST EXCISIONAL BIOPSY Left    benign   COLONOSCOPY WITH PROPOFOL   09/12/2015   LAPAROSCOPIC ENDOMETRIOSIS FULGURATION     MICRODISCECTOMY LUMBAR Right 07/16/2011   L4-5   PELVIC LAPAROSCOPY     DIAG LAP   RADIOACTIVE SEED GUIDED EXCISIONAL BREAST BIOPSY Left 07/16/2016   Procedure: LEFT RADIOACTIVE SEED GUIDED EXCISIONAL BREAST BIOPSY;  Surgeon: Donnice Bury, MD;  Location: [facility];  Service: General;  Laterality: Left;   TOTAL KNEE ARTHROPLASTY Right 10/03/2024   Patient Active Problem List   Diagnosis Date Noted   Stress incontinence 05/03/2024   Bilateral hand swelling 03/13/2024   Bursitis of hip, right 03/13/2024   Long term current use of therapeutic drug 03/13/2024   Primary osteoarthritis of both knees 03/13/2024   Unilateral primary osteoarthritis, right knee 09/13/2023   Hamstring tendonitis of right thigh 10/22/2022    Primary localized osteoarthrosis of multiple sites 05/25/2022   Moderate aortic regurgitation 07/03/2020   Right knee pain 07/01/2017   Unilateral primary osteoarthritis, left knee 04/13/2017   Psoriatic arthritis (HCC) 04/17/2016   LVH (left ventricular hypertrophy) 02/11/2016   Ascending aortic aneurysm 02/11/2016   Carotid bruit 12/31/2015   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 09/10/2015   Left wrist pain 07/05/2015   Bilateral hip pain 07/05/2015   Leg size inequality 07/05/2015   Hyperlipidemia 06/25/2014   Right facial numbness 12/21/2013   Anxiety and depression 05/13/2012   Physical exam, routine 05/13/2012   GERD (gastroesophageal reflux disease) 05/13/2012   Seasonal allergies    PCO (polycystic ovaries)    Ruptured lumbar disc    Endometriosis    HYPERTENSION, BENIGN ESSENTIAL 05/01/2010   NONSPEC ELEVATION OF LEVELS OF TRANSAMINASE/LDH 03/14/2009   Severe obesity (BMI >= 40) (HCC) 02/11/2009   Allergic rhinitis 02/11/2009   Abdominal pain 02/11/2009    PCP: Mahlon Comer BRAVO, MD   REFERRING PROVIDER: Melodi Lerner, MD   REFERRING DIAG: Presence of right artificial knee joint [Z96.651]   THERAPY DIAG:  Chronic pain of right knee  Muscle weakness (generalized)  Localized edema  Other abnormalities of gait and mobility  Rationale for Evaluation and Treatment: Rehabilitation  ONSET DATE:  10/03/2024 DOS  SUBJECTIVE:   SUBJECTIVE STATEMENT: 10/17/2024 I am doing the exercises and the walking, I feel like I am getting better.  EVAL: Pt arrives to Pt s/p R TKA on 10/03/2024. She reports overall she is doing pretty good with walking around the house and keeping elevation with ice. The nerve block wore off the other day and note an increase in pain but overall been doing pretty good with her / standing and activity.   PERTINENT HISTORY: See PMHx PAIN:  Are you having pain? Yes: NPRS scale: 8/10 Pain location: around the  knee Pain description: aching / stabbing.  Aggravating factors: sit to standing, end of the day.  Relieving factors: pain meds, ice, elevation  PRECAUTIONS: None  RED FLAGS: None   WEIGHT BEARING RESTRICTIONS: No  FALLS:  Has patient fallen in last 6 months? 1 as a result of tripping.   LIVING ENVIRONMENT: Lives with: lives alone Lives in: House/apartment Stairs: No Has following equipment at home: Vannie - 2 wheeled, Grab bars, and lift chair.   OCCUPATION: retired   PLOF: Independent with basic ADLs  PATIENT GOALS: Walking on multiple surfaces, and hiking, stairs Gardening.    OBJECTIVE:  Note: Objective measures were completed at Evaluation unless otherwise noted.  DIAGNOSTIC FINDINGS:  See chart  PATIENT SURVEYS:  LEFS  Extreme difficulty/unable (0), Quite a bit of difficulty (1), Moderate difficulty (2), Little difficulty (3), No difficulty (4) Survey date:  10/05/2024  Any of your usual work, housework or school activities 1  2. Usual hobbies, recreational or sporting activities 0  3. Getting into/out of the bath 1  4. Walking between rooms 3  5. Putting on socks/shoes 1  6. Squatting  1  7. Lifting an object, like a bag of groceries from the floor 2  8. Performing light activities around your home 2  9. Performing heavy activities around your home 0  10. Getting into/out of a car 1  11. Walking 2 blocks 1  12. Walking 1 mile 0  13. Going up/down 10 stairs (1 flight) 0  14. Standing for 1 hour 0  15.  sitting for 1 hour 3  16. Running on even ground 0  17. Running on uneven ground 0  18. Making sharp turns while running fast 0  19. Hopping  0  20. Rolling over in bed 2  Score total:  18/80     COGNITION: Overall cognitive status: Within functional limits for tasks assessed     SENSATION: Not tested   POSTURE: rounded shoulders and forward head  PALPATION: Gross tenderness around the knee.   LOWER EXTREMITY ROM:  Active ROM Right eval  Left eval Right 10/10/2024 Right  10/17/2024  Hip flexion      Hip extension      Hip abduction      Hip adduction      Hip internal rotation      Hip external rotation      Knee flexion 90  88 A90 - after tx103  Knee extension 15   10  Ankle dorsiflexion      Ankle plantarflexion      Ankle inversion      Ankle eversion       (Blank rows = not tested)  LOWER EXTREMITY MMT:  MMT Right eval Left eval  Hip flexion    Hip extension    Hip abduction    Hip adduction    Hip internal rotation    Hip external  rotation    Knee flexion    Knee extension    Ankle dorsiflexion    Ankle plantarflexion    Ankle inversion    Ankle eversion     (Blank rows = not tested)  LOWER EXTREMITY SPECIAL TESTS:  N/A  FUNCTIONAL TESTS:  1/13/2026Timed up and go (TUG): 30 sec with RW  GAIT: Distance walked: waiting area to tx room  Assistive device utilized: Walker - 2 wheeled Level of assistance: Modified independence Comments: decreased stride bil                                                                                                                                 TREATMENT:  [facility] Adult PT Treatment:                                                DATE: 10/17/24 Recumbent bike x 5 min in reverse full revolutions Tibiofemoral mobs grade III- IV flexing knee to max flexion between bouts Bridges with foot blocked. 2 x 10 LAQ with 3# 3 x 10   Seated hamstring curl 3 x 10 with RTB Seated marching with RTB around foot for sustained DF 3 x 20 alternating L/R SLR with sustained quad set 3 x 10  Game ready x 103/ min, med pressure, 34 degrees.  Reviewed and updated HEP today  [facility] Adult PT Treatment:                                                DATE: 10/12/2024 Seated tbiofemoral distraction with flexion Applied wet to dry xenoform and gauze with ace wrap Nu-step L 5 x 5 min UE/LE Sit to stand 2 x 10 TKE with ball against the wall 2 x 10  Game ready x 15 min, med pressure, 34  degrees.   [facility] Adult PT Treatment:                                                DATE: 10/10/2024 Tibiofemoral AP mobs grade III with active flexion between  Seated tbiofemoral distraction with flexion Seated hamstring curl 1 x 10 with RTB LAQ  1 x 8  Recumbent bike x 10 1/2 revolutions and 8 full reverse revolutions Gait training with RW utilizing heel strike/ toe off and equal stride Game ready x 15 min, med pressure, 34 degrees.  Reviewed and updated HEP today.   [facility] Adult PT Treatment:  DATE: 10/06/2023 Quad set 5 x 5 sec hold  SAQ 1 x 10  Heel slides 1 x 10 with strap holding 5 sec at end range Glute set 1 x 10 holding 5 sec Game ready x 10 min, med pressure, 34 degrees Provided initial HEP  PATIENT EDUCATION:  Education details: evaluation findings, POC, goals, HEP with proper form/ rationale.  Person educated: Patient Education method: Explanation and Handouts Education comprehension: verbalized understanding  HOME EXERCISE PROGRAM: Access Code: [email or web address] URL: [email or web address] Date: 10/17/2024 Prepared by: Joneen Fresh  Program Notes walking utilizing heel strike/ toe off on both sides and making step length equal.   Exercises - Supine Quad Set  - 3 x daily - 7 x weekly - 2 sets - 10 reps - holding 5 sec hold - Supine Heel Slide with Strap  - 2-3 x daily - 7 x weekly - 2 sets - 10 reps - 5 sec hold - Seated Heel Slide  - 2-3 x daily - 7 x weekly - 2 sets - 10 reps - 5 hold - Supine Gluteal Sets  - 1 x daily - 7 x weekly - 2 sets - 10 reps - 5 hold - Seated Hamstring Stretch  - 1 x daily - 7 x weekly - 2 sets - 2 reps - 30 hold - Supine Hamstring Stretch with Strap  - 1 x daily - 7 x weekly - 2 sets - 2 reps - 30 hold - Seated Long Arc Quad  - 1 x daily - 7 x weekly - 2-3 sets - 10-12 reps - Active Straight Leg Raise with Quad Set  - 1 x daily - 7 x weekly - 3 sets - 10 reps - Seated Hamstring Curls  with Resistance  - 1 x daily - 7 x weekly - 3 sets - 10-15 reps  ASSESSMENT:  CLINICAL IMPRESSION: 10/17/2024 Mrs Elenbaas is making steady progress with PT. She continues to noted fluctuating pain and stiffness but has been compliant with her HEP as well as elevation and ice. She arrived today with total arc of AROM 10 - 90. Following manual and exercise flexion increased to 103 degrees. She was able to perform and complete all prescribed exercises. Discussed continued challenging with exercise at home as she makes progress the gained ROM gets easier to maintain. Continued use of game ready end of session to calm down swelling and soreness incurred during session.   EVAL: Patient is a 69 y.o. F who was seen today for physical therapy evaluation and treatment for dx of R TKA on 10/03/2024. Since surgery she notes pain has increased some as a result of the her nerve block wearing off. She current has a total arc of ROM of 15 - 90 degrees actively.  She ambulates with RW utilizing short stride steps bil. She was able to perform all execises today and ice was applied end of session to help with pain and swelling. She would benefit from physical therapy to decrease R knee pain/ swelling, promote ROM and strength, maximize gait efficiency and overall function by addressing the deficits listed.   OBJECTIVE IMPAIRMENTS: Abnormal gait, decreased activity tolerance, decreased balance, difficulty walking, decreased ROM, decreased strength, increased edema, postural dysfunction, obesity, and pain.   ACTIVITY LIMITATIONS: lifting, bending, standing, squatting, transfers, and locomotion level  PARTICIPATION LIMITATIONS: community activity and yard work  PERSONAL FACTORS: 1-2 comorbidities: Psoriatic arthritis, and obesity are also affecting patient's functional outcome.   REHAB POTENTIAL: Good  CLINICAL DECISION MAKING: Evolving/moderate  complexity  EVALUATION COMPLEXITY: Moderate   GOALS: Goals reviewed  with patient? Yes  SHORT TERM GOALS: Target date: 11/02/2024  Pt to be ind with initial HEP for therapeutic progression  Baseline: Goal status: INITIAL  2.  Pt to increase R knee total arc ROM to >/= 10 - 110 for functional progression Baseline:  Goal status: INITIAL  3.  Pt to be able to ambulate with a SPC or LRAD demonstrating an efficeint gait pattern with reporting of pain </= 5/10 max Baseline:  Goal status: INITIAL  4.  Improve LEFS score to >/= 30/80 to demonstrate improving function and mobility.  Baseline:  Goal status: INITIAL    LONG TERM GOALS: Target date: 11/30/2024   Pt to increase R knee total arc ROM to >/= 5 - 120 with </= 2/10 max pain for functional ROM required for ADLs Baseline:  Goal status: INITIAL  2.  Pt to increase RLE gross strength to >/= 4/5 to promote both knee and hips stability with walking/ standing Baseline:  Goal status: INITIAL  3.  Improve LEFS score to > /= 65 /80 to demo overal functional improvement Baseline:  Goal status: INITIAL  4.  Pt to be able to walk with no AD for >/= 45 min for functional endurance needed for ADLs including and community ambulation Baseline:  Goal status: INITIAL  5.  Pt to be able to return to gardening, and hiking per her personal goals with no limitations or pain Baseline:  Goal status: INITIAL  6.  Pt to be IND with all HEP and will be able to maintain and progress their current LOF IND.  Baseline:  Goal status: INITIAL   PLAN:  PT FREQUENCY: 2x/week  PT DURATION: 8 weeks  PLANNED INTERVENTIONS: 97110-Therapeutic exercises, 97530- Therapeutic activity, [id number]- Neuromuscular re-education, 97535- Self Care, [id number]- Manual therapy, [phone]- Gait training, [phone]- Aquatic Therapy, [phone]- Electrical stimulation (unattended), [id number]- Vasopneumatic device, Patient/Family education, Balance training, Stair training, Taping, Joint mobilization, Scar mobilization, Cryotherapy, and Moist heat  PLAN FOR NEXT  SESSION: review/ update HEP. Knee ROM/ mobilization, quad activation and strengthening. Sit to stand biomechanics,    Calhoun Reichardt PT, DPT, LAT, ATC  10/17/24  11:27 AM      "

## 2024-10-19 ENCOUNTER — Ambulatory Visit: Admitting: Physical Therapy

## 2024-10-19 ENCOUNTER — Other Ambulatory Visit (HOSPITAL_COMMUNITY): Payer: Self-pay

## 2024-10-19 ENCOUNTER — Encounter: Payer: Self-pay | Admitting: Physical Therapy

## 2024-10-19 DIAGNOSIS — M6281 Muscle weakness (generalized): Secondary | ICD-10-CM

## 2024-10-19 DIAGNOSIS — M25561 Pain in right knee: Secondary | ICD-10-CM | POA: Diagnosis not present

## 2024-10-19 DIAGNOSIS — R600 Localized edema: Secondary | ICD-10-CM

## 2024-10-19 DIAGNOSIS — G8929 Other chronic pain: Secondary | ICD-10-CM

## 2024-10-19 NOTE — Therapy (Signed)
 " OUTPATIENT PHYSICAL THERAPY TREATMENT   Patient Name: Virginia Matthews MRN: [id number] DOB:Mar 10, 1956, 69 y.o., female Today's Date: 10/19/2024  END OF SESSION:  PT End of Session - 10/19/24 1020     Visit Number 5    Number of Visits 17    Date for Recertification  11/30/24    Authorization Type MCR    Progress Note Due on Visit 10    PT Start Time 1018    PT Stop Time 1109    PT Time Calculation (min) 51 min    Activity Tolerance Patient tolerated treatment well    Behavior During Therapy [facility] for tasks assessed/performed              Past Medical History:  Diagnosis Date   Breast mass, left 06/2016   Carpal tunnel syndrome on both sides    GERD (gastroesophageal reflux disease)    Heart murmur    Hypertension    states under control with med., has been on med. x 3-4 yr.   LVH (left ventricular hypertrophy)    moderate, per echo 01/2016   Moderate aortic regurgitation 07/03/2020   Obesity    PONV (postoperative nausea and vomiting)    Psoriatic arthritis (HCC)    Rosacea    Thoracic ascending aortic aneurysm    Past Surgical History:  Procedure Laterality Date   BREAST EXCISIONAL BIOPSY Left    benign   COLONOSCOPY WITH PROPOFOL   09/12/2015   LAPAROSCOPIC ENDOMETRIOSIS FULGURATION     MICRODISCECTOMY LUMBAR Right 07/16/2011   L4-5   PELVIC LAPAROSCOPY     DIAG LAP   RADIOACTIVE SEED GUIDED EXCISIONAL BREAST BIOPSY Left 07/16/2016   Procedure: LEFT RADIOACTIVE SEED GUIDED EXCISIONAL BREAST BIOPSY;  Surgeon: Donnice Bury, MD;  Location: [facility];  Service: General;  Laterality: Left;   TOTAL KNEE ARTHROPLASTY Right 10/03/2024   Patient Active Problem List   Diagnosis Date Noted   Stress incontinence 05/03/2024   Bilateral hand swelling 03/13/2024   Bursitis of hip, right 03/13/2024   Long term current use of therapeutic drug 03/13/2024   Primary osteoarthritis of both knees 03/13/2024   Unilateral primary osteoarthritis, right knee  09/13/2023   Hamstring tendonitis of right thigh 10/22/2022   Primary localized osteoarthrosis of multiple sites 05/25/2022   Moderate aortic regurgitation 07/03/2020   Right knee pain 07/01/2017   Unilateral primary osteoarthritis, left knee 04/13/2017   Psoriatic arthritis (HCC) 04/17/2016   LVH (left ventricular hypertrophy) 02/11/2016   Ascending aortic aneurysm 02/11/2016   Carotid bruit 12/31/2015   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 09/10/2015   Left wrist pain 07/05/2015   Bilateral hip pain 07/05/2015   Leg size inequality 07/05/2015   Hyperlipidemia 06/25/2014   Right facial numbness 12/21/2013   Anxiety and depression 05/13/2012   Physical exam, routine 05/13/2012   GERD (gastroesophageal reflux disease) 05/13/2012   Seasonal allergies    PCO (polycystic ovaries)    Ruptured lumbar disc    Endometriosis    HYPERTENSION, BENIGN ESSENTIAL 05/01/2010   NONSPEC ELEVATION OF LEVELS OF TRANSAMINASE/LDH 03/14/2009   Severe obesity (BMI >= 40) (HCC) 02/11/2009   Allergic rhinitis 02/11/2009   Abdominal pain 02/11/2009    PCP: Mahlon Comer BRAVO, MD   REFERRING PROVIDER: Melodi Lerner, MD   REFERRING DIAG: Presence of right artificial knee joint [Z96.651]   THERAPY DIAG:  Chronic pain of right knee  Muscle weakness (generalized)  Localized edema  Rationale for Evaluation and Treatment:  Rehabilitation  ONSET DATE: 10/03/2024 DOS  SUBJECTIVE:   SUBJECTIVE STATEMENT: 10/19/2024 I saw the MD and they are pleased with where I am. I think I did too much the other day and I felt some more quad spasms.  EVAL: Pt arrives to Pt s/p R TKA on 10/03/2024. She reports overall she is doing pretty good with walking around the house and keeping elevation with ice. The nerve block wore off the other day and note an increase in pain but overall been doing pretty good with her / standing and activity.   PERTINENT HISTORY: See PMHx PAIN:  Are you  having pain? Yes: NPRS scale: 1/10 with max of 5/10 Pain location: around the knee Pain description: aching / stabbing.  Aggravating factors: sit to standing, end of the day.  Relieving factors: pain meds, ice, elevation  PRECAUTIONS: None  RED FLAGS: None   WEIGHT BEARING RESTRICTIONS: No  FALLS:  Has patient fallen in last 6 months? 1 as a result of tripping.   LIVING ENVIRONMENT: Lives with: lives alone Lives in: House/apartment Stairs: No Has following equipment at home: Vannie - 2 wheeled, Grab bars, and lift chair.   OCCUPATION: retired   PLOF: Independent with basic ADLs  PATIENT GOALS: Walking on multiple surfaces, and hiking, stairs Gardening.    OBJECTIVE:  Note: Objective measures were completed at Evaluation unless otherwise noted.  DIAGNOSTIC FINDINGS:  See chart  PATIENT SURVEYS:  LEFS  Extreme difficulty/unable (0), Quite a bit of difficulty (1), Moderate difficulty (2), Little difficulty (3), No difficulty (4) Survey date:  10/05/2024  Any of your usual work, housework or school activities 1  2. Usual hobbies, recreational or sporting activities 0  3. Getting into/out of the bath 1  4. Walking between rooms 3  5. Putting on socks/shoes 1  6. Squatting  1  7. Lifting an object, like a bag of groceries from the floor 2  8. Performing light activities around your home 2  9. Performing heavy activities around your home 0  10. Getting into/out of a car 1  11. Walking 2 blocks 1  12. Walking 1 mile 0  13. Going up/down 10 stairs (1 flight) 0  14. Standing for 1 hour 0  15.  sitting for 1 hour 3  16. Running on even ground 0  17. Running on uneven ground 0  18. Making sharp turns while running fast 0  19. Hopping  0  20. Rolling over in bed 2  Score total:  18/80     COGNITION: Overall cognitive status: Within functional limits for tasks assessed     SENSATION: Not tested   POSTURE: rounded shoulders and forward head  PALPATION: Gross  tenderness around the knee.   LOWER EXTREMITY ROM:  Active ROM Right eval Left eval Right 10/10/2024 Right  10/17/2024 Right  10/19/2024  Hip flexion       Hip extension       Hip abduction       Hip adduction       Hip internal rotation       Hip external rotation       Knee flexion 90  88 A90 - after tx103 A 95 -   Knee extension 15   10 10   Ankle dorsiflexion       Ankle plantarflexion       Ankle inversion       Ankle eversion        (Blank rows = not tested)  LOWER EXTREMITY MMT:  MMT Right eval Left eval  Hip flexion    Hip extension    Hip abduction    Hip adduction    Hip internal rotation    Hip external rotation    Knee flexion    Knee extension    Ankle dorsiflexion    Ankle plantarflexion    Ankle inversion    Ankle eversion     (Blank rows = not tested)  LOWER EXTREMITY SPECIAL TESTS:  N/A  FUNCTIONAL TESTS:  1/13/2026Timed up and go (TUG): 30 sec with RW  GAIT: Distance walked: waiting area to tx room  Assistive device utilized: Environmental Consultant - 2 wheeled Level of assistance: Modified independence Comments: decreased stride bil                                                                                                                                 TREATMENT:  [facility] Adult PT Treatment:                                                DATE: 10/19/2024 Tibiofemoral mobs grade III- IV flexing knee to max flexion between bouts Tibiofemoral flexion with popliteal space blocked with gentle over pressure Sit to stand with LLE advanced slightly Involuntary quad contraction in standing with weight shift and ankle DF Recumbent bike in reverse x 6 min full revolutions Leg press 1 x 12 40#, 1 x 12 60# Game ready x 103/ min, med pressure, 34 degrees.   [facility] Adult PT Treatment:                                                DATE: 10/17/24 Recumbent bike x 5 min in reverse full revolutions Tibiofemoral mobs grade III- IV flexing knee to max flexion between  bouts Bridges with foot blocked. 2 x 10 LAQ with 3# 3 x 10   Seated hamstring curl 3 x 10 with RTB Seated marching with RTB around foot for sustained DF 3 x 20 alternating L/R SLR with sustained quad set 3 x 10  Game ready x 103/ min, med pressure, 34 degrees.  Reviewed and updated HEP today  [facility] Adult PT Treatment:                                                DATE: 10/12/2024 Seated tbiofemoral distraction with flexion Applied wet to dry xenoform and gauze with ace wrap Nu-step L 5 x 5 min UE/LE Sit to stand 2 x 10 TKE with ball against the  wall 2 x 10  Game ready x 15 min, med pressure, 34 degrees.   [facility] Adult PT Treatment:                                                DATE: 10/10/2024 Tibiofemoral AP mobs grade III with active flexion between  Seated tbiofemoral distraction with flexion Seated hamstring curl 1 x 10 with RTB LAQ  1 x 8  Recumbent bike x 10 1/2 revolutions and 8 full reverse revolutions Gait training with RW utilizing heel strike/ toe off and equal stride Game ready x 15 min, med pressure, 34 degrees.  Reviewed and updated HEP today.   PATIENT EDUCATION:  Education details: evaluation findings, POC, goals, HEP with proper form/ rationale.  Person educated: Patient Education method: Explanation and Handouts Education comprehension: verbalized understanding  HOME EXERCISE PROGRAM: Access Code: [email or web address] URL: [email or web address] Date: 10/17/2024 Prepared by: Joneen Fresh  Program Notes walking utilizing heel strike/ toe off on both sides and making step length equal.   Exercises - Supine Quad Set  - 3 x daily - 7 x weekly - 2 sets - 10 reps - holding 5 sec hold - Supine Heel Slide with Strap  - 2-3 x daily - 7 x weekly - 2 sets - 10 reps - 5 sec hold - Seated Heel Slide  - 2-3 x daily - 7 x weekly - 2 sets - 10 reps - 5 hold - Supine Gluteal Sets  - 1 x daily - 7 x weekly - 2 sets - 10 reps - 5 hold - Seated Hamstring Stretch  - 1 x  daily - 7 x weekly - 2 sets - 2 reps - 30 hold - Supine Hamstring Stretch with Strap  - 1 x daily - 7 x weekly - 2 sets - 2 reps - 30 hold - Seated Long Arc Quad  - 1 x daily - 7 x weekly - 2-3 sets - 10-12 reps - Active Straight Leg Raise with Quad Set  - 1 x daily - 7 x weekly - 3 sets - 10 reps - Seated Hamstring Curls with Resistance  - 1 x daily - 7 x weekly - 3 sets - 10-15 reps  ASSESSMENT:  CLINICAL IMPRESSION: 10/19/2024 Mrs Yamaguchi reports following up at her MD's office since the last session and they are pleased with her progress. Continued working on knee ROM as swelling continues to have an impact on ROM. Continued working on quad activation and gross LE strengthening. She did very well with all exercises and is progressing appropriately. Continued use of game ready to calm down swelling and pain end of session. Plan to continue working on ROM and strength and gait training with SPC.   EVAL: Patient is a 69 y.o. F who was seen today for physical therapy evaluation and treatment for dx of R TKA on 10/03/2024. Since surgery she notes pain has increased some as a result of the her nerve block wearing off. She current has a total arc of ROM of 15 - 90 degrees actively.  She ambulates with RW utilizing short stride steps bil. She was able to perform all execises today and ice was applied end of session to help with pain and swelling. She would benefit from physical therapy to decrease R knee pain/ swelling, promote ROM and strength, maximize gait efficiency and  overall function by addressing the deficits listed.   OBJECTIVE IMPAIRMENTS: Abnormal gait, decreased activity tolerance, decreased balance, difficulty walking, decreased ROM, decreased strength, increased edema, postural dysfunction, obesity, and pain.   ACTIVITY LIMITATIONS: lifting, bending, standing, squatting, transfers, and locomotion level  PARTICIPATION LIMITATIONS: community activity and yard work  PERSONAL FACTORS: 1-2  comorbidities: Psoriatic arthritis, and obesity are also affecting patient's functional outcome.   REHAB POTENTIAL: Good  CLINICAL DECISION MAKING: Evolving/moderate complexity  EVALUATION COMPLEXITY: Moderate   GOALS: Goals reviewed with patient? Yes  SHORT TERM GOALS: Target date: 11/02/2024  Pt to be ind with initial HEP for therapeutic progression  Baseline: Goal status: INITIAL  2.  Pt to increase R knee total arc ROM to >/= 10 - 110 for functional progression Baseline:  Goal status: INITIAL  3.  Pt to be able to ambulate with a SPC or LRAD demonstrating an efficeint gait pattern with reporting of pain </= 5/10 max Baseline:  Goal status: INITIAL  4.  Improve LEFS score to >/= 30/80 to demonstrate improving function and mobility.  Baseline:  Goal status: INITIAL    LONG TERM GOALS: Target date: 11/30/2024   Pt to increase R knee total arc ROM to >/= 5 - 120 with </= 2/10 max pain for functional ROM required for ADLs Baseline:  Goal status: INITIAL  2.  Pt to increase RLE gross strength to >/= 4/5 to promote both knee and hips stability with walking/ standing Baseline:  Goal status: INITIAL  3.  Improve LEFS score to > /= 65 /80 to demo overal functional improvement Baseline:  Goal status: INITIAL  4.  Pt to be able to walk with no AD for >/= 45 min for functional endurance needed for ADLs including and community ambulation Baseline:  Goal status: INITIAL  5.  Pt to be able to return to gardening, and hiking per her personal goals with no limitations or pain Baseline:  Goal status: INITIAL  6.  Pt to be IND with all HEP and will be able to maintain and progress their current LOF IND.  Baseline:  Goal status: INITIAL   PLAN:  PT FREQUENCY: 2x/week  PT DURATION: 8 weeks  PLANNED INTERVENTIONS: 97110-Therapeutic exercises, 97530- Therapeutic activity, [id number]- Neuromuscular re-education, 97535- Self Care, [id number]- Manual therapy, [phone]- Gait training, [phone]-  Aquatic Therapy, [phone]- Electrical stimulation (unattended), [id number]- Vasopneumatic device, Patient/Family education, Balance training, Stair training, Taping, Joint mobilization, Scar mobilization, Cryotherapy, and Moist heat  PLAN FOR NEXT SESSION: review/ update HEP. Knee ROM/ mobilization, quad activation and strengthening. Sit to stand biomechanics, gait training with SPC in //, HEP  Thaila Bottoms PT, DPT, LAT, ATC  10/19/24  11:57 AM      "

## 2024-10-24 ENCOUNTER — Ambulatory Visit: Admitting: Physical Therapy

## 2024-10-26 ENCOUNTER — Ambulatory Visit: Admitting: Physical Therapy

## 2024-10-26 DIAGNOSIS — M25561 Pain in right knee: Secondary | ICD-10-CM | POA: Diagnosis not present

## 2024-10-26 DIAGNOSIS — R600 Localized edema: Secondary | ICD-10-CM

## 2024-10-26 DIAGNOSIS — M6281 Muscle weakness (generalized): Secondary | ICD-10-CM

## 2024-10-26 DIAGNOSIS — G8929 Other chronic pain: Secondary | ICD-10-CM

## 2024-10-26 NOTE — Therapy (Signed)
 " OUTPATIENT PHYSICAL THERAPY TREATMENT   Patient Name: Virginia Matthews MRN: [id number] DOB:03-01-1956, 68 y.o., female Today's Date: 10/26/2024  END OF SESSION:  PT End of Session - 10/26/24 1016     Visit Number 6    Number of Visits 17    Date for Recertification  11/30/24    Authorization Type MCR    Progress Note Due on Visit 10    PT Start Time 1017    PT Stop Time 1058    PT Time Calculation (min) 41 min    Activity Tolerance Patient tolerated treatment well    Behavior During Therapy [facility] for tasks assessed/performed               Past Medical History:  Diagnosis Date   Breast mass, left 06/2016   Carpal tunnel syndrome on both sides    GERD (gastroesophageal reflux disease)    Heart murmur    Hypertension    states under control with med., has been on med. x 3-4 yr.   LVH (left ventricular hypertrophy)    moderate, per echo 01/2016   Moderate aortic regurgitation 07/03/2020   Obesity    PONV (postoperative nausea and vomiting)    Psoriatic arthritis (HCC)    Rosacea    Thoracic ascending aortic aneurysm    Past Surgical History:  Procedure Laterality Date   BREAST EXCISIONAL BIOPSY Left    benign   COLONOSCOPY WITH PROPOFOL   09/12/2015   LAPAROSCOPIC ENDOMETRIOSIS FULGURATION     MICRODISCECTOMY LUMBAR Right 07/16/2011   L4-5   PELVIC LAPAROSCOPY     DIAG LAP   RADIOACTIVE SEED GUIDED EXCISIONAL BREAST BIOPSY Left 07/16/2016   Procedure: LEFT RADIOACTIVE SEED GUIDED EXCISIONAL BREAST BIOPSY;  Surgeon: Donnice Bury, MD;  Location: [facility];  Service: General;  Laterality: Left;   TOTAL KNEE ARTHROPLASTY Right 10/03/2024   Patient Active Problem List   Diagnosis Date Noted   Stress incontinence 05/03/2024   Bilateral hand swelling 03/13/2024   Bursitis of hip, right 03/13/2024   Long term current use of therapeutic drug 03/13/2024   Primary osteoarthritis of both knees 03/13/2024   Unilateral primary osteoarthritis, right  knee 09/13/2023   Hamstring tendonitis of right thigh 10/22/2022   Primary localized osteoarthrosis of multiple sites 05/25/2022   Moderate aortic regurgitation 07/03/2020   Right knee pain 07/01/2017   Unilateral primary osteoarthritis, left knee 04/13/2017   Psoriatic arthritis (HCC) 04/17/2016   LVH (left ventricular hypertrophy) 02/11/2016   Ascending aortic aneurysm 02/11/2016   Carotid bruit 12/31/2015   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 09/10/2015   Left wrist pain 07/05/2015   Bilateral hip pain 07/05/2015   Leg size inequality 07/05/2015   Hyperlipidemia 06/25/2014   Right facial numbness 12/21/2013   Anxiety and depression 05/13/2012   Physical exam, routine 05/13/2012   GERD (gastroesophageal reflux disease) 05/13/2012   Seasonal allergies    PCO (polycystic ovaries)    Ruptured lumbar disc    Endometriosis    HYPERTENSION, BENIGN ESSENTIAL 05/01/2010   NONSPEC ELEVATION OF LEVELS OF TRANSAMINASE/LDH 03/14/2009   Severe obesity (BMI >= 40) (HCC) 02/11/2009   Allergic rhinitis 02/11/2009   Abdominal pain 02/11/2009    PCP: Mahlon Comer BRAVO, MD   REFERRING PROVIDER: Melodi Lerner, MD   REFERRING DIAG: Presence of right artificial knee joint [Z96.651]   THERAPY DIAG:  Chronic pain of right knee  Muscle weakness (generalized)  Localized edema  Rationale for Evaluation and  Treatment: Rehabilitation  ONSET DATE: 10/03/2024 DOS  SUBJECTIVE:   SUBJECTIVE STATEMENT: 10/26/2024 I am feeling alittle more sore in the quad which I think is more form exercise.  EVAL: Pt arrives to Pt s/p R TKA on 10/03/2024. She reports overall she is doing pretty good with walking around the house and keeping elevation with ice. The nerve block wore off the other day and note an increase in pain but overall been doing pretty good with her / standing and activity.   PERTINENT HISTORY: See PMHx PAIN:  Are you having pain? Yes: NPRS scale: 1/10 with  max of 5/10 Pain location: around the knee Pain description: aching / stabbing.  Aggravating factors: sit to standing, end of the day.  Relieving factors: pain meds, ice, elevation  PRECAUTIONS: None  RED FLAGS: None   WEIGHT BEARING RESTRICTIONS: No  FALLS:  Has patient fallen in last 6 months? 1 as a result of tripping.   LIVING ENVIRONMENT: Lives with: lives alone Lives in: House/apartment Stairs: No Has following equipment at home: Vannie - 2 wheeled, Grab bars, and lift chair.   OCCUPATION: retired   PLOF: Independent with basic ADLs  PATIENT GOALS: Walking on multiple surfaces, and hiking, stairs Gardening.    OBJECTIVE:  Note: Objective measures were completed at Evaluation unless otherwise noted.  DIAGNOSTIC FINDINGS:  See chart  PATIENT SURVEYS:  LEFS  Extreme difficulty/unable (0), Quite a bit of difficulty (1), Moderate difficulty (2), Little difficulty (3), No difficulty (4) Survey date:  10/05/2024  Any of your usual work, housework or school activities 1  2. Usual hobbies, recreational or sporting activities 0  3. Getting into/out of the bath 1  4. Walking between rooms 3  5. Putting on socks/shoes 1  6. Squatting  1  7. Lifting an object, like a bag of groceries from the floor 2  8. Performing light activities around your home 2  9. Performing heavy activities around your home 0  10. Getting into/out of a car 1  11. Walking 2 blocks 1  12. Walking 1 mile 0  13. Going up/down 10 stairs (1 flight) 0  14. Standing for 1 hour 0  15.  sitting for 1 hour 3  16. Running on even ground 0  17. Running on uneven ground 0  18. Making sharp turns while running fast 0  19. Hopping  0  20. Rolling over in bed 2  Score total:  18/80     COGNITION: Overall cognitive status: Within functional limits for tasks assessed     SENSATION: Not tested   POSTURE: rounded shoulders and forward head  PALPATION: Gross tenderness around the knee.   LOWER  EXTREMITY ROM:  Active ROM Right eval Left eval Right 10/10/2024 Right  10/17/2024 Right  10/19/2024 Right 10/26/2024  Hip flexion        Hip extension        Hip abduction        Hip adduction        Hip internal rotation        Hip external rotation        Knee flexion 90  88 A90 - after tx103 A 95 -  102  Knee extension 15   10 10 8   Ankle dorsiflexion        Ankle plantarflexion        Ankle inversion        Ankle eversion         (Blank rows =  not tested)  LOWER EXTREMITY MMT:  MMT Right eval Left eval  Hip flexion    Hip extension    Hip abduction    Hip adduction    Hip internal rotation    Hip external rotation    Knee flexion    Knee extension    Ankle dorsiflexion    Ankle plantarflexion    Ankle inversion    Ankle eversion     (Blank rows = not tested)  LOWER EXTREMITY SPECIAL TESTS:  N/A  FUNCTIONAL TESTS:  1/13/2026Timed up and go (TUG): 30 sec with RW  GAIT: Distance walked: waiting area to tx room  Assistive device utilized: Environmental Consultant - 2 wheeled Level of assistance: Modified independence Comments: decreased stride bil                                                                                                                                 TREATMENT:  [facility] Adult PT Treatment:                                                DATE: 10/26/2024 MTPR along the R rectus femoris Recumbent bike full revolutions forward x 6 min  Thomas test stretch 2 x 30 sec PNF contract/ relax Gait training with SPC in // with gait belt focusing on form/ reciprocal movement Gait training outside of bars with SPC 4 x 50 ft Reviewed HEP and updated thomas test stretch   [facility] Adult PT Treatment:                                                DATE: 10/19/2024 Tibiofemoral mobs grade III- IV flexing knee to max flexion between bouts Tibiofemoral flexion with popliteal space blocked with gentle over pressure Sit to stand with LLE advanced slightly Involuntary quad  contraction in standing with weight shift and ankle DF Recumbent bike in reverse x 6 min full revolutions Leg press 1 x 12 40#, 1 x 12 60# Game ready x 103/ min, med pressure, 34 degrees.   [facility] Adult PT Treatment:                                                DATE: 10/17/24 Recumbent bike x 5 min in reverse full revolutions Tibiofemoral mobs grade III- IV flexing knee to max flexion between bouts Bridges with foot blocked. 2 x 10 LAQ with 3# 3 x 10   Seated hamstring curl 3 x 10 with RTB Seated marching with RTB around foot for sustained DF 3  x 20 alternating L/R SLR with sustained quad set 3 x 10  Game ready x 103/ min, med pressure, 34 degrees.  Reviewed and updated HEP today  [facility] Adult PT Treatment:                                                DATE: 10/12/2024 Seated tbiofemoral distraction with flexion Applied wet to dry xenoform and gauze with ace wrap Nu-step L 5 x 5 min UE/LE Sit to stand 2 x 10 TKE with ball against the wall 2 x 10  Game ready x 15 min, med pressure, 34 degrees.   PATIENT EDUCATION:  Education details: evaluation findings, POC, goals, HEP with proper form/ rationale.  Person educated: Patient Education method: Explanation and Handouts Education comprehension: verbalized understanding  HOME EXERCISE PROGRAM: Access Code: [email or web address] URL: [email or web address] Date: 10/17/2024 Prepared by: Joneen Fresh  Program Notes walking utilizing heel strike/ toe off on both sides and making step length equal.   Exercises - Supine Quad Set  - 3 x daily - 7 x weekly - 2 sets - 10 reps - holding 5 sec hold - Supine Heel Slide with Strap  - 2-3 x daily - 7 x weekly - 2 sets - 10 reps - 5 sec hold - Seated Heel Slide  - 2-3 x daily - 7 x weekly - 2 sets - 10 reps - 5 hold - Supine Gluteal Sets  - 1 x daily - 7 x weekly - 2 sets - 10 reps - 5 hold - Seated Hamstring Stretch  - 1 x daily - 7 x weekly - 2 sets - 2 reps - 30 hold - Supine Hamstring  Stretch with Strap  - 1 x daily - 7 x weekly - 2 sets - 2 reps - 30 hold - Seated Long Arc Quad  - 1 x daily - 7 x weekly - 2-3 sets - 10-12 reps - Active Straight Leg Raise with Quad Set  - 1 x daily - 7 x weekly - 3 sets - 10 reps - Seated Hamstring Curls with Resistance  - 1 x daily - 7 x weekly - 3 sets - 10-15 reps  ASSESSMENT:  CLINICAL IMPRESSION: 10/26/2024 Virginia Matthews arrives to session noting some quad muscle soreness which she attributes to exercise at home which she has increased the reps of all her exercises. She is making good progress increasing her total arc ROM to 8 - 102 today. She was able to also do full forward revolutions with limited interruptions. Focused remainder of session on gait training with SPC with cues/ demonstration for [facility] placement and step length/ cadence. End of session she declined modalities.   EVAL: Patient is a 69 y.o. F who was seen today for physical therapy evaluation and treatment for dx of R TKA on 10/03/2024. Since surgery she notes pain has increased some as a result of the her nerve block wearing off. She current has a total arc of ROM of 15 - 90 degrees actively.  She ambulates with RW utilizing short stride steps bil. She was able to perform all execises today and ice was applied end of session to help with pain and swelling. She would benefit from physical therapy to decrease R knee pain/ swelling, promote ROM and strength, maximize gait efficiency and overall function by addressing the deficits listed.  OBJECTIVE IMPAIRMENTS: Abnormal gait, decreased activity tolerance, decreased balance, difficulty walking, decreased ROM, decreased strength, increased edema, postural dysfunction, obesity, and pain.   ACTIVITY LIMITATIONS: lifting, bending, standing, squatting, transfers, and locomotion level  PARTICIPATION LIMITATIONS: community activity and yard work  PERSONAL FACTORS: 1-2 comorbidities: Psoriatic arthritis, and obesity are also affecting  patient's functional outcome.   REHAB POTENTIAL: Good  CLINICAL DECISION MAKING: Evolving/moderate complexity  EVALUATION COMPLEXITY: Moderate   GOALS: Goals reviewed with patient? Yes  SHORT TERM GOALS: Target date: 11/02/2024  Pt to be ind with initial HEP for therapeutic progression  Baseline: Goal status: INITIAL  2.  Pt to increase R knee total arc ROM to >/= 10 - 110 for functional progression Baseline:  Goal status: INITIAL  3.  Pt to be able to ambulate with a SPC or LRAD demonstrating an efficeint gait pattern with reporting of pain </= 5/10 max Baseline:  Goal status: INITIAL  4.  Improve LEFS score to >/= 30/80 to demonstrate improving function and mobility.  Baseline:  Goal status: INITIAL    LONG TERM GOALS: Target date: 11/30/2024   Pt to increase R knee total arc ROM to >/= 5 - 120 with </= 2/10 max pain for functional ROM required for ADLs Baseline:  Goal status: INITIAL  2.  Pt to increase RLE gross strength to >/= 4/5 to promote both knee and hips stability with walking/ standing Baseline:  Goal status: INITIAL  3.  Improve LEFS score to > /= 65 /80 to demo overal functional improvement Baseline:  Goal status: INITIAL  4.  Pt to be able to walk with no AD for >/= 45 min for functional endurance needed for ADLs including and community ambulation Baseline:  Goal status: INITIAL  5.  Pt to be able to return to gardening, and hiking per her personal goals with no limitations or pain Baseline:  Goal status: INITIAL  6.  Pt to be IND with all HEP and will be able to maintain and progress their current LOF IND.  Baseline:  Goal status: INITIAL   PLAN:  PT FREQUENCY: 2x/week  PT DURATION: 8 weeks  PLANNED INTERVENTIONS: 97110-Therapeutic exercises, 97530- Therapeutic activity, [id number]- Neuromuscular re-education, 97535- Self Care, [id number]- Manual therapy, [phone]- Gait training, [phone]- Aquatic Therapy, [phone]- Electrical stimulation (unattended),  [id number]- Vasopneumatic device, Patient/Family education, Balance training, Stair training, Taping, Joint mobilization, Scar mobilization, Cryotherapy, and Moist heat  PLAN FOR NEXT SESSION: review/ update HEP. Knee ROM/ mobilization, quad activation and strengthening. Sit to stand biomechanics, gait training with SPC in //, HEP  Leona Pressly PT, DPT, LAT, ATC  10/26/24  11:13 AM      "

## 2024-10-31 ENCOUNTER — Encounter: Payer: Self-pay | Admitting: Physical Therapy

## 2024-10-31 ENCOUNTER — Ambulatory Visit: Admitting: Physical Therapy

## 2024-10-31 DIAGNOSIS — R600 Localized edema: Secondary | ICD-10-CM

## 2024-10-31 DIAGNOSIS — M6281 Muscle weakness (generalized): Secondary | ICD-10-CM

## 2024-10-31 DIAGNOSIS — G8929 Other chronic pain: Secondary | ICD-10-CM

## 2024-11-02 ENCOUNTER — Ambulatory Visit

## 2024-11-02 DIAGNOSIS — R600 Localized edema: Secondary | ICD-10-CM

## 2024-11-02 DIAGNOSIS — M6281 Muscle weakness (generalized): Secondary | ICD-10-CM

## 2024-11-02 DIAGNOSIS — G8929 Other chronic pain: Secondary | ICD-10-CM

## 2024-11-02 NOTE — Therapy (Signed)
 " OUTPATIENT PHYSICAL THERAPY TREATMENT   Patient Name: Virginia Matthews MRN: [id number] DOB:Oct 16, 1955, 69 y.o., female Today's Date: 11/02/2024  END OF SESSION:  PT End of Session - 11/02/24 1014     Visit Number 8    Number of Visits 17    Date for Recertification  11/30/24    Authorization Type MCR    Progress Note Due on Visit 10    PT Start Time 1015    PT Stop Time 1055    PT Time Calculation (min) 40 min    Activity Tolerance Patient tolerated treatment well    Behavior During Therapy [place] for tasks assessed/performed                 Past Medical History:  Diagnosis Date   Breast mass, left 06/2016   Carpal tunnel syndrome on both sides    GERD (gastroesophageal reflux disease)    Heart murmur    Hypertension    states under control with med., has been on med. x 3-4 yr.   LVH (left ventricular hypertrophy)    moderate, per echo 01/2016   Moderate aortic regurgitation 07/03/2020   Obesity    PONV (postoperative nausea and vomiting)    Psoriatic arthritis (HCC)    Rosacea    Thoracic ascending aortic aneurysm    Past Surgical History:  Procedure Laterality Date   BREAST EXCISIONAL BIOPSY Left    benign   COLONOSCOPY WITH PROPOFOL   09/12/2015   LAPAROSCOPIC ENDOMETRIOSIS FULGURATION     MICRODISCECTOMY LUMBAR Right 07/16/2011   L4-5   PELVIC LAPAROSCOPY     DIAG LAP   RADIOACTIVE SEED GUIDED EXCISIONAL BREAST BIOPSY Left 07/16/2016   Procedure: LEFT RADIOACTIVE SEED GUIDED EXCISIONAL BREAST BIOPSY;  Surgeon: Donnice Bury, MD;  Location: [facility];  Service: General;  Laterality: Left;   TOTAL KNEE ARTHROPLASTY Right 10/03/2024   Patient Active Problem List   Diagnosis Date Noted   Stress incontinence 05/03/2024   Bilateral hand swelling 03/13/2024   Bursitis of hip, right 03/13/2024   Long term current use of therapeutic drug 03/13/2024   Primary osteoarthritis of both knees 03/13/2024   Unilateral primary osteoarthritis, right  knee 09/13/2023   Hamstring tendonitis of right thigh 10/22/2022   Primary localized osteoarthrosis of multiple sites 05/25/2022   Moderate aortic regurgitation 07/03/2020   Right knee pain 07/01/2017   Unilateral primary osteoarthritis, left knee 04/13/2017   Psoriatic arthritis (HCC) 04/17/2016   LVH (left ventricular hypertrophy) 02/11/2016   Ascending aortic aneurysm 02/11/2016   Carotid bruit 12/31/2015   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 09/10/2015   Left wrist pain 07/05/2015   Bilateral hip pain 07/05/2015   Leg size inequality 07/05/2015   Hyperlipidemia 06/25/2014   Right facial numbness 12/21/2013   Anxiety and depression 05/13/2012   Physical exam, routine 05/13/2012   GERD (gastroesophageal reflux disease) 05/13/2012   Seasonal allergies    PCO (polycystic ovaries)    Ruptured lumbar disc    Endometriosis    HYPERTENSION, BENIGN ESSENTIAL 05/01/2010   NONSPEC ELEVATION OF LEVELS OF TRANSAMINASE/LDH 03/14/2009   Severe obesity (BMI >= 40) (HCC) 02/11/2009   Allergic rhinitis 02/11/2009   Abdominal pain 02/11/2009    PCP: Mahlon Comer BRAVO, MD   REFERRING PROVIDER: Melodi Lerner, MD   REFERRING DIAG: Presence of right artificial knee joint [Z96.651]   THERAPY DIAG:  Chronic pain of right knee  Muscle weakness (generalized)  Localized edema  Rationale for  Evaluation and Treatment: Rehabilitation  ONSET DATE: 10/03/2024 DOS  SUBJECTIVE:   SUBJECTIVE STATEMENT: 11/02/2024 Pt presents to PT with reports of R knee stiffness. Has been compliant with HEP with no adverse effect.   EVAL: Pt arrives to Pt s/p R TKA on 10/03/2024. She reports overall she is doing pretty good with walking around the house and keeping elevation with ice. The nerve block wore off the other day and note an increase in pain but overall been doing pretty good with her / standing and activity.   PERTINENT HISTORY: See PMHx PAIN:  Are you having pain? Yes:  NPRS scale: 0/10, at worst 6/10 Pain location: around the knee Pain description: aching / stabbing.  Aggravating factors: sit to standing, end of the day.  Relieving factors: pain meds, ice, elevation  PRECAUTIONS: None  RED FLAGS: None   WEIGHT BEARING RESTRICTIONS: No  FALLS:  Has patient fallen in last 6 months? 1 as a result of tripping.   LIVING ENVIRONMENT: Lives with: lives alone Lives in: House/apartment Stairs: No Has following equipment at home: Vannie - 2 wheeled, Grab bars, and lift chair.   OCCUPATION: retired   PLOF: Independent with basic ADLs  PATIENT GOALS: Walking on multiple surfaces, and hiking, stairs Gardening.    OBJECTIVE:  Note: Objective measures were completed at Evaluation unless otherwise noted.  DIAGNOSTIC FINDINGS:  See chart  PATIENT SURVEYS:  LEFS  Extreme difficulty/unable (0), Quite a bit of difficulty (1), Moderate difficulty (2), Little difficulty (3), No difficulty (4) Survey date:  10/05/2024  Any of your usual work, housework or school activities 1  2. Usual hobbies, recreational or sporting activities 0  3. Getting into/out of the bath 1  4. Walking between rooms 3  5. Putting on socks/shoes 1  6. Squatting  1  7. Lifting an object, like a bag of groceries from the floor 2  8. Performing light activities around your home 2  9. Performing heavy activities around your home 0  10. Getting into/out of a car 1  11. Walking 2 blocks 1  12. Walking 1 mile 0  13. Going up/down 10 stairs (1 flight) 0  14. Standing for 1 hour 0  15.  sitting for 1 hour 3  16. Running on even ground 0  17. Running on uneven ground 0  18. Making sharp turns while running fast 0  19. Hopping  0  20. Rolling over in bed 2  Score total:  18/80     COGNITION: Overall cognitive status: Within functional limits for tasks assessed     SENSATION: Not tested   POSTURE: rounded shoulders and forward head  PALPATION: Gross tenderness around the  knee.   LOWER EXTREMITY ROM:  Active ROM Right eval Left eval Right 10/10/2024 Right  10/17/2024 Right  10/19/2024 Right 10/26/2024 Right 10/31/24 Right 11/02/24  Hip flexion          Hip extension          Hip abduction          Hip adduction          Hip internal rotation          Hip external rotation          Knee flexion 90  88 A90 - after tx103 A 95 -  102 102 103  Knee extension 15   10 10 8 9 8   Ankle dorsiflexion          Ankle plantarflexion  Ankle inversion          Ankle eversion           (Blank rows = not tested)  LOWER EXTREMITY MMT:  MMT Right eval Left eval  Hip flexion    Hip extension    Hip abduction    Hip adduction    Hip internal rotation    Hip external rotation    Knee flexion    Knee extension    Ankle dorsiflexion    Ankle plantarflexion    Ankle inversion    Ankle eversion     (Blank rows = not tested)  LOWER EXTREMITY SPECIAL TESTS:  N/A  FUNCTIONAL TESTS:  1/13/2026Timed up and go (TUG): 30 sec with RW  GAIT: Distance walked: waiting area to tx room  Assistive device utilized: Environmental Consultant - 2 wheeled Level of assistance: Modified independence Comments: decreased stride bil                                                                                                                                 TREATMENT:  [facility] Adult PT Treatment:                                                DATE: 11/02/24 Rec bike working on revolutions for ROM x 5 min Fwd step flexion stretch x 10 - 5 hold R 6in Step up fwd 6in 1 UE 2x10 R leading LAQ 2x10 5# R  Seated hamstring stretch 2x30 R Supine QS with heel on block 2x10 - 5 hold Supine heel slide with strap 2x10 - 5 hold R FM squat B UE 2x10 LLLD stretch x 2 min R knee ext  [facility] Adult PT Treatment:                                                DATE: 10/31/24 Tibial ER with extension mobs grade III-IV Applied Steri-strip to top of incision x 3 strips Recumbent bike L1 x 6 min forward.   Standing hip abduction 2 x 15 bil Standing marching 1 x 10 alternating LR with HHA from freemotion Step up 1 x 10, modified to rocking forward and backward progressing to step up on 4 inch step LAQ with quad set to fatigue with 5# RLE only Reviewed and updated HEP.   [facility] Adult PT Treatment:                                                DATE: 10/26/2024 MTPR along the R rectus femoris  Recumbent bike full revolutions forward x 6 min  Thomas test stretch 2 x 30 sec PNF contract/ relax Gait training with SPC in // with gait belt focusing on form/ reciprocal movement Gait training outside of bars with SPC 4 x 50 ft Reviewed HEP and updated thomas test stretch   [facility] Adult PT Treatment:                                                DATE: 10/19/2024 Tibiofemoral mobs grade III- IV flexing knee to max flexion between bouts Tibiofemoral flexion with popliteal space blocked with gentle over pressure Sit to stand with LLE advanced slightly Involuntary quad contraction in standing with weight shift and ankle DF Recumbent bike in reverse x 6 min full revolutions Leg press 1 x 12 40#, 1 x 12 60# Game ready x 103/ min, med pressure, 34 degrees.   [facility] Adult PT Treatment:                                                DATE: 10/17/24 Recumbent bike x 5 min in reverse full revolutions Tibiofemoral mobs grade III- IV flexing knee to max flexion between bouts Bridges with foot blocked. 2 x 10 LAQ with 3# 3 x 10   Seated hamstring curl 3 x 10 with RTB Seated marching with RTB around foot for sustained DF 3 x 20 alternating L/R SLR with sustained quad set 3 x 10  Game ready x 103/ min, med pressure, 34 degrees.  Reviewed and updated HEP today  PATIENT EDUCATION:  Education details: evaluation findings, POC, goals, HEP with proper form/ rationale.  Person educated: Patient Education method: Explanation and Handouts Education comprehension: verbalized understanding  HOME EXERCISE PROGRAM: Access  Code: [email or web address] URL: [email or web address] Date: 11/02/2024 Prepared by: Alm Kingdom  Program Notes walking utilizing heel strike/ toe off on both sides and making step length equal.   Exercises - Supine Quad Set  - 3 x daily - 7 x weekly - 2 sets - 10 reps - holding 5 sec hold - Supine Heel Slide with Strap  - 2-3 x daily - 7 x weekly - 2 sets - 10 reps - 5 sec hold - Seated Heel Slide  - 2-3 x daily - 7 x weekly - 2 sets - 10 reps - 5 hold - Supine Gluteal Sets  - 1 x daily - 7 x weekly - 2 sets - 10 reps - 5 hold - Seated Hamstring Stretch  - 1 x daily - 7 x weekly - 2 sets - 2 reps - 30 hold - Supine Hamstring Stretch with Strap  - 1 x daily - 7 x weekly - 2 sets - 2 reps - 30 hold - Seated Long Arc Quad  - 1 x daily - 7 x weekly - 2-3 sets - 10-12 reps - Active Straight Leg Raise with Quad Set  - 1 x daily - 7 x weekly - 3 sets - 10 reps - Seated Hamstring Curls with Resistance  - 1 x daily - 7 x weekly - 3 sets - 10-15 reps - Modified Thomas Stretch  - 3 x daily - 7 x weekly - 1-2 sets - 2 reps - 30 -  60 seconds hold - Standing Hip Abduction with Counter Support  - 1 x daily - 7 x weekly - 3-4 sets - 10-15 reps - Standing Marching  - 1 x daily - 7 x weekly - 2-3 sets - 10 -15 reps - Mini Squat with Counter Support  - 1 x daily - 7 x weekly - 2-3 sets - 10-15 reps - Seated Knee Extension Stretch with Chair  - 2 x daily - 7 x weekly - 4-5 min hold  ASSESSMENT:  CLINICAL IMPRESSION: 11/02/2024: Pt was able to complete all prescribed exercises with no adverse effect with exception of slight nausea. Continues to show decrease in knee ROM and overall decrease in functional mobility. HEP updated for low load long duration stretching for R knee extension. Pt continues to benefit from skilled PT, will continue per POC.   EVAL: Patient is a 69 y.o. F who was seen today for physical therapy evaluation and treatment for dx of R TKA on 10/03/2024. Since surgery she notes pain has  increased some as a result of the her nerve block wearing off. She current has a total arc of ROM of 15 - 90 degrees actively.  She ambulates with RW utilizing short stride steps bil. She was able to perform all execises today and ice was applied end of session to help with pain and swelling. She would benefit from physical therapy to decrease R knee pain/ swelling, promote ROM and strength, maximize gait efficiency and overall function by addressing the deficits listed.   OBJECTIVE IMPAIRMENTS: Abnormal gait, decreased activity tolerance, decreased balance, difficulty walking, decreased ROM, decreased strength, increased edema, postural dysfunction, obesity, and pain.   ACTIVITY LIMITATIONS: lifting, bending, standing, squatting, transfers, and locomotion level  PARTICIPATION LIMITATIONS: community activity and yard work  PERSONAL FACTORS: 1-2 comorbidities: Psoriatic arthritis, and obesity are also affecting patient's functional outcome.   REHAB POTENTIAL: Good  CLINICAL DECISION MAKING: Evolving/moderate complexity  EVALUATION COMPLEXITY: Moderate   GOALS: Goals reviewed with patient? Yes  SHORT TERM GOALS: Target date: 11/02/2024  Pt to be ind with initial HEP for therapeutic progression  Baseline: Goal status: INITIAL  2.  Pt to increase R knee total arc ROM to >/= 10 - 110 for functional progression Baseline:  Goal status: INITIAL  3.  Pt to be able to ambulate with a SPC or LRAD demonstrating an efficeint gait pattern with reporting of pain </= 5/10 max Baseline:  Goal status: INITIAL  4.  Improve LEFS score to >/= 30/80 to demonstrate improving function and mobility.  Baseline:  Goal status: INITIAL    LONG TERM GOALS: Target date: 11/30/2024   Pt to increase R knee total arc ROM to >/= 5 - 120 with </= 2/10 max pain for functional ROM required for ADLs Baseline:  Goal status: INITIAL  2.  Pt to increase RLE gross strength to >/= 4/5 to promote both knee and hips  stability with walking/ standing Baseline:  Goal status: INITIAL  3.  Improve LEFS score to > /= 65 /80 to demo overal functional improvement Baseline:  Goal status: INITIAL  4.  Pt to be able to walk with no AD for >/= 45 min for functional endurance needed for ADLs including and community ambulation Baseline:  Goal status: INITIAL  5.  Pt to be able to return to gardening, and hiking per her personal goals with no limitations or pain Baseline:  Goal status: INITIAL  6.  Pt to be IND with all HEP and  will be able to maintain and progress their current LOF IND.  Baseline:  Goal status: INITIAL   PLAN:  PT FREQUENCY: 2x/week  PT DURATION: 8 weeks  PLANNED INTERVENTIONS: 97110-Therapeutic exercises, 97530- Therapeutic activity, [id number]- Neuromuscular re-education, 97535- Self Care, [id number]- Manual therapy, [phone]- Gait training, [phone]- Aquatic Therapy, [phone]- Electrical stimulation (unattended), [id number]- Vasopneumatic device, Patient/Family education, Balance training, Stair training, Taping, Joint mobilization, Scar mobilization, Cryotherapy, and Moist heat  PLAN FOR NEXT SESSION: review/ update HEP. Knee ROM/ mobilization, quad activation and strengthening. Sit to stand biomechanics, gait training with SPC in //, HEP assess STG.   Alm JAYSON Kingdom PT  11/02/24 11:29 AM       "

## 2024-11-03 ENCOUNTER — Ambulatory Visit: Admitting: Family Medicine

## 2024-11-07 ENCOUNTER — Ambulatory Visit: Admitting: Physical Therapy

## 2024-11-09 ENCOUNTER — Ambulatory Visit

## 2024-11-13 ENCOUNTER — Ambulatory Visit

## 2024-11-14 ENCOUNTER — Ambulatory Visit: Admitting: Physical Therapy

## 2024-11-16 ENCOUNTER — Ambulatory Visit

## 2024-11-17 ENCOUNTER — Ambulatory Visit: Admitting: Family Medicine

## 2024-11-21 ENCOUNTER — Ambulatory Visit

## 2024-11-23 ENCOUNTER — Ambulatory Visit

## 2024-12-27 ENCOUNTER — Other Ambulatory Visit (HOSPITAL_BASED_OUTPATIENT_CLINIC_OR_DEPARTMENT_OTHER)
